# Patient Record
Sex: Female | Born: 1940 | Race: Asian | Hispanic: No | Marital: Married | Smoking: Never smoker
Health system: Southern US, Community
[De-identification: ages and names within clinical notes are randomized; demographics above are authoritative.]

## PROBLEM LIST (undated history)

## (undated) DIAGNOSIS — D649 Anemia, unspecified: Secondary | ICD-10-CM

## (undated) DIAGNOSIS — Z95810 Presence of automatic (implantable) cardiac defibrillator: Secondary | ICD-10-CM

## (undated) DIAGNOSIS — I2510 Atherosclerotic heart disease of native coronary artery without angina pectoris: Secondary | ICD-10-CM

## (undated) DIAGNOSIS — I219 Acute myocardial infarction, unspecified: Secondary | ICD-10-CM

## (undated) DIAGNOSIS — I255 Ischemic cardiomyopathy: Secondary | ICD-10-CM

## (undated) DIAGNOSIS — T82198A Other mechanical complication of other cardiac electronic device, initial encounter: Principal | ICD-10-CM

## (undated) DIAGNOSIS — Z9289 Personal history of other medical treatment: Secondary | ICD-10-CM

## (undated) DIAGNOSIS — I471 Supraventricular tachycardia, unspecified: Secondary | ICD-10-CM

## (undated) DIAGNOSIS — K219 Gastro-esophageal reflux disease without esophagitis: Secondary | ICD-10-CM

## (undated) DIAGNOSIS — E785 Hyperlipidemia, unspecified: Secondary | ICD-10-CM

## (undated) DIAGNOSIS — I5022 Chronic systolic (congestive) heart failure: Secondary | ICD-10-CM

## (undated) HISTORY — DX: Anemia, unspecified: D64.9

## (undated) HISTORY — DX: Ischemic cardiomyopathy: I25.5

## (undated) HISTORY — PX: CARDIAC DEFIBRILLATOR PLACEMENT: SHX171

## (undated) HISTORY — PX: CATARACT EXTRACTION W/ INTRAOCULAR LENS  IMPLANT, BILATERAL: SHX1307

## (undated) HISTORY — DX: Supraventricular tachycardia, unspecified: I47.10

## (undated) HISTORY — DX: Other mechanical complication of other cardiac electronic device, initial encounter: T82.198A

## (undated) HISTORY — DX: Chronic systolic (congestive) heart failure: I50.22

## (undated) HISTORY — DX: Supraventricular tachycardia: I47.1

## (undated) HISTORY — DX: Hyperlipidemia, unspecified: E78.5

---

## 2003-01-15 ENCOUNTER — Ambulatory Visit (HOSPITAL_COMMUNITY): Admission: RE | Admit: 2003-01-15 | Discharge: 2003-01-15 | Payer: Self-pay | Admitting: Family Medicine

## 2004-02-18 ENCOUNTER — Inpatient Hospital Stay (HOSPITAL_COMMUNITY): Admission: AC | Admit: 2004-02-18 | Discharge: 2004-02-29 | Payer: Self-pay | Admitting: Cardiology

## 2004-02-18 DIAGNOSIS — I219 Acute myocardial infarction, unspecified: Secondary | ICD-10-CM

## 2004-02-18 HISTORY — DX: Acute myocardial infarction, unspecified: I21.9

## 2004-02-18 HISTORY — PX: CORONARY ANGIOPLASTY: SHX604

## 2004-02-21 ENCOUNTER — Encounter: Payer: Self-pay | Admitting: Cardiology

## 2004-03-05 ENCOUNTER — Emergency Department (HOSPITAL_COMMUNITY): Admission: EM | Admit: 2004-03-05 | Discharge: 2004-03-06 | Payer: Self-pay | Admitting: Emergency Medicine

## 2004-06-24 ENCOUNTER — Inpatient Hospital Stay (HOSPITAL_COMMUNITY): Admission: RE | Admit: 2004-06-24 | Discharge: 2004-06-25 | Payer: Self-pay | Admitting: Internal Medicine

## 2004-06-24 DIAGNOSIS — Z95810 Presence of automatic (implantable) cardiac defibrillator: Secondary | ICD-10-CM

## 2004-06-24 HISTORY — DX: Presence of automatic (implantable) cardiac defibrillator: Z95.810

## 2004-08-11 ENCOUNTER — Ambulatory Visit: Payer: Self-pay | Admitting: Internal Medicine

## 2004-09-09 ENCOUNTER — Ambulatory Visit: Payer: Self-pay

## 2005-01-25 ENCOUNTER — Ambulatory Visit: Payer: Self-pay | Admitting: Internal Medicine

## 2005-03-29 ENCOUNTER — Ambulatory Visit (HOSPITAL_COMMUNITY): Admission: RE | Admit: 2005-03-29 | Discharge: 2005-03-29 | Payer: Self-pay | Admitting: Internal Medicine

## 2005-06-18 ENCOUNTER — Ambulatory Visit: Payer: Self-pay | Admitting: Cardiology

## 2005-06-28 ENCOUNTER — Ambulatory Visit: Payer: Self-pay | Admitting: Internal Medicine

## 2005-07-14 ENCOUNTER — Ambulatory Visit: Payer: Self-pay

## 2006-01-20 ENCOUNTER — Ambulatory Visit: Payer: Self-pay

## 2006-04-26 ENCOUNTER — Ambulatory Visit: Payer: Self-pay | Admitting: Internal Medicine

## 2006-07-04 ENCOUNTER — Ambulatory Visit: Payer: Self-pay | Admitting: Internal Medicine

## 2006-07-19 ENCOUNTER — Ambulatory Visit: Payer: Self-pay

## 2006-08-19 ENCOUNTER — Ambulatory Visit: Payer: Self-pay

## 2006-09-09 ENCOUNTER — Ambulatory Visit: Payer: Self-pay | Admitting: Internal Medicine

## 2007-04-11 ENCOUNTER — Ambulatory Visit: Payer: Self-pay | Admitting: Internal Medicine

## 2007-04-11 ENCOUNTER — Ambulatory Visit: Payer: Self-pay

## 2007-05-18 ENCOUNTER — Ambulatory Visit: Payer: Self-pay | Admitting: Internal Medicine

## 2007-05-18 LAB — CONVERTED CEMR LAB
AST: 19 units/L (ref 0–37)
Albumin: 3.9 g/dL (ref 3.5–5.2)
Basophils Absolute: 0.1 10*3/uL (ref 0.0–0.1)
Basophils Relative: 1.3 % — ABNORMAL HIGH (ref 0.0–1.0)
Bilirubin, Direct: 0.1 mg/dL (ref 0.0–0.3)
CO2: 28 meq/L (ref 19–32)
Calcium: 10.2 mg/dL (ref 8.4–10.5)
Chloride: 100 meq/L (ref 96–112)
Chloride: 100 meq/L (ref 96–112)
Eosinophils Relative: 1.2 % (ref 0.0–5.0)
GFR calc Af Amer: 93 mL/min
GFR calc Af Amer: 93 mL/min
GFR calc non Af Amer: 77 mL/min
Glucose, Bld: 332 mg/dL — ABNORMAL HIGH (ref 70–99)
Glucose, Bld: 332 mg/dL — ABNORMAL HIGH (ref 70–99)
MCHC: 34.3 g/dL (ref 30.0–36.0)
MCV: 82.2 fL (ref 78.0–100.0)
MCV: 82.2 fL (ref 78.0–100.0)
Monocytes Relative: 6.1 % (ref 3.0–11.0)
Monocytes Relative: 6.1 % (ref 3.0–11.0)
Neutro Abs: 3.2 10*3/uL (ref 1.4–7.7)
Neutrophils Relative %: 57.5 % (ref 43.0–77.0)
Platelets: 290 10*3/uL (ref 150–400)
Potassium: 4.4 meq/L (ref 3.5–5.1)
RBC: 4.73 M/uL (ref 3.87–5.11)
RBC: 4.73 M/uL (ref 3.87–5.11)
RDW: 11.9 % (ref 11.5–14.6)
Sodium: 135 meq/L (ref 135–145)
TSH: 2.14 microintl units/mL (ref 0.35–5.50)
Total Protein: 6.8 g/dL (ref 6.0–8.3)

## 2007-07-31 ENCOUNTER — Ambulatory Visit: Payer: Self-pay | Admitting: Internal Medicine

## 2007-07-31 LAB — CONVERTED CEMR LAB
AST: 17 units/L (ref 0–37)
Bilirubin, Direct: 0.1 mg/dL (ref 0.0–0.3)
Cholesterol: 197 mg/dL (ref 0–200)
Direct LDL: 122.2 mg/dL
HDL: 29 mg/dL — ABNORMAL LOW (ref >39.0)
Total Bilirubin: 0.6 mg/dL (ref 0.3–1.2)
Total Protein: 6.7 g/dL (ref 6.0–8.3)
VLDL: 75 mg/dL — ABNORMAL HIGH (ref 0–40)

## 2007-10-30 ENCOUNTER — Ambulatory Visit: Payer: Self-pay | Admitting: Internal Medicine

## 2007-11-03 ENCOUNTER — Ambulatory Visit: Payer: Self-pay

## 2007-12-14 ENCOUNTER — Ambulatory Visit (HOSPITAL_COMMUNITY): Admission: RE | Admit: 2007-12-14 | Discharge: 2007-12-14 | Payer: Self-pay | Admitting: Ophthalmology

## 2008-01-11 ENCOUNTER — Ambulatory Visit (HOSPITAL_COMMUNITY): Admission: RE | Admit: 2008-01-11 | Discharge: 2008-01-11 | Payer: Self-pay | Admitting: Ophthalmology

## 2008-01-17 ENCOUNTER — Ambulatory Visit: Payer: Self-pay

## 2008-04-23 ENCOUNTER — Ambulatory Visit: Payer: Self-pay | Admitting: Internal Medicine

## 2008-10-04 ENCOUNTER — Ambulatory Visit: Payer: Self-pay | Admitting: Internal Medicine

## 2008-10-04 LAB — CONVERTED CEMR LAB
Basophils Absolute: 0.1 10*3/uL (ref 0.0–0.1)
Eosinophils Absolute: 0.1 10*3/uL (ref 0.0–0.7)
MCHC: 33.9 g/dL (ref 30.0–36.0)
Monocytes Absolute: 0.4 10*3/uL (ref 0.1–1.0)
Monocytes Relative: 7.9 % (ref 3.0–12.0)
Neutro Abs: 2.2 10*3/uL (ref 1.4–7.7)
Neutrophils Relative %: 44.2 % (ref 43.0–77.0)
RDW: 13 % (ref 11.5–14.6)

## 2008-12-11 ENCOUNTER — Encounter: Payer: Self-pay | Admitting: Internal Medicine

## 2008-12-23 ENCOUNTER — Ambulatory Visit: Payer: Self-pay

## 2009-03-26 ENCOUNTER — Encounter (INDEPENDENT_AMBULATORY_CARE_PROVIDER_SITE_OTHER): Payer: Self-pay | Admitting: *Deleted

## 2009-04-30 ENCOUNTER — Ambulatory Visit: Payer: Self-pay

## 2009-07-30 ENCOUNTER — Ambulatory Visit: Payer: Self-pay

## 2009-08-21 ENCOUNTER — Ambulatory Visit: Payer: Self-pay

## 2009-09-03 ENCOUNTER — Telehealth (INDEPENDENT_AMBULATORY_CARE_PROVIDER_SITE_OTHER): Payer: Self-pay | Admitting: *Deleted

## 2009-09-05 ENCOUNTER — Ambulatory Visit (HOSPITAL_COMMUNITY): Admission: RE | Admit: 2009-09-05 | Discharge: 2009-09-05 | Payer: Self-pay | Admitting: Orthopaedic Surgery

## 2009-09-17 ENCOUNTER — Telehealth: Payer: Self-pay | Admitting: Internal Medicine

## 2010-02-03 ENCOUNTER — Ambulatory Visit: Payer: Self-pay | Admitting: Internal Medicine

## 2010-02-03 DIAGNOSIS — I2589 Other forms of chronic ischemic heart disease: Secondary | ICD-10-CM

## 2010-02-03 DIAGNOSIS — I471 Supraventricular tachycardia: Secondary | ICD-10-CM

## 2010-02-19 ENCOUNTER — Ambulatory Visit (HOSPITAL_COMMUNITY): Admission: RE | Admit: 2010-02-19 | Discharge: 2010-02-19 | Payer: Self-pay | Admitting: Orthopaedic Surgery

## 2010-02-19 HISTORY — PX: KNEE ARTHROSCOPY: SHX127

## 2010-04-22 ENCOUNTER — Ambulatory Visit: Payer: Self-pay

## 2010-07-15 ENCOUNTER — Encounter: Payer: Self-pay | Admitting: Internal Medicine

## 2010-08-10 ENCOUNTER — Telehealth: Payer: Self-pay | Admitting: Internal Medicine

## 2010-09-15 ENCOUNTER — Ambulatory Visit: Payer: Self-pay | Admitting: Internal Medicine

## 2010-11-08 ENCOUNTER — Encounter: Payer: Self-pay | Admitting: Orthopaedic Surgery

## 2010-11-17 NOTE — Assessment & Plan Note (Signed)
Summary: pc2 medtronic      Allergies Added: NKDA  CC:  pacer check.  Medtronic/Pt is not sure what medication she is taking.  Family with her today say that she needs infor on what medication she should be taking each dayl.  History of Present Illness:  [name] is seen in followup for ischemic cardiomyopathy. She is status post ICD for [facility] prevention. She has had no intercurrent shocks.  She denies chest pain or shortness of breath. She has her chronic level of fatigue and chronic headaches.  There has been no nocturnal dyspnea or peripheral edema. Shehas had no tachycardia palpitations.  Current Medications (verified): 1)  Omeprazole 20 Mg Cpdr (Omeprazole) .... Take One Capsule By Mouth Two Times A Day 2)  Metoprolol Succinate 25 Mg Xr24h-Tab (Metoprolol Succinate) .... One Half By Mouth Daily 3)  Carvedilol 3.125 Mg Tabs (Carvedilol) .... One By Mouth Two Times A Day 4)  Plavix 75 Mg Tabs (Clopidogrel Bisulfate) .... One By Mouth Daily 5)  Glyburide-Metformin 5-500 Mg Tabs (Glyburide-Metformin) .... One By Mouth Two Times A Day 6)  Amoxicillin 500 Mg Caps (Amoxicillin) .... One By Mouth Three Times A Day For 10 Days 7)  Fexofenadine Hcl 180 Mg Tabs (Fexofenadine Hcl) .... One By Mouth Daily 8)  Propoxyphene N-Apap 100-650 Mg Tabs (Propoxyphene N-Apap) .... 1/2-1 By Mouth  Two Times A Day As Needed For 20 Days 9)  Lisinopril 2.5 Mg Tabs (Lisinopril) .... One By Mouth Daily For 30 Days 10)  Spironolactone 25 Mg Tabs (Spironolactone) .... Pt Not Sure If She Is Taking 11)  Metoprolol Tartrate 50 Mg Tabs (Metoprolol Tartrate) .... Pt Not Sure If She Is Taking  Allergies (verified): No Known Drug Allergies  Past History:  Social History: Last updated: 02/03/2010 lives in [place] with her family.  She does not smoke,  She denies alcohol use.  Past Medical History: acute anterolateral wall myocardial infarction-2005 SVT AICD-Medtronic Maximo VR  Past Surgical  History: Medtronic Maximo VR-Implantation  Social History: lives in [place] with her family.  She does not smoke,  She denies alcohol use.  Vital Signs:  Patient profile:   70 year old female Height:      62 inches Weight:      124 pounds BMI:     22.76 Pulse rate:   80 / minute Pulse rhythm:   regular BP sitting:   118 / 64  (left arm) Cuff size:   regular  Vitals Entered By: Judithe Modest CMA (February 03, 2010 12:22 PM)  Physical Exam  General:  The patient was alert and oriented in no acute distress. HEENT Normal.  Neck veins were flat, carotids were brisk.  Lungs were clear.  Heart sounds were regular without murmurs or gallops.  Abdomen was soft with active bowel sounds. There is no clubbing cyanosis or edema. Skin Warm and dry    Cardiac Cath  Procedure date:  02/18/2004  Findings:       CONCLUSION:  1. Acute anterior wall myocardial infarction with total occlusion of the     ostium of the left anterior descending artery with successful reperfusion     by primary PCI without adjunctive stent implantation because of location.  2. Percutaneous transluminal coronary angioplasty of the mid left anterior     descending artery after ostial reperfusion.  3. Significant left ventricular dysfunction.  4. Post procedural hypotension requiring intraaortic balloon counter     pulsation and dopamine support.   DISPOSITION:  The patient  presented with an anterior wall myocardial  infarction.  Following initial angiography, the blood pressure was reduced.  Successful reperfusion was achieved of an ostial lesion.  Because of the  take off of the circumflex it was felt that the ostium was not ideal for  percutaneous stent implantation.  Intraaortic balloon counter pulsation was  initiated to support blood pressure.  Our current plan is to consider  restudying the patient at 72 to 96 hours depending upon the findings and  potentially consider revascularization surgery if there  remains  significant disease at the ostium of the right coronary.  We will also  assess the LAD at that time.  I have tried to explain the situation to the  patient's family in detail, but it is somewhat complicated because of  language issues.  Several ancillary support staff have also tried to explain  this.                                               Arturo Morton. Riley Kill, M.D. [facility]  Echocardiogram  Procedure date:  02/21/2004  Findings:        SUMMARY   -  The left ventricle was mildly dilated. Overall left ventricular         systolic function was moderately to markedly decreased. Left         ventricular ejection fraction was estimated , range being 30         % to 35 %.. Akinesis of the mid-distal anteroseptal wall and         apex.   -  There was mild mitral annular calcification. There was mild         mitral valvular regurgitation.   -  There was mild right ventricular hypertrophy.   -  There was a trivial pericardial effusion posterior to the heart.    ---------------------------------------------------------------   Prepared and Electronically Authenticated by   Brent Bing M.D.   ICD Specifications Following MD:  Sherryl Manges, MD     ICD Vendor:  Medtronic     ICD Model Number:  7232     ICD Serial Number:  [id number] H ICD DOI:  06/24/2004     ICD Implanting MD:  Sherryl Manges, MD  Lead 1:    Location: RV     DOI: 06/24/2004     Model #: [id number]     Serial #: [id number]     Status: active  Indications::  ICM   ICD Follow Up Remote Check?  No Battery Voltage:  3.03 V     Charge Time:  7.74 seconds     Underlying rhythm:  SR ICD Dependent:  No       ICD Device Measurements Right Ventricle:  Amplitude: 10.4 mV, Impedance: 496 ohms, Threshold: 0.5 V at 0.4 msec Shock Impedance: 51/60 ohms   Episodes Coumadin:  No Shock:  0     ATP:  6     Nonsustained:  30     Ventricular Pacing:  <0.1%  Brady Parameters Mode VVI     Lower Rate Limit:  40      Tachy  Zones VF:  200     VT:  250 FVT VIA VF     VT1:  162     Tech Comments:  Checked by industry.  6 episodes of VT terminated with ATP.  Significant uptake since last interrogation.  No changes made today.  VT episodes correspond with when night heart rate was higher than day heart rate on cardiac compass. Question medication compliance during this time.  Plan for VT per Dr Graciela Husbands. Gypsy Balsam RN BSN  February 03, 2010 12:32 PM   Impression & Recommendations:  Problem # 1:  CARDIOMYOPATHY, ISCHEMIC (ICD-414.8) Her medications were reviewed. She'll be continued on Plavix lisinopril. Carvedilol will be refilled and her metoprolol will be discontinued. The following medications were removed from the medication list:    Cozaar 50 Mg Tabs (Losartan potassium) ..... One by mouth daily Her updated medication list for this problem includes:    Metoprolol Succinate 25 Mg Xr24h-tab (Metoprolol succinate) ..... One half by mouth daily    Carvedilol 3.125 Mg Tabs (Carvedilol) ..... One by mouth two times a day    Plavix 75 Mg Tabs (Clopidogrel bisulfate) ..... One by mouth daily    Lisinopril 2.5 Mg Tabs (Lisinopril) ..... One by mouth daily for 30 days  Problem # 2:  IMPLANTATION OF DEFIBRILLATOR, MEDTRONIC MAXIMO VR (ICD-V45.02) Device parameters and data were reviewed and no changes were made  Problem # 3:  SVT/ PSVT/ PAT (ICD-427.0) the patient had recurrent episodes of tachycardia. These were terminated with antitachycardia pacing. Some of them had therapy was held because of wavelet. Wavelet  was inactivated. Internal electrogram were most consistent with SVT.  Patient Instructions: 1)  Your physician wants you to follow-up in: 12 months with Dr Graciela Husbands.  You will receive a reminder letter in the mail two months in advance. If you don't receive a letter, please call our office to schedule the follow-up appointment. Prescriptions: LISINOPRIL 2.5 MG TABS (LISINOPRIL) one by mouth daily for 30 days  #30 x  11   Entered by:   Optometrist BSN   Authorized by:   Nathen May, MD, [facility]   Signed by:   Gypsy Balsam RN BSN on 02/03/2010   Method used:   Tax adviser to        [facility]* (retail)       [address]       [place], [place]  [address]       Ph: [phone] or [phone]       Fax: [phone]   RxID:   [id number] PLAVIX 75 MG TABS (CLOPIDOGREL BISULFATE) one by mouth daily  #30 x 11   Entered by:   Optometrist BSN   Authorized by:   Nathen May, MD, [facility]   Signed by:   Gypsy Balsam RN BSN on 02/03/2010   Method used:   Tax adviser to        [facility]* (retail)       [address]       [place], [place]  [address]       Ph: [phone] or [phone]       Fax: [phone]   RxID:   [id number] CARVEDILOL 3.125 MG TABS (CARVEDILOL) one by mouth two times a day  #60 x 11   Entered by:   Optometrist BSN   Authorized by:   Nathen May, MD, [facility]   Signed by:   Gypsy Balsam RN BSN on 02/03/2010   Method used:   Tax adviser to        [facility]* (retail)  [address]       [place], [place]  [address]       Ph: [phone] or [phone]       Fax: [phone]   RxID:   [id number]

## 2010-11-17 NOTE — Assessment & Plan Note (Signed)
Summary: device/saf  Medications Added VOLTAREN 1 % GEL (DICLOFENAC SODIUM) once daily , as needed      Allergies Added: NKDA  Visit Type:  ICD-Metronic   History of Present Illness:  [name] is seen in followup for ischemic cardiomyopathy. She is status post ICD for primary  prevention. She has had no intercurrent shocks.  She denies chest pain or shortness of breath. She has her chronic level of fatigue and chronic headaches.  There has been no nocturnal dyspnea or peripheral edema. Shehas had no tachycardia palpitations.  Problems Prior to Update: 1)  Svt/ Psvt/ Pat  (ICD-427.0) 2)  Cardiomyopathy, Ischemic  (ICD-414.8) 3)  Implantation of Defibrillator, Medtronic Maximo Vr  (ICD-V45.02)  Current Medications (verified): 1)  Omeprazole 20 Mg Cpdr (Omeprazole) .... Take One Capsule By Mouth Two Times A Day 2)  Carvedilol 3.125 Mg Tabs (Carvedilol) .... One By Mouth Two Times A Day 3)  Plavix 75 Mg Tabs (Clopidogrel Bisulfate) .... One By Mouth Daily 4)  Lisinopril 2.5 Mg Tabs (Lisinopril) .... One By Mouth Daily For 30 Days 5)  Voltaren 1 % Gel (Diclofenac Sodium) .... Once Daily , As Needed  Allergies (verified): No Known Drug Allergies  Past History:  Past Medical History: Last updated: 02/03/2010 acute anterolateral wall myocardial infarction-2005 SVT AICD-Medtronic Maximo VR  Past Surgical History: Last updated: 02/03/2010 Medtronic Maximo VR-Implantation  Social History: Last updated: 02/03/2010 lives in [place] with her family.  She does not smoke,  She denies alcohol use.  Vital Signs:  Patient profile:   70 year old female Height:      62 inches Weight:      129 pounds BMI:     23.68 Pulse rate:   66 / minute BP sitting:   136 / 67  (left arm) Cuff size:   regular  Vitals Entered By: Caralee Ates CMA (September 15, 2010 11:12 AM)  Physical Exam  General:  The patient was alert and oriented in no acute distress. HEENT Normal.  Neck veins were  flat, carotids were brisk.  Lungs were clear.  Heart sounds were regular without murmurs or gallops.  Abdomen was soft with active bowel sounds. There is no clubbing cyanosis or edema. Skin Warm and dry     ICD Specifications Following MD:  Sherryl Manges, MD     ICD Vendor:  Medtronic     ICD Model Number:  7232     ICD Serial Number:  [id number] ICD DOI:  06/24/2004     ICD Implanting MD:  Sherryl Manges, MD  Lead 1:    Location: RV     DOI: 06/24/2004     Model #: 6213     Serial #: [id number]     Status: active  Indications::  ICM   ICD Follow Up Battery Voltage:  3.02 V     Charge Time:  7.82 seconds     Underlying rhythm:  SR ICD Dependent:  No       ICD Device Measurements Right Ventricle:  Amplitude: 12.8 mV, Impedance: 520 ohms, Threshold: 1.0 V at 0.1 msec Shock Impedance: 56/74 ohms   Episodes MS Episodes:  0     Coumadin:  No Shock:  0     ATP:  7     Nonsustained:  19     Atrial Therapies:  0 Ventricular Pacing:  <0.1%  Brady Parameters Mode VVI     Lower Rate Limit:  40      Tachy Zones VF:  200     VT:  250 FVT VIA VF     VT1:  162     Next Cardiology Appt Due:  11/18/2010 Tech Comments:  7 VT EPISODES--ALL SUCCESSFULLY ATP THERAPY. 0454 LEAD STABLE. SIC 0.  NORMAL DEVICE FUNCTION.  NO CHANGES MADE. DEMONSTRATED TONES FOR PT AND UNDERSTANDS TO CALL IF HEARS THE TONE. ROV IN 3 MTHS W/DEVICE CLINIC. [name]  September 15, 2010 11:20 AM  Impression & Recommendations:  Problem # 1:  SVT/ PSVT/ PAT (ICD-427.0) The patient has had recurrent tachycardia. It is not clear whether this represents SVT or ventricular tachycardia. My eyes suggests the former; the device is calling it a latter. We will continue her on current medication The following medications were removed from the medication list:    Metoprolol Succinate 25 Mg Xr24h-tab (Metoprolol succinate) ..... One half by mouth daily Her updated medication list for this problem includes:    Carvedilol 3.125 Mg  Tabs (Carvedilol) ..... One by mouth two times a day    Plavix 75 Mg Tabs (Clopidogrel bisulfate) ..... One by mouth daily    Lisinopril 2.5 Mg Tabs (Lisinopril) ..... One by mouth daily for 30 days  Problem # 2:  CARDIOMYOPATHY, ISCHEMIC (ICD-414.8) swithout symptoms. We will contact her primary care physician as to understand why she is not on statin therapy  Problem # 3:  IMPLANTATION OF DEFIBRILLATOR, MEDTRONIC MAXIMO VR (ICD-V45.02) Device parameters and data were reviewed and no changes were made  Patient Instructions: 1)  Your physician recommends that you schedule a follow-up appointment in: 3 Months with [facility] 2)  Your physician recommends that you continue on your current medications as directed. Please refer to the Current Medication list given to you today. 3)  Your physician wants you to follow-up in: 1 year with Dr. Graciela Husbands.  You will receive a reminder letter in the mail two months in advance. If you don't receive a letter, please call our office to schedule the follow-up appointment.  Appended Document: [facility]      Allergies: No Known Drug Allergies    ICD Specifications Following MD:  Sherryl Manges, MD     ICD Vendor:  Medtronic     ICD Model Number:  7232     ICD Serial Number:  [id number] ICD DOI:  06/24/2004     ICD Implanting MD:  Sherryl Manges, MD  Lead 1:    Location: RV     DOI: 06/24/2004     Model #: [id number]     Serial #: [id number]     Status: active  Indications::  ICM   ICD Follow Up ICD Dependent:  No      Episodes Coumadin:  No  Brady Parameters Mode VVI     Lower Rate Limit:  40      Tachy Zones VF:  200     VT:  250 FVT VIA VF     VT1:  162     Impression & Recommendations:  Problem # 1:  SPRINT FIDELIS LEAD (ICD-996.04) we reviewed again the tones and instructions in the event that she hears beeping related to possible fracture of her lead

## 2010-11-17 NOTE — Procedures (Signed)
Summary: defib check.[email or web address].amber      Allergies Added: NKDA  Current Medications (verified): 1)  Omeprazole 20 Mg Cpdr (Omeprazole) .... Take One Capsule By Mouth Two Times A Day 2)  Metoprolol Succinate 25 Mg Xr24h-Tab (Metoprolol Succinate) .... One Half By Mouth Daily 3)  Carvedilol 3.125 Mg Tabs (Carvedilol) .... One By Mouth Two Times A Day 4)  Plavix 75 Mg Tabs (Clopidogrel Bisulfate) .... One By Mouth Daily 5)  Fexofenadine Hcl 180 Mg Tabs (Fexofenadine Hcl) .... One By Mouth Daily 6)  Lisinopril 2.5 Mg Tabs (Lisinopril) .... One By Mouth Daily For 30 Days  Allergies (verified): No Known Drug Allergies   ICD Specifications Following MD:  Sherryl Manges, MD     ICD Vendor:  Medtronic     ICD Model Number:  7232     ICD Serial Number:  [id number] H ICD DOI:  06/24/2004     ICD Implanting MD:  Sherryl Manges, MD  Lead 1:    Location: RV     DOI: 06/24/2004     Model #: 4098     Serial #: [id number]     Status: active  Indications::  ICM   ICD Follow Up Remote Check?  No Battery Voltage:  3.04 V     Charge Time:  7.74 seconds     Underlying rhythm:  SR ICD Dependent:  No       ICD Device Measurements Right Ventricle:  Amplitude: 12.2 mV, Impedance: 496 ohms, Threshold: 1.0 V at 0.2 msec Shock Impedance: 51/59 ohms   Episodes Coumadin:  No Shock:  0     ATP:  0     Nonsustained:  12     Ventricular Pacing:  <0.1%  Brady Parameters Mode VVI     Lower Rate Limit:  40      Tachy Zones VF:  200     VT:  250 FVT VIA VF     VT1:  162     Tech Comments:  No parameter changes.  Device function normal.  6949 lead stable, SIC  0.   ROV 3 months with Dr. Graciela Husbands. Altha Harm, LPN  April 22, 5620 12:46 PM

## 2010-11-17 NOTE — Progress Notes (Signed)
Summary: stop plavix- need to talk to dr. Graciela Husbands   Phone Note From Other Clinic   Caller: nurse ann Summary of Call: per Francee Gentile [phone] pt to have a colonoscopy on Nov 10th. Is pt ok to stop plavix prior to procedure. Fax [phone] Initial call taken by: Edman Circle,  August 10, 2010 3:53 PM  Follow-up for Phone Call        pt cleared for colonoscopy per dr. Graciela Husbands. Last cath was 2005 with angioplasty, no PCI. Will fax to GI office. Whitney Maeola Sarah RN  August 10, 2010 5:06 PM  Follow-up by: Whitney Maeola Sarah RN,  August 10, 2010 5:06 PM

## 2010-12-14 ENCOUNTER — Encounter: Payer: Self-pay | Admitting: Internal Medicine

## 2010-12-14 ENCOUNTER — Encounter (INDEPENDENT_AMBULATORY_CARE_PROVIDER_SITE_OTHER): Payer: Medicare Other

## 2010-12-14 DIAGNOSIS — I428 Other cardiomyopathies: Secondary | ICD-10-CM

## 2010-12-24 NOTE — Procedures (Signed)
Summary: device check  mca  Medications Added GABAPENTIN 100 MG CAPS (GABAPENTIN) one by mouth daily SIMVASTATIN 20 MG TABS (SIMVASTATIN) one by mouth daily      Allergies Added: NKDA  Current Medications (verified): 1)  Omeprazole 20 Mg Cpdr (Omeprazole) .... Take One Capsule By Mouth Two Times A Day 2)  Carvedilol 3.125 Mg Tabs (Carvedilol) .... One By Mouth Two Times A Day 3)  Plavix 75 Mg Tabs (Clopidogrel Bisulfate) .... One By Mouth Daily 4)  Lisinopril 2.5 Mg Tabs (Lisinopril) .... One By Mouth Daily For 30 Days 5)  Voltaren 1 % Gel (Diclofenac Sodium) .... Once Daily , As Needed 6)  Gabapentin 100 Mg Caps (Gabapentin) .... One By Mouth Daily 7)  Simvastatin 20 Mg Tabs (Simvastatin) .... One By Mouth Daily  Allergies (verified): No Known Drug Allergies   ICD Specifications Following MD:  Sherryl Manges, MD     ICD Vendor:  Medtronic     ICD Model Number:  7232     ICD Serial Number:  [id number] ICD DOI:  06/24/2004     ICD Implanting MD:  Sherryl Manges, MD  Lead 1:    Location: RV     DOI: 06/24/2004     Model #: 8295     Serial #: [id number]     Status: active  Indications::  ICM   ICD Follow Up ICD Dependent:  No      Episodes Coumadin:  No  Brady Parameters Mode VVI     Lower Rate Limit:  40      Tachy Zones VF:  200     VT:  250 FVT VIA VF     VT1:  162     Tech Comments:  See PaceArt

## 2011-01-05 LAB — COMPREHENSIVE METABOLIC PANEL
ALT: 12 U/L (ref 0–35)
AST: 18 U/L (ref 0–37)
Calcium: 9.3 mg/dL (ref 8.4–10.5)
Creatinine, Ser: 0.66 mg/dL (ref 0.4–1.2)
GFR calc Af Amer: >60 mL/min (ref >60)
GFR calc non Af Amer: >60 mL/min (ref >60)
Glucose, Bld: 101 mg/dL — ABNORMAL HIGH (ref 70–99)
Potassium: 3.9 mEq/L (ref 3.5–5.1)

## 2011-01-05 LAB — URINALYSIS, ROUTINE W REFLEX MICROSCOPIC
Glucose, UA: NEGATIVE mg/dL
Ketones, ur: NEGATIVE mg/dL
Leukocytes, UA: NEGATIVE
Specific Gravity, Urine: 1.02 (ref 1.005–1.030)
Urobilinogen, UA: 0.2 mg/dL (ref 0.0–1.0)

## 2011-01-05 LAB — CBC
HCT: 33.9 % — ABNORMAL LOW (ref 36.0–46.0)
Platelets: 225 10*3/uL (ref 150–400)
WBC: 4.6 10*3/uL (ref 4.0–10.5)

## 2011-01-05 LAB — DIFFERENTIAL
Basophils Absolute: 0.1 10*3/uL (ref 0.0–0.1)
Eosinophils Relative: 2 % (ref 0–5)
Lymphs Abs: 1.9 10*3/uL (ref 0.7–4.0)
Monocytes Absolute: 0.3 10*3/uL (ref 0.1–1.0)
Monocytes Relative: 7 % (ref 3–12)

## 2011-01-05 LAB — URINE MICROSCOPIC-ADD ON

## 2011-01-05 LAB — GLUCOSE, CAPILLARY: Glucose-Capillary: 103 mg/dL — ABNORMAL HIGH (ref 70–99)

## 2011-01-14 NOTE — Cardiovascular Report (Signed)
Summary: Office Visit   Office Visit   Imported By: Roderic Ovens 01/05/2011 09:10:32  _____________________________________________________________________  External Attachment:    Type:   Image     Comment:   External Document

## 2011-02-17 ENCOUNTER — Other Ambulatory Visit: Payer: Self-pay | Admitting: *Deleted

## 2011-02-17 MED ORDER — CARVEDILOL 3.125 MG PO TABS: 3.1250 mg | ORAL_TABLET | Freq: Two times a day (BID) | ORAL | Status: DC

## 2011-02-17 MED ORDER — CLOPIDOGREL BISULFATE 75 MG PO TABS: 75.0000 mg | ORAL_TABLET | Freq: Every day | ORAL | Status: DC

## 2011-03-02 NOTE — Assessment & Plan Note (Signed)
[facility]                         ELECTROPHYSIOLOGY OFFICE NOTE   NAME:[name]                    MRN:          [id number]  DATE:10/04/2008                            DOB:          [birth year]    [name] was seen in followup for an ICD implanted for ischemic  cardiomyopathy for primary prevention.   She has had no intercurrent shocks.   She did have an intercurrent episode of tachycardia that was terminated  with antitachycardia pacing.  By morphology criteria, it was felt to be  VT; by electrogram analysis, it looks quite consistent with SVT.   MEDICATIONS:  1. Carvedilol 3.125.  2. Cozaar.  3. Plavix.  4. Omeprazole.  5. Metoprolol.  6. Glyburide.   PHYSICAL EXAMINATION:  VITAL SIGNS:  Blood pressure 126/73, her pulse  was 72.  LUNGS:  Clear.  HEART:  Sounds were regular with a 2/6 murmur heard at the right upper  sternal border.  EXTREMITIES:  Without edema.   We will plan to see her again in 3 months' time.  Her 6949 lead is  stable.  She was concerned about her hemoglobin, so we will check that  today.  We have given her a handicap sticker as well.     Duke Salvia, MD, [facility]  Electronically Signed    SCK/MedQ  DD: 10/04/2008  DT: 10/05/2008  Job #: [id number]

## 2011-03-02 NOTE — Assessment & Plan Note (Signed)
 HEALTHCARE                         ELECTROPHYSIOLOGY OFFICE NOTE   NAME:[name]                    MRN:          [id number]  DATE:05/18/2007                            DOB:          Oct 21, 1940    [name] comes in.  She was having palpitations.  When I last saw her,  we gave her an event recorder that shows nothing associated with her  palpitations.  She continues to complain of fatigue.  This may be  somewhat worse.  She is also complaining of leg cramps.  She does not  have significant daytime somnolence.   MEDICATIONS:  1. Cozaar 50.  2. Plavix.  3. Digitek 0.25.  4. Spironolactone 25.  5. Omeprazole.  6. Toprol 25.  7. Lovastatin 20.   PHYSICAL EXAMINATION:  VITAL SIGNS:  Her blood pressure is 102/72.  Her  pulse is 64.  LUNGS:  Clear.  HEART:  Sounds regular.  EXTREMITIES:  Without edema.  There is some tenderness in her calves  bilaterally.   Interrogation of her Medtronic defibrillator demonstrates an R wave of  14.9 with impedance of 5,  threshold of 0.5 at 0.3.  There were no  intercurrent episodes.   Review of her event recorder demonstrated sinus rhythm associated with  palpitations.   IMPRESSION:  1. Ischemic cardiomyopathy with prior myocardial infarction on medical      therapy with no intervention.  2. Congestive heart failure, chronic, systolic.  3. Myalgias, question related to her lovastatin.  4. Fatigue, question related to her beta-blocker.   [name] is basically stable but she is not thriving.  I wonder to what  degree the medications are contributing.   We will plan to:  1. Discontinue her lovastatin.  2. Change her metoprolol to Coreg 3.125 b.i.d.  3. We will decrease her Digitek at 0.125 mg a day.  4. We will plan to check her CBC because she was previously noted to      be anemic.  5. We will plan to check a C-MET to assess both her LFTs as well as      her kidney function in      the  setting of her cardiomyopathy.  6. We will plan to check a TSH given her fatigue.     Duke Salvia, MD, [facility]  Electronically Signed    SCK/MedQ  DD: 05/18/2007  DT: 05/18/2007  Job #: [id number]   cc:   [name]

## 2011-03-02 NOTE — Assessment & Plan Note (Signed)
[facility]                         ELECTROPHYSIOLOGY OFFICE NOTE   NAME:Lamarque, York Grice                    MRN:          [id number]  DATE:04/11/2007                            DOB:          Mar 09, 1941    Ms. Heather comes in.  She has ischemic heart disease, is status post  primary prevention ICD.   She has episodes where she gets short of breath and are accompanied by  palpitations but not accompanied by lightheadedness.  They last a couple  of minutes and then abate.   She was also put on a statin therapy which resulted in a diffuse rash.  She was given a steroid injection.  Most of this has resolved except on  her right leg.   MEDICATIONS:  1. Toprol, we think it is 25 but we are not sure.  2. Spironolactone 25.  3. Cozaar 50.  4. Prilosec.  5. Lovastatin.  6. Digoxin 0.25.   EXAMINATION:  Today, her blood pressure was 102/60, her pulse was 65.  LUNGS:  Clear.  HEART SOUNDS:  Regular.  EXTREMITIES:  Without edema.   Interrogation of her device demonstrated a single chamber Medtronic  defibrillator with impedance 496, threshold 0.5 at 0.3 with an R wave of  13.2.  The high voltage impedance was 56/66, there was no intracardiac  episodes.   IMPRESSION:  1. Ischemic cardiomyopathy.  2. Status post implantable cardioverter defibrillator for number 1.  3. Mild bradycardia.  4. Episodes of shortness of breath accompanied by palpitations.  5. Drug reaction.   I have given her an event recorder to try and clarify the mechanism of  these episodes.   I have asked her to decrease her Digitek in half, plan to increase her  Toprol from 25 to 50.  I have suggested she get some 1% hydrocortisone  cream for her leg and will see her again in 4 weeks time to review the  above.     Duke Salvia, MD, [facility]  Electronically Signed    SCK/MedQ  DD: 04/11/2007  DT: 04/11/2007  Job #: [id number]

## 2011-03-02 NOTE — Assessment & Plan Note (Signed)
[facility]                         ELECTROPHYSIOLOGY OFFICE NOTE   NAME:Burmaster, York Grice                    MRN:          [id number]  DATE:04/23/2008                            DOB:          [birth year]    Ms. Brotherton was seen today in the office for interrogation of her ICD.  Her R-wave is 30 with a paced impedance of 496 with a threshold of 1 V  at 0.2, high voltage impedance was 40 with 53 ohms.  Battery voltage is  3.10.  Her 6949 lead was stable.   I did not see her because I got caught up with other things.  She  expressed no concerns to the staff.   We will see her again in 3 months' time in the Device Clinic.     Duke Salvia, MD, [facility]  Electronically Signed    SCK/MedQ  DD: 04/23/2008  DT: 04/24/2008  Job #: [id number]

## 2011-03-02 NOTE — Assessment & Plan Note (Signed)
[facility]                         ELECTROPHYSIOLOGY OFFICE NOTE   NAME:PATELYork Grice                    MRN:          [id number]  DATE:10/30/2007                            DOB:          [birth year]    Mrs. Larmon comes in with complaints of pain in her chest in her left  arm.  They are not necessarily related to exertion.  She does, however,  have significant ischemic heart disease with a last stress test about 2-  3 years ago demonstrating large areas of infarction, ejection fraction  of 25%, but no significant ischemia.   She otherwise complains of fatigue but no shortness of breath.  No  peripheral edema. We went through her medications, and she has been on  Plavix for 3 years since her infarction at which time she had PTCA as  opposed to stenting.  This will be discontinued.  She is also on  metoprolol as well as carvedilol, and we have discontinued the former.  She is on Cozaar 50 and Digitek has been intercurrently discontinued.  As well, she is also on glyburide and metformin, for her diabetes.   PHYSICAL EXAMINATION:  VITAL SIGNS:  On examination today, her blood  pressure is 106/66.  Her pulse was 62.  LUNGS:  Clear.  HEART:  Heart sounds were regular.  EXTREMITIES:  Without edema.  SKIN:  Skin was warm and dry.   Interrogation of her Medtronic ICD demonstrates an R-wave of 10 with  impedance of 472, a threshold of 0.2. Battery voltage is 3.11.  There  are no intercurrent episodes.   IMPRESSION:  1. Ischemic heart disease.      a.     Status post myocardial infarction.      b.     Status post percutaneous transluminal cardiac angioplasty.      c.     Ejection fraction 26%      d.     Recurrent arm and neck pain.  2. 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  3. Status post implantable cardioverter-defibrillator for the above.  4. Diabetes.   Mrs. Wilds has concerning chest pain symptoms.  We will plan to  check an  adenosine Myoview.   As relates to medications, we  have discontinued her Plavix and  discontinued her metoprolol at this time.   We will see her again in 6 months.     Duke Salvia, MD, [facility]  Electronically Signed    SCK/MedQ  DD: 10/30/2007  DT: 10/30/2007  Job #: [id number]   cc:   Doreen Beam, MD

## 2011-03-02 NOTE — Assessment & Plan Note (Signed)
[facility]                         ELECTROPHYSIOLOGY OFFICE NOTE   NAME:Stenner, York Grice                    MRN:          [id number]  DATE:07/31/2007                            DOB:          06/28/1941    Ms. Rask comes in.  She has a history of ischemic cardiomyopathy, and  her major complaint is fatigue.   She is being followed for diabetes and some recent modifications have  been made there.  I saw her in July, and we tried to mess with her  medicines to see if we could improve symptoms.  She is currently not  taking lovastatin.  Her metoprolol has been discontinued.  She is on  carvedilol.  Neither of these has been associated with any improvement.  She is also on Cozaar, Plavix, Digitek, spironolactone, omeprazole, and  glitazone.  I should note that she has three bottles of Cozaar, and I am  not quite sure why.   PHYSICAL EXAMINATION:  Her blood pressure is 116/62.  Her pulse is 67.  LUNGS:  Clear.  CARDIAC:  Heart sounds were regular.  EXTREMITIES:  Without edema.  SKIN:  Warm and dry.   Electrocardiogram dated today demonstrated sinus rhythm at 57 with  intervals that were normal.  Her ICD demonstrated an R wave of 10  with  an impedance of 464, a threshold of 1 volt at 0.2.  The high voltage  impedance was 50 ohm.  Battery voltage was 3.11.  The [phone] lead was  reprogrammed.   Ms. Verdejo is stable.  I am not sure what her fatigue is.  She will  follow up with Dr. Sherril Croon, and we will see her again in the device clinic  in three months.     Duke Salvia, MD, [facility]  Electronically Signed    SCK/MedQ  DD: 07/31/2007  DT: 08/01/2007  Job #: [id number]   cc:   Doreen Beam

## 2011-03-05 NOTE — Cardiovascular Report (Signed)
[name], [name]                         ACCOUNT NO.:  [id number]   MEDICAL RECORD NO.:  [id number]                   PATIENT TYPE:  INP   LOCATION:  2928                                 FACILITY:  [facility]   PHYSICIAN:  Arturo Morton. Riley Kill, M.D. [facility]         DATE OF BIRTH:  May 24, 1941   DATE OF PROCEDURE:  02/18/2004  DATE OF DISCHARGE:                              CARDIAC CATHETERIZATION   INDICATIONS:  The patient is a 70 year old who presents with an anterior  wall myocardial infarction.  The patient presented to [facility].  She was seen by the emergency room staff, and they contacted Dr. Andee Lineman.  She was transferred urgently to the [facility] Cardiac Catheterization Laboratory.  In the catheterization laboratory, the history was obtained, the procedure  explained by one of the residents on our cardiology service, Flora Lipps.  We then explained the situation also to the family.  The patient had an ST  elevation myocardial infarction and was brought into the catheterization  laboratory promptly.   PROCEDURES:  1. Left heart catheterization.  2. Right heart catheterization.  3. Selective coronary arteriography.  4. Selective left ventriculography.  5. Aortic root aortography.  6. Percutaneous transluminal coronary angioplasty of the ostial left     anterior descending artery and mid left anterior descending.  7. Insertion of an intraaortic balloon pump.   DESCRIPTION OF PROCEDURE:  The patient was brought to the catheterization  laboratory on a heparin and integrilin drip.  She was met in the  catheterization laboratory by Dr. Andee Lineman, myself, and Dr. Wendy Poet.  The  situation was explained to the patient.  She had ongoing chest pain and  evidence of ST elevation by EKG.  After the consent was signed, preparations  were made for cardiac catheterization and primary intervention.  Through an  anterior puncture, the right femoral artery was easily entered.  Views of  the left and  right coronary arteries were obtained.  There appeared to be an  ostial occlusion of the LAD although we looked for evidence of an abnormally  placed LAD briefly because this was a flush occlusion without ostial  staining.  The right coronary artery was then viewed in several angiographic  projections.  There was damping with careful placement of the right coronary  catheter at the ostium suggestive of an ostial lesion.  Additional views of  the right were not obtained specifically because of what appeared total LAD.  Central aortic and left ventricular pressures were then measured with a  pigtail.  Ventriculography was performed in the RAO projection which  revealed anterior wall and apical akinesis with ejection fraction of 40%.  Following this, preparations were made for urgent intervention.  Heparin and  integrilin had been given prior to the patient's arrival and an ACT was  checked and then additional heparin was given to bring the ACT into the  appropriate range.  A JL-4 guiding catheter 7 [place]  was then utilized to  intubate the left main.  A Hi-Torque Floppy wire was then passed to the  ostium of the LAD, and manipulated in this region and then crossed into the  LAD with successful reperfusion initially with a wire.  There was TIMI-1  flow initially.  Following this, 2 mm balloon was then taken up to the site  of the lesion.  There was careful attention paid to make sure that just a  small amount of balloon was hanging into the left main.  The lesion was then  dilated with 2 mm balloon.  We eventually upgraded to a 2.5 mm balloon and  dilated both the ostium and the mid portion of the left anterior descending  artery which itself had a stenosis.  With this, there was reestablishment of  TIMI-3 flow.  The ostium of the LAD actually looked very good and it was  felt that placement of an intracoronary stent would likely compromise the  take off of the circumflex and given the  patient's marginal hemodynamics it  was felt that stenting should be deferred at the present time.  We were able  to reestablish flow in the LAD with TIMI-3 flow without significant  compromise of the LAD eventually using a 2.5 mm balloon.   At the beginning of the procedure, the blood pressure was in the 120-130  range, but after initial angiography and prior to balloon intervention the  pressure was in the 90s.  Following reperfusion therapy, the pressure was in  the mid 80s despite fluid resuscitation.  As a result, I elected to place an  intraaortic balloon pump.  We also placed a Swan-Ganz catheter through the  left femoral vein and up and into the pulmonary artery position.  Right  heart pressures were measured.  The hemodynamics appeared to be stable and  the patient's pain was improved to some extent.  Because of this, she was  taken to the holding area with satisfactory hemodynamics on a low dose  dopamine drip.   HEMODYNAMIC DATA:  1. Central aorta pre angiography 137/83, mean 109.  2. Preangiography left ventricle 121/31.  3. Post angiography aortic pressure 97/68, mean 78.  4. Post angiographic left ventricular pressure 96/23.  5. Post PCI right atrial pressure 11.  6. Post PCI right ventricular pressure 39/14.  7. Post PCI pulmonary artery pressure 36/16, mean 24.  8. Post PCI pulmonary capillary wedge pressure 22.   ANGIOGRAPHIC DATA:  1. Ventriculography was performed in the RAO projection.  There was     reduction in overall systolic function.  Ejection fraction was calculated     at 40%.  There was akinesis of the mid, distal anterolateral wall, apex     and distal inferior segment.  There appeared to be 1-2+ mitral     regurgitation although this was difficult to assess.  2. The left main coronary artery was free of critical disease and a was a     long left main.  3. The LAD was totally occluded at the ostium.  Following reperfusion, there    was a segment of  about 5 or 6 mm right at the ostium where there was a     high grade stenosis.  This was successfully dilated to 20% residual     luminal narrowing.  The remainder of the LAD demonstrated about a 70%     area of narrowing after the diagonal take off.  This was successfully  dilated to less than 30% residual luminal narrowing.  The distal LAD had     an appearance of reduced myocardial blush.  4. There is a small ramus intermedius that was intact.  5. The ostium of the circumflex remained intact even after reperfusion.     There was moderate marginal system that was free of critical disease.  6. The right coronary artery appeared to be widely patent including a     posterior descending and posterolateral.  There was, however, damping at     the ostium and there was suggestion of tapering of the ostium.  We did     not get a complete study of the ostium, however, as the priority was     reperfusion therapy.  This will need to be reassessed at a later date.   CONCLUSION:  1. Acute anterior wall myocardial infarction with total occlusion of the     ostium of the left anterior descending artery with successful reperfusion     by primary PCI without adjunctive stent implantation because of location.  2. Percutaneous transluminal coronary angioplasty of the mid left anterior     descending artery after ostial reperfusion.  3. Significant left ventricular dysfunction.  4. Post procedural hypotension requiring intraaortic balloon counter     pulsation and dopamine support.   DISPOSITION:  The patient presented with an anterior wall myocardial  infarction.  Following initial angiography, the blood pressure was reduced.  Successful reperfusion was achieved of an ostial lesion.  Because of the  take off of the circumflex it was felt that the ostium was not ideal for  percutaneous stent implantation.  Intraaortic balloon counter pulsation was  initiated to support blood pressure.  Our current  plan is to consider  restudying the patient at 72 to 96 hours depending upon the findings and  potentially consider revascularization surgery if there remains  significant disease at the ostium of the right coronary.  We will also  assess the LAD at that time.  I have tried to explain the situation to the  patient's family in detail, but it is somewhat complicated because of  language issues.  Several ancillary support staff have also tried to explain  this.                                               Arturo Morton. Riley Kill, M.D. [facility]    TDS/MEDQ  D:  02/18/2004  T:  02/19/2004  Job:  [id number]   cc:   Learta Codding, M.D. [facility]   Jonelle Sidle, M.D. [facility]

## 2011-03-05 NOTE — Discharge Summary (Signed)
NAMEKYUNG, MUTO                         ACCOUNT NO.:  [id number]   MEDICAL RECORD NO.:  [id number]                   PATIENT TYPE:  INP   LOCATION:  2031                                 FACILITY:  [facility]   PHYSICIAN:  Learta Codding, M.D. [facility]             DATE OF BIRTH:  03-Apr-1941   DATE OF ADMISSION:  02/18/2004  DATE OF DISCHARGE:  02/29/2004                                 DISCHARGE SUMMARY   DISCHARGE DIAGNOSES:  1. Status post acute anterior wall myocardial infarction secondary to ostial     left anterior descending occlusion.     a. Ostial left anterior descending and mid left anterior descending both        treated with percutaneous transluminal coronary angiography.  No        stenting performed secondary to position of the lesion in the left        anterior descending.     b. Residual coronary artery disease:  Probable 50% stenosis in the ostial        right coronary artery.  2. Post myocardial infarction left ventricular dysfunction with an ejection     fraction of 30-35% and akinesis of the mid to distal anteroseptal wall     and apex.  3. Post myocardial infarction cardiogenic shock treated with intra-aortic     balloon pump and pressors.  4. Post intervention and intra-aortic balloon pump insertion for     retroperitoneal bleed.     a. Treated with transfusion with packed red blood cells.     b. Surgical consultation.  No surgery warranted.  5. Elevated liver function tests.     a. Negative abdominal ultrasound on Feb 24, 2004.     b. Hepatitis panel pending.     c. Will most likely need gastroenterology consult as an outpatient if        this persists.     d. Statin discontinued secondary to elevated liver function tests.  6. Borderline fevers.     a. Negative cultures to date.   PROCEDURES PERFORMED THIS ADMISSION:  Cardiac catheterization and  percutaneous coronary intervention by Arturo Morton. Riley Kill, M.D., on Feb 18, 2004, with results as noted above in  discharge diagnoses.   HOSPITAL COURSE:  Please see the dictated history and physical per Dr.  Andee Lineman on Feb 18, 2004.  Briefly, this 70 year old Hindu female was  transferred from [facility] to [facility] with acute  anterior wall myocardial infarction on Feb 18, 2004.  She underwent cardiac  catheterization by Dr. Riley Kill.  Please see his dictated note for complete  details.  She had 1-2+ mitral regurgitation on left ventriculogram.  Her EF  was 40%.  He performed angioplasty of the ostial LAD lesion.  No stenting  was performed secondary to the positioning of the lesion.  There was  angioplasty performed to the mid LAD.  Post intervention, the patient became  hypotensive.  She was placed on pressors and intra-aortic balloon pump.  Shortly after her catheterization, her hemoglobin dropped to 8.5.  CT scan  confirmed a moderate retroperitoneal bleed.  Dr. Edilia Bo of the CVTS service  saw the patient.  He recommended transfusion with packed red blood cells.  He followed the patient and her exam.  He felt no operative intervention was  warranted.  CVTS signed off on Feb 19, 2004.   The patient's dopamine was weaned off.  She continued to have some mild  hypotension.  Her cardiac medications were titrated carefully as her blood  pressure would tolerate.  Statin was added.  She was noted to be volume  overloaded and she was diuresed with IV Lasix.  This caused her blood  pressure to drop more.  On Feb 21, 2004, she was started back on a dopamine  drip.  Her EF was checked with a 2-D echocardiogram.  This was performed on  Feb 21, 2004, and revealed an EF of 30-35% with akinesis of the mid distal  anteroseptal wall and apex.  She had mild MR, mild right ventricular  hypertrophy, and trivial pericardial effusion posterior to the heart.  She  also had mild fevers noted.  Cultures were obtained.  Her blood cultures  were negative x 5 days.  A urine culture was negative.  It was felt  that her  fevers were probably likely secondary to her MI verses her retroperitoneal  bleed.   The patient was noted to have elevated LFTs with an alkaline phosphatase of  184, AST 69, and ALT 73.  A right upper quadrant ultrasound was ordered.  As  noted above, this was negative.  Her LFTs were followed.  It was noted that  her liver function tests were remaining about the same.  Her alkaline  phosphatase eventually increased from 184 to 223.  Her total bilirubin  remained stable around 2.1.  Her AST did go up from 60 to 77 and ALT from 69  to 85.  A hepatitis panel was ordered.  Her statin was discontinued.  At the  time of this dictation, her hepatitis panels were negative.  She may require  a GI consult as an outpatient if her elevated LFTs persist.  The patient's  cardiac and congestive heart failure medications were gently adjusted.  She  was placed on Lanoxin, ACE inhibitor, Captopril, spironolactone, and low  doses of Lopressor and Lasix.  Her blood pressure was low, but steady.   On Feb 28, 2004, Dr. Dietrich Pates noted that with sizeable intra-apical MI that  warfarin therapy would be desirable.  However, he felt that the risk/benefit  ratio of warfarin and Plavix was uncertain.  Therefore, she was discontinued  on aspirin and Plavix.   On Feb 29, 2004, she was ready for discharge to home.  Dr. Myrtis Ser saw the  patient and noted that she would prefer to follow up with Dr. Andee Lineman in  [place], [place].  We have asked the patient to follow up with Dr.  Andee Lineman in one to two weeks and the office should contact her with an  appointment.  She will need a CBC and CMET at the time of her followup.  This will be to follow up on her renal function given her new medications,  her anemia from the retroperitoneal bleed, as well as her elevated LFTs  during her admission.   LABORATORIES:  On Feb 28, 2004, BNP 736.9.  Sodium 135, potassium 4, chloride  101, CO2 27, glucose 117, BUN 6,  creatinine 0.8, calcium 8.1, total  protein 5.8, albumin 2.3, AST 65, ALT 86, total bilirubin 1.5, alkaline  phosphatase 357.  White count 8500, hemoglobin 11.6, hematocrit 34.2,  platelet count 438,000.  The magnesium on Feb 24, 2004, was 2.8.  [facility] CK-MB  601.8.  Cultures as noted above.  The chest x-ray on Feb 24, 2004, showed no  significant change from Feb 23, 2004.  Improved interstitial edema since  study on Feb 22, 2004.  Chest x-ray on Feb 25, 2004, with no significant  interval change in position of PICC line tip which again lies to the left of  the midline and no pneumothorax seen.  Chest x-ray on Feb 25, 2004, showed  PICC line tip located in the region of the left subclavian vein/left  brachiocephalic vein junction, and stable cardiomegaly.  Abdominal  ultrasound on Feb 24, 2004, showed no significant abnormality.  On complete  abdominal ultrasound, the distal aortic and iliac vessels were not well  seen.  Chest x-ray from Feb 23, 2004, showed resolved interstitial edema,  stable cardiomegaly, and mild chronic interstitial lung disease.  Chest x-  ray from Feb 22, 2004, showed stable cardiomegaly and mild residual  interstitial edema.  Chest x-ray from Feb 21, 2004, showed improvement in  mild edema pattern.  Chest x-ray from Feb 20, 2004, showed mild cardiomegaly  with vascular congestion unchanged, bibasilar atelectasis improving, and  left perihilar density.   DISCHARGE MEDICATIONS:  1. Aspirin 325 mg daily.  2. Plavix 75 mg daily.  3. Protonix 40 mg daily.  4. Lanoxin 0.25 mg daily.  5. Captopril 6.25 mg three times a day.  6. Spironolactone 25 mg daily.  7. Lopressor elixir 6.25 mg b.i.d.  8. Lasix 10 mg daily.  9. Ensure t.i.d.  10.      Nitroglycerin p.r.n. chest pain.   PAIN MANAGEMENT:  1. Tylenol as needed.  2. Nitroglycerin as needed for chest pain.   SPECIAL INSTRUCTIONS:  1. She is to call our office in [place], [place], or [place] for     recurrent chest  pain.  2. She should weight herself daily.  3. Call if she gains more than 3 pounds in one day.   ACTIVITY:  No driving, lifting, exertion, sex, or work for two weeks.   DIET:  Low fat, low sodium.  She should consume less than 2 g of sodium per  day.   FOLLOWUP:  Follow up will be with Dr. Andee Lineman of [facility] in the  next one to two weeks.  The office will contact her with an appointment.  She will get a CMET and CBC drawn at her followup appointment.      Tereso Newcomer, P.A.                        Learta Codding, M.D. [facility]    SW/MEDQ  D:  02/29/2004  T:  02/29/2004  Job:  [id number]   cc:   Learta Codding, M.D. [facility]   Dhruv Vyas  [address]  [place]  [place] [address]  Fax: [phone]

## 2011-03-05 NOTE — Op Note (Signed)
[name], [name]                         ACCOUNT NO.:  [id number]   MEDICAL RECORD NO.:  [id number]                   PATIENT TYPE:  INP   LOCATION:  4732                                 FACILITY:  [facility]   PHYSICIAN:  Duke Salvia, M.D.               DATE OF BIRTH:  01-28-1941   DATE OF PROCEDURE:  06/24/2004  DATE OF DISCHARGE:                                 OPERATIVE REPORT   PREOPERATIVE DIAGNOSIS:  Ischemic cardiomyopathy status post myocardial  infarction, depressed left ventricular function, ejection fraction about  20%.   POSTOPERATIVE DIAGNOSIS:  Ischemic cardiomyopathy status post myocardial  infarction, depressed left ventricular function, ejection fraction about  20%.   PROCEDURE:  ICD implantation with intraoperative defibrillation threshold  testing and contrast venography.   Following the obtainment of informed consent, the patient was brought to the  electrophysiology laboratory and placed on the fluoroscopic table in the  supine position.  After routine prepping and draping of the left upper  chest, lidocaine was infiltrated in the prepectoral subclavicular region.  An incision was made and carried down to the layer of the prepectoral fascia  using the electrocautery and sharp dissection.  A pocket was formed  similarly, hemostasis was obtained.   Thereafter, attention was turned to gain access to the extrathoracic left  subclavian vein and a contrast venogram was undertaken prior just because of  the patient's unusual shoulder anatomy.  Venous access was accomplished  without difficulty.  A 7 French tearaway introducer sheath was placed and  through this was passed a Medtronic __________ cm  active fixation dual coil  defibrillator lead, serial #[id number].  Under fluoroscopic guidance it was  manipulated in the right ventricular apex which was mapped extensively but  failed to identify a place where we could pace.  We ended up having to take  the  lead and move it somewhat up the septum, and it was indeed about a third  of the way up to the septum before we could find an R wave and a pacing  threshold that was adequate.  In this location the bipolar R wave was 11.6  mV with a pacing impedance of 1060 ohms and a threshold of 1.5 V at 0.5  msec, current threshold was 1.8 mA and there was no diaphragmatic pacing at  10 V.   The lead was then attached to a Medtronic Maximo T228550 CX ICD, serial  #[id number].  Through the device the bipolar R wave was 10 mV with a pacing  impedance of 800 ohms and a threshold of 1 V and 0.6 msec, high voltage  impedance was 49 ohms, proximal coil impedance was 59 ohms.   With these acceptable parameters recorded, defibrillation threshold testing  was undertaken.  Ventricular fibrillation was induced via the T-wave shock.  After a total duration of 6 seconds a 25 dual shock was delivered through a  measured resistance of  45 ohms, terminating what had evolved in ventricular  tachycardia and restoring sinus rhythm.   After a wait of 5-6 minutes, ventricular fibrillation was reintroduced via  the T-wave shock.  After a total duration of 6 seconds, a 25 dual shock was  delivered through a measured resistance of 45 ohms, terminating ventricular  fibrillation and restoring sinus rhythm.  With these acceptable parameters  recorded, the system was implanted.  The pocket was copiously irrigated with  antibiotic containing saline solution.  Hemostasis was ensured.  The lead  and the pulse generator were placed in the pocket and secured to the  prepectoral fascia.  It should be noted that the incision turned out to be  somewhat more lateral than I wanted and the medial aspect of the pocket was  enlarged to allow for  more medial location of the defibrillator relative to the skin incision.  The wound was washed, dried with benzoin and Steri-Strip dressing was  applied.  Needle count, sponge counts and instrument  counts were correct at  the end of the procedure, according to the staff.  The patient tolerated the  procedure without apparent complication.                                               Duke Salvia, M.D.    SCK/MEDQ  D:  06/24/2004  T:  06/24/2004  Job:  [id number]   cc:   Electrophysiology Laboratory   [facility]   [facility]  [address]  [address]  [place], [place] [address]   [facility]  [address]  [place], [place] [address]

## 2011-03-05 NOTE — Assessment & Plan Note (Signed)
[facility]                              CARDIOLOGY OFFICE NOTE   NAME:[name], [name]                    MRN:          [id number]  DATE:07/04/2006                            DOB:          10/05/1941    IDENTIFICATION:  [name] is a 70 year old woman who was previously  followed by Lewayne Bunting in clinic.  She is status post anterior MI in 2005,  underwent PTCA of the LAD.  No stent was placed, as the lesion was right at  the ostium.  There was also a PTCA done of the mid-LAD.  Post-procedure, she  recorded an intra-balloon pump.   She has a residual 50% stenosis.   The patient also had significant LV dysfunction.  After she saw Dr. Andee Lineman,  she had an echocardiogram done that showed an LVF of 25 to 30% with apical  mid-distal inferior, mid-distal anterior akinesis.  No thrombus was noted.  There was mild to moderate MR.   In addition, the patient has an ICD placed, and this is followed by Dr.  Graciela Husbands.   In the interval, she is tired.  She gives out when she does things.  She  notes some back pain and abdominal pain intermittently.  It is hard for me  to pin down when she gets this.  She says actually her family reports that  her angina with the MI was back pain and abdominal  pain.   She notes occasional dizziness a few times per week, no syncope, no PND, no  orthopnea.  Appetite is okay.  Weight is down a little.   CURRENT MEDICATIONS:  1. Toprol XL 12.5 q. day.  2. Plavix 75 q. day.  3. Aldactone 25 q. day, patient out.  4. Lovastatin 20 q. day.  5. Cozaar 50 q. day.  6. Prilosec 20 q. day.   PHYSICAL EXAMINATION:  GENERAL:  Patient is in no distress.  VITAL SIGNS:  Blood pressure 110/71, pulse 72, weight 114.  LUNGS:  Clear.  NECK:  JVD approximately 9.  CARDIAC:  Regular rate and rhythm, S1, S2.  No definite S3, grade 1/6  systolic murmur.  ABDOMEN:  No hepatomegaly.  EXTREMITIES:  No edema.   A 12-lead EKG shows sinus rhythm  at a rate of 72 beats per minute.  T-wave  inversion V1 through V5 unchanged, relatively poor with progression in the  medial leads.   IMPRESSION:  1. Coronary artery disease status post MI residual, significant LV      dysfunction.  Again, the patient only had a angioplasty done because of      the ostial location of the lesion.  It is concerning, and there is      translation issues, but I concerned that her back pain and abdominal      pain are anginal equivalent.  She is fatigued.  I would recommend an      adenosine Myoview to rule out ischemia in the LAD distribution,      continue on current regimen.  2. Congestive heart failure volume status looks good.  Would continue  again written for refills on the Aldactone as well.  3. Dyslipidemia.  She has not eaten today.  We will go ahead and followup      with LFTs.   I would like to see the patient back in 6 months, and I can do this in a  refill clinic.  Again, will be in touch with her regarding the test results.  If her symptoms change or worsen, of course she should come in sooner.                                Pricilla Riffle, MD, [facility]    PVR/MedQ  DD:  07/04/2006  DT:  07/05/2006  Job #:  [id number]   cc:   Doreen Beam

## 2011-03-05 NOTE — H&P (Signed)
NAME:  [name], Reisner NO.:  [id number]   MEDICAL RECORD NO.:  [id number]                   PATIENT TYPE:   LOCATION:                                       FACILITY:   PHYSICIAN:  Learta Codding, M.D. [facility]             DATE OF BIRTH:  November 27, 1940   DATE OF ADMISSION:  02/18/2004  DATE OF DISCHARGE:                                HISTORY & PHYSICAL   REASON FOR ADMISSION:  Acute anterolateral wall myocardial infarction.   HISTORY OF PRESENT ILLNESS:  [name] is a 69 year old Hindu female  admitted with acute anterolateral wall myocardial infarction.  The patient  started to experience substernal chest pain late last evening at around 7  o'clock.  Progressively her pain became worse until she ultimately presented  to the emergency room at [facility] earlier this morning.  EKG  demonstrated marked ST elevation in anterior precordial leads as well as the  inferior leads.  The patient was hemodynamically stable but continued to  complain of significant chest pain. She was started on aspirin and heparin  and Integrilin and was transferred to [facility] for acute coronary  intervention.   ALLERGIES:  No known drug allergies.   MEDICATIONS:  None.   FAMILY HISTORY AND SOCIAL HISTORY:  The patient lives in [place] with her  family.  She does not smoke,  She denies alcohol use.   REVIEW OF SYSTEMS:  As per HPI.  The patient complained of nausea and  vomiting while in the catheterization laboratory.  She also had some  shortness of breath.  No fever or chills.  No palpitations or syncope.   PHYSICAL EXAMINATION:  VITAL SIGNS:  Blood pressure 137/83 mmHg, heart rate  75 beats per minute.  GENERAL:  [place] female in mild distress.  NECK:  Normal carotid upstroke, no bruits.  LUNGS:  Clear.  Breath sounds bilaterally.  HEART:  Regular rate and rhythm, normal sinus rhythm.  ABDOMEN:  Soft.  EXTREMITIES:  No cyanosis, clubbing, or edema.   LABORATORY AND X-RAY DATA:  A 12-lead electrocardiogram was as outlined  above.   Glucose 148, BUN 6, creatinine 1.1, calcium 8.6, sodium 137, potassium 4.1,  chloride 108.  CO2 is 24.  White count is 8.7, hemoglobin 13.1, platelet  count 336.  INR 1.0.  Initial CK 73.   IMPRESSION:  1. Acute anterolateral wall myocardial infarction, electrocardiogram     suggestion of proximal LAD lesion.  The patient will undergo acute     coronary intervention.  The patient was informed about the procedure     through Dr. Ellie Lunch who speaks Hindu.  The family is in agreement to     proceed with cardiac catheterization and possible     intervention.  We also explained the need for long-term progress, and a     drug-eluting stent would be placed, and the family understands this and  states there will be no difficulty assuring compliance with Plavix.  2. Disposition:  Acute intervention.  The case has been discussed with Dr.     Riley Kill.                                                Learta Codding, M.D. [facility]    GED/MEDQ  D:  02/18/2004  T:  02/18/2004  Job:  [id number]   cc:   Doreen Beam  [address]  [place]  [place] [address]  Fax: [phone]

## 2011-03-05 NOTE — Discharge Summary (Signed)
NAMEJASEY, CORTEZ                         ACCOUNT NO.:  [id number]   MEDICAL RECORD NO.:  [id number]                   PATIENT TYPE:  INP   LOCATION:  4732                                 FACILITY:  [facility]   PHYSICIAN:  Duke Salvia, M.D.               DATE OF BIRTH:  1941/07/14   DATE OF ADMISSION:  06/24/2004  DATE OF DISCHARGE:  06/25/2004                                 DISCHARGE SUMMARY   DISCHARGE DIAGNOSES:  1.  Admit for elective implantation of Medtronic MAXIMO T228550 Cx ICD serial      #[id number] H.  2.  History of myocardial infarction, ischemic cardiomyopathy ejection      fraction 20%.  3.  Status post percutaneous transluminal coronary angioplasty of the left      anterior descending artery in the setting of cardiogenic shock, required      intra aortic balloon pump.  4.  Class II congestive heart failure symptoms.   PROCEDURE:  Implantation of Medtronic MAXIMO ICD.   DISCHARGE DISPOSITION:  Ms. Retana ready for discharge post-procedure day #1.  She has been maintaining a sinus rhythm overnight 99% oxygen saturation on  room air, blood pressure is somewhat moderately reduced with systolic blood  pressure 87/61, however she is asymptomatic with this.  Her incision is  healing nicely without swelling, ecchymoses or drainage.  She has a regular  heart rate without murmur or rub.  Her chest x-ray shows lead appropriately  placed, intact without pneumothorax, pacer interrogation shows all values  within normal limits, no changes or observations.   MEDICATIONS:  The patient goes home on the following medications:  1.  Metoprolol XL 25 mg 1/2 tab daily.  2.  Captopril 25 mg 1/4 tab three times daily.  3.  Protonix 40 mg daily.  4.  Spironolactone 25 mg daily.  5.  Digitek 0.25 mg daily.  6.  Lovastatin 20 mg daily at bedtime.  7.  Enteric-coated aspirin 81 mg daily.  8.  Plavix 75 mg daily.  She is to hold off taking that until June 27, 2004, this  Saturday.  9.  For pain, Darvocet-N 100 1-2 tabs every 3-4 hours as needed for pain.   Activities have been discussed with the patient and she has been given an  activity sheet with a graphic instruction.  Diet -- low sodium, low  cholesterol diet.  She is asked to sponge bathe until Wednesday July 01, 2004, to keep her incision dry until then.  Followup visit with [facility] Wednesday July 15, 2004 at 9:30 in the morning  and she will see Dr. Graciela Husbands September 30, 2004 at 9:20 in the morning.   BRIEF HISTORY:  Ms. Tavano is a 70 year old female.  She suffered an anterior  wall myocardial infarction about four months ago complicated by cardiogenic  shock and she required intra-aortic balloon pump  support.  At that time, she  underwent percutaneous coronary intervention of the LAD.  She had a  Cardiolite study which demonstrated large scar without ischemia, ejection  fraction of 20%.  She denies palpitations or syncope.  She does have  orthostatic intolerance accompanied by diaphoresis.  She has had no other  syncope.  She does not have edema, nocturnal dyspnea or orthopnea and has  had no history of palpitations.  On examination, the patient has class II  congestive heart failure symptoms, it is Dr. Odessa Fleming impression that Ms.  Guthridge would benefit from implant cardioverter defibrillator and the risks  and benefits have been reviewed extensively with her and her family, who  have served as translators.  The potential risks and benefits have been  described.  The patient wishes to proceed.  This will be done on an elective  basis June 24, 2004.   HOSPITAL COURSE:  The patient admitted to [facility] June 24, 2004, underwent successful implantation of Medtronic device, had no  complications post procedurally, goes home with the medications and followup  as dictated.      Maple Mirza, P.A.                    Duke Salvia, M.D.     GM/MEDQ  D:  06/25/2004  T:  06/25/2004  Job:  [id number]   cc:   Doreen Beam  [address]  [place]  [place] [address]  Fax: [phone]   Learta Codding, M.D. [facility]   Duke Salvia, M.D.

## 2011-03-10 ENCOUNTER — Encounter: Payer: Medicare Other | Admitting: *Deleted

## 2011-03-11 ENCOUNTER — Ambulatory Visit (INDEPENDENT_AMBULATORY_CARE_PROVIDER_SITE_OTHER): Payer: Medicare Other | Admitting: *Deleted

## 2011-03-11 ENCOUNTER — Encounter: Payer: Medicare Other | Admitting: *Deleted

## 2011-03-11 DIAGNOSIS — I428 Other cardiomyopathies: Secondary | ICD-10-CM

## 2011-03-11 DIAGNOSIS — I471 Supraventricular tachycardia: Secondary | ICD-10-CM

## 2011-03-11 NOTE — Progress Notes (Signed)
icd check in clinic  

## 2011-06-14 ENCOUNTER — Encounter: Payer: Medicare Other | Admitting: *Deleted

## 2011-06-14 ENCOUNTER — Ambulatory Visit (INDEPENDENT_AMBULATORY_CARE_PROVIDER_SITE_OTHER): Payer: Medicare Other | Admitting: *Deleted

## 2011-06-14 DIAGNOSIS — I471 Supraventricular tachycardia: Secondary | ICD-10-CM

## 2011-07-09 LAB — HEMOGLOBIN AND HEMATOCRIT, BLOOD
HCT: 37.2
Hemoglobin: 12.7

## 2011-07-09 LAB — BASIC METABOLIC PANEL
Chloride: 104
Creatinine, Ser: 0.77
GFR calc Af Amer: >60
GFR calc non Af Amer: >60

## 2011-09-03 ENCOUNTER — Encounter: Payer: Self-pay | Admitting: *Deleted

## 2011-09-06 ENCOUNTER — Encounter: Payer: Self-pay | Admitting: Internal Medicine

## 2011-09-06 ENCOUNTER — Ambulatory Visit (INDEPENDENT_AMBULATORY_CARE_PROVIDER_SITE_OTHER): Payer: Medicare Other | Admitting: Internal Medicine

## 2011-09-06 DIAGNOSIS — I2589 Other forms of chronic ischemic heart disease: Secondary | ICD-10-CM

## 2011-09-06 DIAGNOSIS — I471 Supraventricular tachycardia, unspecified: Secondary | ICD-10-CM

## 2011-09-06 DIAGNOSIS — Z95810 Presence of automatic (implantable) cardiac defibrillator: Secondary | ICD-10-CM | POA: Insufficient documentation

## 2011-09-06 DIAGNOSIS — M25519 Pain in unspecified shoulder: Secondary | ICD-10-CM

## 2011-09-06 DIAGNOSIS — I5022 Chronic systolic (congestive) heart failure: Secondary | ICD-10-CM | POA: Insufficient documentation

## 2011-09-06 DIAGNOSIS — I428 Other cardiomyopathies: Secondary | ICD-10-CM

## 2011-09-06 DIAGNOSIS — M25511 Pain in right shoulder: Secondary | ICD-10-CM | POA: Insufficient documentation

## 2011-09-06 DIAGNOSIS — T82198A Other mechanical complication of other cardiac electronic device, initial encounter: Secondary | ICD-10-CM

## 2011-09-06 DIAGNOSIS — M25512 Pain in left shoulder: Secondary | ICD-10-CM

## 2011-09-06 HISTORY — DX: Other mechanical complication of other cardiac electronic device, initial encounter: T82.198A

## 2011-09-06 NOTE — Assessment & Plan Note (Signed)
The patient's device was interrogated.  The information was reviewed. No changes were made in the programming.    

## 2011-09-06 NOTE — Patient Instructions (Addendum)
Your physician recommends that you have lab work today: Sed Rate  Your physician recommends that you schedule a follow-up appointment in: 3 months with Kristin/ Gunnar Fusi for a device check.  Your physician wants you to follow-up in: 1 year with Dr. Graciela Husbands. You will receive a reminder letter in the mail two months in advance. If you don't receive a letter, please call our office to schedule the follow-up appointment.  Your physician recommends that you continue on your current medications as directed. Please refer to the Current Medication list given to you today.

## 2011-09-06 NOTE — Assessment & Plan Note (Signed)
Demonstrated tones and gave the family a magnet and instructed them to call 911 in the event that are either shocks or alerts

## 2011-09-06 NOTE — Assessment & Plan Note (Signed)
I do not think that her symptoms are ischemic. They occur at rest. Continue current medications

## 2011-09-06 NOTE — Assessment & Plan Note (Signed)
Will continue current medications. We should think about adding Aldactone

## 2011-09-06 NOTE — Progress Notes (Signed)
  HPI  [name] is a 70 y.o. 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 She is status post ICD implantation. She is a 6949 in place  She has 2 major complaints. The first is fatigue which has been gradually progressive. She denies chest exertional chest pain. There is no peripheral edema.  She also has significant aching in her shoulders neck and upper back. This is unrelated to exertion. Past Medical History  Diagnosis Date  . PSVT (paroxysmal supraventricular tachycardia)   . Ischemic cardiomyopathy     Anterior MI 2005 with primary PCI  . Automatic implantable cardiac defibrillator in situ   . Chronic systolic heart failure   . 1610 lead 09/06/2011    Past Surgical History  Procedure Date  . Cardiac defibrillator placement     Medtronic    Current Outpatient Prescriptions  Medication Sig Dispense Refill  . carvedilol (COREG) 3.125 MG tablet Take 1 tablet (3.125 mg total) by mouth 2 (two) times daily.  60 tablet  11  . clopidogrel (PLAVIX) 75 MG tablet Take 1 tablet (75 mg total) by mouth daily.  30 tablet  11  . diclofenac sodium (VOLTAREN) 1 % GEL Apply 1 application topically.        . gabapentin (NEURONTIN) 100 MG capsule Take 100 mg by mouth at bedtime.        [name] Kitchen lisinopril (PRINIVIL,ZESTRIL) 2.5 MG tablet Take 2.5 mg by mouth daily.        [name] Kitchen omeprazole (PRILOSEC) 20 MG capsule Take 20 mg by mouth 2 (two) times daily.        . simvastatin (ZOCOR) 20 MG tablet Take 20 mg by mouth at bedtime.          No Known Allergies  Review of Systems negative except from HPI and PMH  Physical Exam Well developed and well nourished in no acute distress HENT normal E scleral and icterus clear Neck Supple JVP flat; carotids brisk and full Clear to ausculation Regular rate and rhythm, no murmurs gallops or rub Soft with active bowel sounds No clubbing cyanosis and edema Alert and oriented, grossly normal motor and sensory function Skin  Warm and Dry  ECG  Assessment and  Plan

## 2011-09-06 NOTE — Assessment & Plan Note (Signed)
The concern here is polymyalgia rheumatica. We will check a sedimentation rate.

## 2011-09-10 ENCOUNTER — Encounter: Payer: Self-pay | Admitting: *Deleted

## 2011-12-08 ENCOUNTER — Ambulatory Visit (INDEPENDENT_AMBULATORY_CARE_PROVIDER_SITE_OTHER): Payer: Medicare Other | Admitting: *Deleted

## 2011-12-08 ENCOUNTER — Encounter: Payer: Self-pay | Admitting: Internal Medicine

## 2011-12-08 DIAGNOSIS — I471 Supraventricular tachycardia: Secondary | ICD-10-CM

## 2011-12-08 DIAGNOSIS — I5022 Chronic systolic (congestive) heart failure: Secondary | ICD-10-CM

## 2011-12-08 DIAGNOSIS — I2589 Other forms of chronic ischemic heart disease: Secondary | ICD-10-CM

## 2011-12-08 LAB — ICD DEVICE OBSERVATION
BATTERY VOLTAGE: 2.93 V
PACEART VT: 1
TZAT-0004FASTVT: 8
TZAT-0004SLOWVT: 8
TZAT-0004SLOWVT: 8
TZAT-0005FASTVT: 88 pct
TZAT-0005SLOWVT: 84 pct
TZAT-0005SLOWVT: 91 pct
TZAT-0011SLOWVT: 10 ms
TZAT-0011SLOWVT: 10 ms
TZAT-0012FASTVT: 200 ms
TZAT-0012SLOWVT: 200 ms
TZAT-0012SLOWVT: 200 ms
TZAT-0013FASTVT: 1
TZON-0003FASTVT: 240 ms
TZON-0003SLOWVT: 370 ms
TZON-0008SLOWVT: 0 ms
TZON-0010AFLUTTER: 30 ms
TZON-0010VSLOWVT: 30 ms
TZON-0011AFLUTTER: 70
TZST-0001FASTVT: 2
TZST-0001FASTVT: 5
TZST-0001SLOWVT: 4
TZST-0003FASTVT: 35 J
TZST-0003FASTVT: 35 J
TZST-0003SLOWVT: 25 J
TZST-0003SLOWVT: 35 J
TZST-0003SLOWVT: 35 J
VENTRICULAR PACING ICD: 0 pct
VF: 0

## 2011-12-08 NOTE — Progress Notes (Signed)
ICD check 

## 2011-12-31 ENCOUNTER — Encounter: Payer: Self-pay | Admitting: Internal Medicine

## 2012-02-17 ENCOUNTER — Other Ambulatory Visit: Payer: Self-pay | Admitting: Internal Medicine

## 2012-03-08 ENCOUNTER — Ambulatory Visit (INDEPENDENT_AMBULATORY_CARE_PROVIDER_SITE_OTHER): Payer: Medicare Other | Admitting: *Deleted

## 2012-03-08 ENCOUNTER — Encounter: Payer: Self-pay | Admitting: Internal Medicine

## 2012-03-08 DIAGNOSIS — I2589 Other forms of chronic ischemic heart disease: Secondary | ICD-10-CM

## 2012-03-08 DIAGNOSIS — I471 Supraventricular tachycardia: Secondary | ICD-10-CM

## 2012-03-08 DIAGNOSIS — I5022 Chronic systolic (congestive) heart failure: Secondary | ICD-10-CM

## 2012-03-08 LAB — ICD DEVICE OBSERVATION
BATTERY VOLTAGE: 2.88 V
BRDY-0002RV: 40 {beats}/min
CHARGE TIME: 8.36 s
TZAT-0001FASTVT: 1
TZAT-0004FASTVT: 8
TZAT-0004SLOWVT: 8
TZAT-0004SLOWVT: 8
TZAT-0005FASTVT: 88 pct
TZAT-0011SLOWVT: 10 ms
TZAT-0013FASTVT: 1
TZAT-0019SLOWVT: 8 V
TZAT-0019SLOWVT: 8 V
TZAT-0020FASTVT: 1.6 ms
TZAT-0020SLOWVT: 1.6 ms
TZAT-0020SLOWVT: 1.6 ms
TZON-0008SLOWVT: 0 ms
TZON-0010AFLUTTER: 30 ms
TZON-0010FASTVT: 30 ms
TZON-0010SLOWVT: 30 ms
TZON-0010VSLOWVT: 30 ms
TZST-0001FASTVT: 2
TZST-0001FASTVT: 4
TZST-0001FASTVT: 5
TZST-0001SLOWVT: 3
TZST-0001SLOWVT: 5
TZST-0003FASTVT: 35 J
TZST-0003FASTVT: 35 J
TZST-0003FASTVT: 35 J
TZST-0003FASTVT: 35 J
TZST-0003SLOWVT: 25 J
TZST-0003SLOWVT: 35 J
TZST-0003SLOWVT: 35 J
VENTRICULAR PACING ICD: 0 pct
VF: 0

## 2012-03-08 NOTE — Progress Notes (Signed)
ICD check 

## 2012-06-08 ENCOUNTER — Encounter: Payer: Self-pay | Admitting: Internal Medicine

## 2012-06-08 ENCOUNTER — Ambulatory Visit (INDEPENDENT_AMBULATORY_CARE_PROVIDER_SITE_OTHER): Payer: Medicare Other | Admitting: *Deleted

## 2012-06-08 DIAGNOSIS — I471 Supraventricular tachycardia, unspecified: Secondary | ICD-10-CM

## 2012-06-08 DIAGNOSIS — I5022 Chronic systolic (congestive) heart failure: Secondary | ICD-10-CM

## 2012-06-08 LAB — ICD DEVICE OBSERVATION
BATTERY VOLTAGE: 2.83 V
BRDY-0002RV: 40 {beats}/min
CHARGE TIME: 8.36 s
TZAT-0001FASTVT: 1
TZAT-0004FASTVT: 8
TZAT-0012SLOWVT: 200 ms
TZAT-0012SLOWVT: 200 ms
TZAT-0013FASTVT: 1
TZAT-0018FASTVT: NEGATIVE
TZAT-0018SLOWVT: NEGATIVE
TZAT-0018SLOWVT: NEGATIVE
TZAT-0019FASTVT: 8 V
TZAT-0019SLOWVT: 8 V
TZAT-0019SLOWVT: 8 V
TZAT-0020FASTVT: 1.6 ms
TZAT-0020SLOWVT: 1.6 ms
TZAT-0020SLOWVT: 1.6 ms
TZON-0003SLOWVT: 370 ms
TZON-0008SLOWVT: 0 ms
TZON-0010FASTVT: 30 ms
TZON-0010SLOWVT: 30 ms
TZON-0010VSLOWVT: 30 ms
TZST-0001FASTVT: 2
TZST-0001FASTVT: 4
TZST-0001SLOWVT: 5
TZST-0003FASTVT: 35 J
TZST-0003FASTVT: 35 J
TZST-0003SLOWVT: 25 J
TZST-0003SLOWVT: 35 J
TZST-0003SLOWVT: 35 J
VENTRICULAR PACING ICD: 0 pct

## 2012-06-08 NOTE — Progress Notes (Signed)
ICD check 

## 2012-09-08 ENCOUNTER — Ambulatory Visit (INDEPENDENT_AMBULATORY_CARE_PROVIDER_SITE_OTHER): Payer: Medicare Other | Admitting: Internal Medicine

## 2012-09-08 ENCOUNTER — Encounter: Payer: Self-pay | Admitting: Internal Medicine

## 2012-09-08 VITALS — BP 153/76 | HR 68 | Resp 18 | Ht 60.0 in | Wt 118.0 lb

## 2012-09-08 DIAGNOSIS — I471 Supraventricular tachycardia, unspecified: Secondary | ICD-10-CM

## 2012-09-08 DIAGNOSIS — Z95810 Presence of automatic (implantable) cardiac defibrillator: Secondary | ICD-10-CM

## 2012-09-08 DIAGNOSIS — I5022 Chronic systolic (congestive) heart failure: Secondary | ICD-10-CM

## 2012-09-08 DIAGNOSIS — T82198A Other mechanical complication of other cardiac electronic device, initial encounter: Secondary | ICD-10-CM

## 2012-09-08 DIAGNOSIS — R079 Chest pain, unspecified: Secondary | ICD-10-CM

## 2012-09-08 LAB — ICD DEVICE OBSERVATION
BRDY-0002RV: 40 {beats}/min
DEV-0020ICD: NEGATIVE
MODE SWITCH EPISODES: 0
RV LEAD AMPLITUDE: 14.1 mv
RV LEAD IMPEDENCE ICD: 512 Ohm
TZAT-0001FASTVT: 1
TZAT-0004FASTVT: 8
TZAT-0005FASTVT: 88 pct
TZAT-0011SLOWVT: 10 ms
TZAT-0011SLOWVT: 10 ms
TZAT-0012FASTVT: 200 ms
TZAT-0012SLOWVT: 200 ms
TZAT-0012SLOWVT: 200 ms
TZAT-0013FASTVT: 1
TZAT-0013SLOWVT: 2
TZAT-0013SLOWVT: 2
TZAT-0018FASTVT: NEGATIVE
TZAT-0018SLOWVT: NEGATIVE
TZAT-0019SLOWVT: 8 V
TZAT-0019SLOWVT: 8 V
TZON-0003FASTVT: 240 ms
TZON-0003SLOWVT: 370 ms
TZON-0004SLOWVT: 20
TZON-0005SLOWVT: 12
TZON-0008SLOWVT: 0 ms
TZON-0010AFLUTTER: 30 ms
TZON-0010FASTVT: 30 ms
TZON-0010SLOWVT: 30 ms
TZON-0011AFLUTTER: 70
TZST-0001FASTVT: 2
TZST-0001FASTVT: 5
TZST-0001SLOWVT: 4
TZST-0003FASTVT: 35 J
TZST-0003FASTVT: 35 J
TZST-0003SLOWVT: 25 J
TZST-0003SLOWVT: 35 J
TZST-0003SLOWVT: 35 J
VENTRICULAR PACING ICD: 0.09 pct

## 2012-09-08 NOTE — Assessment & Plan Note (Signed)
Stable instructions given the past

## 2012-09-08 NOTE — Patient Instructions (Signed)
Your physician has requested that you have a lexiscan myoview. For further information please visit [email or web address]. Please follow instruction sheet, as given.   

## 2012-09-08 NOTE — Progress Notes (Signed)
  HPI  [name] is a 71 y.o. 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 She is status post ICD implantation. She is a 6949 in place   She is here with a translator today. She is complaining of discomfort in her chest associated with exertion and accompanied by shortness of breath. It is relieved by rest of 5 or so minutes. It is not occur at rest. She does not have problems with peripheral edema. This discomfort radiates into her arms bilaterally. She feels at that time it is difficult to lift a pilloiw  Abjo=goodbye  Kamcho==hello how are you  Past Medical History  Diagnosis Date  . PSVT (paroxysmal supraventricular tachycardia)   . Ischemic cardiomyopathy     Anterior MI 2005 with primary PCI  . Automatic implantable cardiac defibrillator in situ   . Chronic systolic heart failure   . 1610 lead 09/06/2011    Past Surgical History  Procedure Date  . Cardiac defibrillator placement     Medtronic    Current Outpatient Prescriptions  Medication Sig Dispense Refill  . carvedilol (COREG) 3.125 MG tablet TAKE ONE TABLET TWICE DAILY  60 tablet  5  . clopidogrel (PLAVIX) 75 MG tablet TAKE ONE (1) TABLET EACH DAY          USED FOR PLAVIX  30 tablet  5  . diclofenac sodium (VOLTAREN) 1 % GEL Apply 1 application topically.        . gabapentin (NEURONTIN) 100 MG capsule Take 100 mg by mouth at bedtime.        [name] Kitchen lisinopril (PRINIVIL,ZESTRIL) 2.5 MG tablet Take 2.5 mg by mouth daily.        [name] Kitchen omeprazole (PRILOSEC) 20 MG capsule Take 20 mg by mouth 2 (two) times daily.        . simvastatin (ZOCOR) 20 MG tablet Take 20 mg by mouth at bedtime.          No Known Allergies  Review of Systems negative except from HPI and PMH  Physical Exam BP 153/76  Pulse 68  Resp 18  Ht 5' (1.524 m)  Wt 118 lb (53.524 kg)  BMI 23.05 kg/m2  SpO2 96%  Well developed and well nourished in no acute distress HENT normal E scleral and icterus clear Neck  Supple JVP flat;  Clear to ausculation Regular rate and rhythm, no murmurs gallops or rub Soft with active bowel sounds No clubbing cyanosis and edema Alert and oriented, grossly normal motor and sensory function Skin Warm and Dry  ECG n sinus rhythm at 52 Intervals 09/26/43 no evidence of ventricular preexcitation T-wave inversions laterally no change from a year ago but we don't need to do a sedimentation rate we didn't last  Assessment and  Plan

## 2012-09-08 NOTE — Assessment & Plan Note (Signed)
Patient with ischemic heart disease now wi exertional associated chest pains ECG abnormalities. We'll plan to undertake Myoview scanning  cholesterol is being followed by her primary care physician

## 2012-09-08 NOTE — Assessment & Plan Note (Signed)
Continues with shoulder pain. We'll exclude ischemia. Sedimentation rate was nearly normal a year ago

## 2012-09-08 NOTE — Assessment & Plan Note (Signed)
stable on current meds   stable on current meds

## 2012-09-08 NOTE — Assessment & Plan Note (Signed)
The patient's device was interrogated.  The information was reviewed. No changes were made in the programming.    

## 2012-09-19 ENCOUNTER — Other Ambulatory Visit: Payer: Self-pay | Admitting: *Deleted

## 2012-09-19 ENCOUNTER — Ambulatory Visit (HOSPITAL_COMMUNITY)
Admission: RE | Admit: 2012-09-19 | Discharge: 2012-09-19 | Payer: Medicare Other | Source: Ambulatory Visit | Attending: Internal Medicine | Admitting: Internal Medicine

## 2012-09-19 ENCOUNTER — Other Ambulatory Visit: Payer: Self-pay | Admitting: Internal Medicine

## 2012-09-19 DIAGNOSIS — I5022 Chronic systolic (congestive) heart failure: Secondary | ICD-10-CM

## 2012-09-19 DIAGNOSIS — I471 Supraventricular tachycardia: Secondary | ICD-10-CM

## 2012-09-19 DIAGNOSIS — Z95810 Presence of automatic (implantable) cardiac defibrillator: Secondary | ICD-10-CM

## 2012-09-19 DIAGNOSIS — T82198A Other mechanical complication of other cardiac electronic device, initial encounter: Secondary | ICD-10-CM

## 2012-09-19 DIAGNOSIS — R079 Chest pain, unspecified: Secondary | ICD-10-CM

## 2012-09-19 MED ORDER — CLOPIDOGREL BISULFATE 75 MG PO TABS: 75.0000 mg | ORAL_TABLET | Freq: Every day | ORAL | Status: DC

## 2012-09-28 ENCOUNTER — Other Ambulatory Visit: Payer: Self-pay

## 2012-09-28 ENCOUNTER — Encounter (HOSPITAL_COMMUNITY)
Admission: RE | Admit: 2012-09-28 | Discharge: 2012-09-28 | Disposition: A | Discharge status: Home / Self Care | Payer: Medicare Other | Source: Ambulatory Visit | Attending: Internal Medicine | Admitting: Internal Medicine

## 2012-09-28 ENCOUNTER — Ambulatory Visit (HOSPITAL_COMMUNITY)
Admission: RE | Admit: 2012-09-28 | Discharge: 2012-09-28 | Disposition: A | Discharge status: Home / Self Care | Payer: Medicare Other | Source: Ambulatory Visit | Attending: Internal Medicine | Admitting: Internal Medicine

## 2012-09-28 ENCOUNTER — Encounter (HOSPITAL_COMMUNITY): Payer: Self-pay

## 2012-09-28 ENCOUNTER — Encounter (HOSPITAL_COMMUNITY): Payer: Self-pay | Admitting: Cardiology

## 2012-09-28 DIAGNOSIS — I5022 Chronic systolic (congestive) heart failure: Secondary | ICD-10-CM | POA: Insufficient documentation

## 2012-09-28 DIAGNOSIS — I471 Supraventricular tachycardia: Secondary | ICD-10-CM

## 2012-09-28 DIAGNOSIS — Z95810 Presence of automatic (implantable) cardiac defibrillator: Secondary | ICD-10-CM | POA: Insufficient documentation

## 2012-09-28 DIAGNOSIS — I2589 Other forms of chronic ischemic heart disease: Secondary | ICD-10-CM

## 2012-09-28 DIAGNOSIS — I428 Other cardiomyopathies: Secondary | ICD-10-CM | POA: Insufficient documentation

## 2012-09-28 DIAGNOSIS — R109 Unspecified abdominal pain: Secondary | ICD-10-CM | POA: Insufficient documentation

## 2012-09-28 MED ORDER — CARVEDILOL 3.125 MG PO TABS: 3.1250 mg | ORAL_TABLET | Freq: Two times a day (BID) | ORAL | Status: DC

## 2012-09-28 MED ORDER — TECHNETIUM TC 99M SESTAMIBI - CARDIOLITE
10.0000 | Freq: Once | INTRAVENOUS | Status: AC | PRN
  Administered 2012-09-28: 10.5 via INTRAVENOUS

## 2012-09-28 MED ORDER — REGADENOSON 0.4 MG/5ML IV SOLN
INTRAVENOUS | Status: AC
  Administered 2012-09-28: 0.4 mg via INTRAVENOUS
  Filled 2012-09-28: qty 5

## 2012-09-28 MED ORDER — SODIUM CHLORIDE 0.9 % IJ SOLN
INTRAMUSCULAR | Status: AC
  Administered 2012-09-28: 10 mL via INTRAVENOUS
  Filled 2012-09-28: qty 10

## 2012-09-28 MED ORDER — TECHNETIUM TC 99M SESTAMIBI - CARDIOLITE
30.0000 | Freq: Once | INTRAVENOUS | Status: AC | PRN
  Administered 2012-09-28: 30 via INTRAVENOUS

## 2012-09-28 NOTE — Progress Notes (Signed)
Stress Lab Nurses Notes - [name]  [name] 09/28/2012 Reason for doing test: Chest Pain & SOB, Patient has a translator with her.  Type of test: Marlane Hatcher Nurse performing test: Parke Poisson, RN Nuclear Medicine Tech: Lou Cal Echo Tech: Not Applicable MD performing test: R. Rothbart Family MD: NA Test explained and consent signed: yes IV started: 22g jelco, Saline lock flushed, No redness or edema and Saline lock started in radiology Symptoms: Stomach discomfort Treatment/Intervention: None Reason test stopped: protocol completed After recovery IV was: Discontinued via X-ray tech and No redness or edema Patient to return to Nuc. Med at : 12:45 Patient discharged: Home Patient's Condition upon discharge was: stable Comments:  During test BP 121/60 & HR 91.  Recovery BP 129/64 & HR 75. Symptoms resolved in recovery.  Erskine Speed T

## 2012-10-04 ENCOUNTER — Telehealth: Payer: Self-pay | Admitting: Internal Medicine

## 2012-10-04 NOTE — Telephone Encounter (Signed)
New problem:  Test results.  

## 2012-10-04 NOTE — Telephone Encounter (Signed)
Please review stress test.  Pt is calling for results.

## 2012-10-13 NOTE — Telephone Encounter (Signed)
F/U   Per Hart Rochester from cardiac rehab. Please advise , need clarification

## 2012-10-13 NOTE — Telephone Encounter (Signed)
Cardiac rehab received an order for a nuclear myoview in their work que.  They want to make sure that this patient needed a nuclear myoview only and not cardiac rehab.

## 2012-10-24 NOTE — Telephone Encounter (Signed)
Yes recommendaton ws a for who  myoview scan thx

## 2012-10-27 ENCOUNTER — Ambulatory Visit (INDEPENDENT_AMBULATORY_CARE_PROVIDER_SITE_OTHER): Payer: Medicare Other | Admitting: Internal Medicine

## 2012-10-27 ENCOUNTER — Encounter: Payer: Self-pay | Admitting: *Deleted

## 2012-10-27 ENCOUNTER — Encounter: Payer: Self-pay | Admitting: Internal Medicine

## 2012-10-27 VITALS — BP 106/67 | HR 78 | Ht 62.0 in | Wt 119.0 lb

## 2012-10-27 DIAGNOSIS — I471 Supraventricular tachycardia: Secondary | ICD-10-CM

## 2012-10-27 DIAGNOSIS — R0989 Other specified symptoms and signs involving the circulatory and respiratory systems: Secondary | ICD-10-CM

## 2012-10-27 DIAGNOSIS — I498 Other specified cardiac arrhythmias: Secondary | ICD-10-CM

## 2012-10-27 DIAGNOSIS — I5022 Chronic systolic (congestive) heart failure: Secondary | ICD-10-CM

## 2012-10-27 DIAGNOSIS — R001 Bradycardia, unspecified: Secondary | ICD-10-CM | POA: Insufficient documentation

## 2012-10-27 DIAGNOSIS — I2589 Other forms of chronic ischemic heart disease: Secondary | ICD-10-CM

## 2012-10-27 LAB — CBC WITH DIFFERENTIAL/PLATELET
Basophils Relative: 1.1 % (ref 0.0–3.0)
Eosinophils Relative: 2 % (ref 0.0–5.0)
HCT: 35.5 % — ABNORMAL LOW (ref 36.0–46.0)
Hemoglobin: 11.5 g/dL — ABNORMAL LOW (ref 12.0–15.0)
Lymphs Abs: 2.2 10*3/uL (ref 0.7–4.0)
MCV: 77 fl — ABNORMAL LOW (ref 78.0–100.0)
Monocytes Absolute: 0.4 10*3/uL (ref 0.1–1.0)
Monocytes Relative: 6.9 % (ref 3.0–12.0)
Neutro Abs: 3.5 10*3/uL (ref 1.4–7.7)
Platelets: 254 10*3/uL (ref 150.0–400.0)
WBC: 6.4 10*3/uL (ref 4.5–10.5)

## 2012-10-27 LAB — ICD DEVICE OBSERVATION
BRDY-0002RV: 40 {beats}/min
RV LEAD AMPLITUDE: 13.6 mv
RV LEAD THRESHOLD: 1 V
TZAT-0001FASTVT: 1
TZAT-0005FASTVT: 88 pct
TZAT-0012SLOWVT: 200 ms
TZAT-0012SLOWVT: 200 ms
TZAT-0013FASTVT: 1
TZAT-0013SLOWVT: 2
TZAT-0013SLOWVT: 2
TZAT-0018FASTVT: NEGATIVE
TZAT-0018SLOWVT: NEGATIVE
TZAT-0019SLOWVT: 8 V
TZAT-0019SLOWVT: 8 V
TZAT-0020FASTVT: 1.6 ms
TZAT-0020SLOWVT: 1.6 ms
TZAT-0020SLOWVT: 1.6 ms
TZON-0003SLOWVT: 370 ms
TZON-0004SLOWVT: 20
TZON-0005SLOWVT: 12
TZON-0008FASTVT: 0 ms
TZON-0008SLOWVT: 0 ms
TZON-0010FASTVT: 30 ms
TZON-0010SLOWVT: 30 ms
TZON-0010VSLOWVT: 30 ms
TZON-0011AFLUTTER: 70
TZST-0001FASTVT: 2
TZST-0001FASTVT: 5
TZST-0001SLOWVT: 4
TZST-0001SLOWVT: 5
TZST-0003FASTVT: 35 J
TZST-0003FASTVT: 35 J
TZST-0003SLOWVT: 35 J
TZST-0003SLOWVT: 35 J
VENTRICULAR PACING ICD: 1 pct

## 2012-10-27 LAB — BASIC METABOLIC PANEL
BUN: 13 mg/dL (ref 6–23)
Chloride: 104 mEq/L (ref 96–112)
GFR: 71.27 mL/min (ref >60.00)
Potassium: 4.3 mEq/L (ref 3.5–5.1)
Sodium: 138 mEq/L (ref 135–145)

## 2012-10-27 LAB — PROTIME-INR: Prothrombin Time: 10.5 s (ref 10.2–12.4)

## 2012-10-27 NOTE — Progress Notes (Signed)
  HPI  [name] is a 71 y.o. 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 She is status post ICD implantation. She is a 6949 in place  She continues to suffer from exercise intolerance and intermittent chest pain. Myoview scan demonstrated significant infarct and peri-infarct ischemia of modest significance. She does not have edema  She is here with a translator today.   Abjo=goodbye  Kamcho==hello how are you  Past Medical History  Diagnosis Date  . PSVT (paroxysmal supraventricular tachycardia)   . Ischemic cardiomyopathy     Anterior MI 2005 with primary PCI  . Automatic implantable cardiac defibrillator in situ   . Chronic systolic heart failure   . 6949 lead 09/06/2011    Past Surgical History  Procedure Date  . Cardiac defibrillator placement     [employer]    Current Outpatient Prescriptions  Medication Sig Dispense Refill  . carvedilol (COREG) 3.125 MG tablet Take 1 tablet (3.125 mg total) by mouth 2 (two) times daily with a meal.  60 tablet  5  . clopidogrel (PLAVIX) 75 MG tablet Take 1 tablet (75 mg total) by mouth daily.  30 tablet  11  . diclofenac sodium (VOLTAREN) 1 % GEL Apply 1 application topically.        . gabapentin (NEURONTIN) 100 MG capsule Take 100 mg by mouth at bedtime.        . lisinopril (PRINIVIL,ZESTRIL) 2.5 MG tablet Take 2.5 mg by mouth daily.        . omeprazole (PRILOSEC) 20 MG capsule Take 20 mg by mouth 2 (two) times daily.        . simvastatin (ZOCOR) 20 MG tablet Take 20 mg by mouth at bedtime.          No Known Allergies  Review of Systems negative except from HPI and PMH  Physical Exam BP 106/67  Pulse 78  Ht 5' 2" (1.575 m)  Wt 119 lb (53.978 kg)  BMI 21.77 kg/m2  Well developed and well nourished in no acute distress HENT normal E scleral and icterus clear Neck Supple JVP 7-8 Clear to ausculation Regular rate and rhythm, no murmurs gallops or rub Soft with active bowel sounds No  clubbing cyanosis and edema Alert and oriented, grossly normal motor and sensory function Skin Warm and Dry  ECG n sinus rhythm at 52 Intervals 09/26/43 no evidence of ventricular preexcitation T-wave inversions laterally no change from a year ago but we don't need to do a sedimentation rate we didn't last  Assessment and  Plan  

## 2012-10-27 NOTE — Assessment & Plan Note (Signed)
Significant symptoms of dyspnea with exertional chest pain. Her Myoview is abnormal. She has modest right sided fluid overload. We'll undertake catheterization-right and left revascularization if indicated. I have reviewed this with her family through her translator.

## 2012-10-27 NOTE — Assessment & Plan Note (Signed)
As above.

## 2012-10-27 NOTE — Patient Instructions (Signed)

## 2012-10-27 NOTE — Assessment & Plan Note (Signed)
As above She is a candidate for aldosterone antagonists if her blood pressure will tolerate. Her heart rate excursion is adequate. This is true not withstanding her sinus bradycardia

## 2012-10-30 ENCOUNTER — Inpatient Hospital Stay (HOSPITAL_COMMUNITY)
Admission: RE | Admit: 2012-10-30 | Discharge: 2012-11-10 | DRG: 234 | Disposition: A | Discharge status: Home Health | Payer: Medicare Other | Source: Ambulatory Visit | Attending: Surgery | Admitting: Surgery

## 2012-10-30 ENCOUNTER — Other Ambulatory Visit: Payer: Self-pay | Admitting: *Deleted

## 2012-10-30 ENCOUNTER — Encounter (HOSPITAL_COMMUNITY)
Admission: RE | Disposition: A | Discharge status: Home Health | Payer: Self-pay | Source: Ambulatory Visit | Attending: Surgery

## 2012-10-30 ENCOUNTER — Encounter (HOSPITAL_COMMUNITY): Payer: Self-pay | Admitting: *Deleted

## 2012-10-30 DIAGNOSIS — I509 Heart failure, unspecified: Secondary | ICD-10-CM | POA: Diagnosis present

## 2012-10-30 DIAGNOSIS — D509 Iron deficiency anemia, unspecified: Secondary | ICD-10-CM | POA: Diagnosis present

## 2012-10-30 DIAGNOSIS — I5022 Chronic systolic (congestive) heart failure: Secondary | ICD-10-CM | POA: Diagnosis present

## 2012-10-30 DIAGNOSIS — I2510 Atherosclerotic heart disease of native coronary artery without angina pectoris: Secondary | ICD-10-CM

## 2012-10-30 DIAGNOSIS — D696 Thrombocytopenia, unspecified: Secondary | ICD-10-CM | POA: Diagnosis present

## 2012-10-30 DIAGNOSIS — K5900 Constipation, unspecified: Secondary | ICD-10-CM | POA: Diagnosis present

## 2012-10-30 DIAGNOSIS — I208 Other forms of angina pectoris: Secondary | ICD-10-CM

## 2012-10-30 DIAGNOSIS — I2589 Other forms of chronic ischemic heart disease: Secondary | ICD-10-CM | POA: Diagnosis present

## 2012-10-30 DIAGNOSIS — I2089 Other forms of angina pectoris: Secondary | ICD-10-CM

## 2012-10-30 DIAGNOSIS — Z23 Encounter for immunization: Secondary | ICD-10-CM

## 2012-10-30 DIAGNOSIS — E875 Hyperkalemia: Secondary | ICD-10-CM | POA: Diagnosis present

## 2012-10-30 DIAGNOSIS — Z95810 Presence of automatic (implantable) cardiac defibrillator: Secondary | ICD-10-CM

## 2012-10-30 DIAGNOSIS — Z951 Presence of aortocoronary bypass graft: Secondary | ICD-10-CM

## 2012-10-30 DIAGNOSIS — Z87891 Personal history of nicotine dependence: Secondary | ICD-10-CM

## 2012-10-30 HISTORY — PX: LEFT AND RIGHT HEART CATHETERIZATION WITH CORONARY ANGIOGRAM: SHX5449

## 2012-10-30 LAB — POCT I-STAT 3, VENOUS BLOOD GAS (G3P V)
Acid-base deficit: 1 mmol/L (ref 0.0–2.0)
O2 Saturation: 63 %
pO2, Ven: 34 mmHg (ref 30.0–45.0)

## 2012-10-30 LAB — POCT I-STAT 3, ART BLOOD GAS (G3+)
Bicarbonate: 23.8 mEq/L (ref 20.0–24.0)
TCO2: 25 mmol/L (ref 0–100)
pH, Arterial: 7.369 (ref 7.350–7.450)
pO2, Arterial: 62 mmHg — ABNORMAL LOW (ref 80.0–100.0)

## 2012-10-30 LAB — SURGICAL PCR SCREEN
MRSA, PCR: NEGATIVE
Staphylococcus aureus: NEGATIVE

## 2012-10-30 LAB — PLATELET INHIBITION P2Y12: Platelet Function  P2Y12: 230 [PRU] (ref 194–418)

## 2012-10-30 SURGERY — LEFT AND RIGHT HEART CATHETERIZATION WITH CORONARY ANGIOGRAM
Anesthesia: LOCAL

## 2012-10-30 MED ORDER — GABAPENTIN 100 MG PO CAPS
100.0000 mg | ORAL_CAPSULE | Freq: Every day | ORAL | Status: DC
  Administered 2012-10-30 – 2012-11-09 (×10): 100 mg via ORAL
  Filled 2012-10-30 (×12): qty 1

## 2012-10-30 MED ORDER — ASPIRIN 81 MG PO CHEW
CHEWABLE_TABLET | ORAL | Status: AC
  Filled 2012-10-30: qty 4

## 2012-10-30 MED ORDER — FENTANYL CITRATE 0.05 MG/ML IJ SOLN
INTRAMUSCULAR | Status: AC
  Filled 2012-10-30: qty 2

## 2012-10-30 MED ORDER — SODIUM CHLORIDE 0.9 % IJ SOLN: 3.0000 mL | INTRAMUSCULAR | Status: DC | PRN

## 2012-10-30 MED ORDER — SODIUM CHLORIDE 0.9 % IV SOLN: 250.0000 mL | INTRAVENOUS | Status: DC | PRN

## 2012-10-30 MED ORDER — LISINOPRIL 2.5 MG PO TABS
2.5000 mg | ORAL_TABLET | Freq: Every day | ORAL | Status: DC
  Administered 2012-10-31 – 2012-11-02 (×3): 2.5 mg via ORAL
  Filled 2012-10-30 (×4): qty 1

## 2012-10-30 MED ORDER — SODIUM CHLORIDE 0.9 % IV SOLN: 1.0000 mL/kg/h | INTRAVENOUS | Status: AC

## 2012-10-30 MED ORDER — MIDAZOLAM HCL 2 MG/2ML IJ SOLN
INTRAMUSCULAR | Status: AC
  Filled 2012-10-30: qty 2

## 2012-10-30 MED ORDER — OXYCODONE-ACETAMINOPHEN 5-325 MG PO TABS: 1.0000 | ORAL_TABLET | ORAL | Status: DC | PRN

## 2012-10-30 MED ORDER — HEPARIN (PORCINE) IN NACL 100-0.45 UNIT/ML-% IJ SOLN
900.0000 [IU]/h | INTRAMUSCULAR | Status: DC
  Administered 2012-10-30: 750 [IU]/h via INTRAVENOUS
  Administered 2012-10-31: 900 [IU]/h via INTRAVENOUS
  Filled 2012-10-30: qty 250

## 2012-10-30 MED ORDER — SODIUM CHLORIDE 0.9 % IV SOLN: INTRAVENOUS | Status: DC

## 2012-10-30 MED ORDER — SODIUM CHLORIDE 0.9 % IJ SOLN
3.0000 mL | Freq: Two times a day (BID) | INTRAMUSCULAR | Status: DC
  Administered 2012-10-31 – 2012-11-02 (×6): 3 mL via INTRAVENOUS

## 2012-10-30 MED ORDER — HEPARIN (PORCINE) IN NACL 2-0.9 UNIT/ML-% IJ SOLN
INTRAMUSCULAR | Status: AC
  Filled 2012-10-30: qty 1000

## 2012-10-30 MED ORDER — ONDANSETRON HCL 4 MG/2ML IJ SOLN: 4.0000 mg | Freq: Four times a day (QID) | INTRAMUSCULAR | Status: DC | PRN

## 2012-10-30 MED ORDER — NITROGLYCERIN 0.2 MG/ML ON CALL CATH LAB
INTRAVENOUS | Status: AC
  Filled 2012-10-30: qty 1

## 2012-10-30 MED ORDER — PANTOPRAZOLE SODIUM 40 MG PO TBEC
40.0000 mg | DELAYED_RELEASE_TABLET | Freq: Every day | ORAL | Status: DC
  Administered 2012-10-31 – 2012-11-02 (×3): 40 mg via ORAL
  Filled 2012-10-30 (×3): qty 1

## 2012-10-30 MED ORDER — SIMVASTATIN 20 MG PO TABS
20.0000 mg | ORAL_TABLET | Freq: Every day | ORAL | Status: DC
  Administered 2012-10-30 – 2012-11-09 (×10): 20 mg via ORAL
  Filled 2012-10-30 (×12): qty 1

## 2012-10-30 MED ORDER — ASPIRIN 81 MG PO CHEW: 324.0000 mg | CHEWABLE_TABLET | ORAL | Status: DC

## 2012-10-30 MED ORDER — ACETAMINOPHEN 325 MG PO TABS: 650.0000 mg | ORAL_TABLET | ORAL | Status: DC | PRN

## 2012-10-30 MED ORDER — SODIUM CHLORIDE 0.9 % IJ SOLN: 3.0000 mL | Freq: Two times a day (BID) | INTRAMUSCULAR | Status: DC

## 2012-10-30 MED ORDER — INFLUENZA VIRUS VACC SPLIT PF IM SUSP
0.5000 mL | INTRAMUSCULAR | Status: DC
  Filled 2012-10-30: qty 0.5

## 2012-10-30 MED ORDER — PNEUMOCOCCAL VAC POLYVALENT 25 MCG/0.5ML IJ INJ
0.5000 mL | INJECTION | INTRAMUSCULAR | Status: DC
  Filled 2012-10-30: qty 0.5

## 2012-10-30 MED ORDER — CARVEDILOL 3.125 MG PO TABS
3.1250 mg | ORAL_TABLET | Freq: Two times a day (BID) | ORAL | Status: DC
  Administered 2012-10-31 – 2012-11-02 (×6): 3.125 mg via ORAL
  Filled 2012-10-30 (×10): qty 1

## 2012-10-30 MED ORDER — LIDOCAINE HCL (PF) 1 % IJ SOLN
INTRAMUSCULAR | Status: AC
  Filled 2012-10-30: qty 30

## 2012-10-30 NOTE — CV Procedure (Signed)
   Cardiac Catheterization Procedure Note  Name: [name] MRN: [id number] DOB: [birth year]  Procedure: Right Heart Cath, Left Heart Cath, Selective Coronary Angiography, LV angiography  Indication: CCS Class 3 angina, progressive. Abnormal Myoview scan with infarction and peri-infarct ischemia. Severe LV dysfunction.   Procedural Details: The right groin was prepped, draped, and anesthetized with 1% lidocaine. Using the modified Seldinger technique a 5 French sheath was placed in the right femoral artery and a 6 French sheath was placed in the right femoral vein. A multipurpose catheter was used for the right heart catheterization. Standard protocol was followed for recording of right heart pressures and sampling of oxygen saturations. Fick cardiac output was calculated. Standard Judkins catheters were used for selective coronary angiography and left ventriculography. There were no immediate procedural complications. The patient was transferred to the post catheterization recovery area for further monitoring.  Procedural Findings: Hemodynamics RA 2 RV 23/2 PA 25/10 mean 16 PCWP 6 LV 129/10 AO 129/57  Oxygen saturations: PA 63 AO 91  Cardiac Output (Fick) 4.7  Cardiac Index (Fick) 3.0   Coronary angiography: Coronary dominance: right  Left mainstem: Heavily calcified, mid shaft left main 95% stenosis  Left anterior descending (LAD): Small caliber vessel throughout. The proximal vessel has severe stenosis with 80% tandem lesions present. The mid and distal vessels are small and diffusely diseased.  Left circumflex (LCx): The LCx is small to moderate in caliber. There is a small intermediate and 2 moderate caliber OM branches without significant stenoses.  Right coronary artery (RCA): Large, dominant vessel with moderate diffuse calcification. The proximal vessel has 70% stenosis with segmental disease throughout the mid-vessel. The distal RCA just before it bifurcates into  the PDA and PLA branches has 50% stenosis. The PDA and PLA branches are patent.  Left ventriculography: The anteroapical walls are akinetic. The basal anterior and basal inferior walls contract normally. The LVEF is estimated at 35%.  Final Conclusions:   1. Severe left main disease 2. Severe proximal LAD stenosis 3. Patent LCx 4. Severe RCA stenosis 5. Severe segmental LV dysfunction 6. Normal intracardiac hemodynamics  Recommendations: Admit to hospital because of high-risk coronary anatomy with severe left main disease. Start IV heparin and hold plavix. TCTS consultation for consideration of CABG.   [name] 10/30/2012, 2:50 PM

## 2012-10-30 NOTE — Interval H&P Note (Signed)
History and Physical Interval Note:  10/30/2012 1:26 PM  [name]  has presented today for surgery, with the diagnosis of CP  The various methods of treatment have been discussed with the patient and family. After consideration of risks, benefits and other options for treatment, the patient has consented to  Procedure(s) (LRB) with comments: LEFT AND RIGHT HEART CATHETERIZATION WITH CORONARY ANGIOGRAM (N/A) as a surgical intervention .  The patient's history has been reviewed, patient examined, no change in status, stable for surgery.  I have reviewed the patient's chart and labs.  Questions were answered to the patient's satisfaction.     [name]

## 2012-10-30 NOTE — Progress Notes (Signed)
ANTICOAGULATION CONSULT NOTE - Initial Consult  Pharmacy Consult for heparin Indication: chest pain/ACS  No Known Allergies  Patient Measurements: Height: 5\' 2"  (157.5 cm) Weight: 119 lb (53.978 kg) IBW/kg (Calculated) : 50.1  Heparin Dosing Weight: 54  Vital Signs: Temp: 97.9 F (36.6 C) (01/13 1850) Temp src: Oral (01/13 1850) BP: 132/55 mmHg (01/13 1850) Pulse Rate: 66  (01/13 1325)  Labs: No results found for this basename: HGB:2,HCT:3,PLT:3,APTT:3,LABPROT:3,INR:3,HEPARINUNFRC:3,CREATININE:3,CKTOTAL:3,CKMB:3,TROPONINI:3 in the last 72 hours  Estimated Creatinine Clearance: 52.5 ml/min (by C-G formula based on Cr of 0.8).   Medical History: Past Medical History  Diagnosis Date  . PSVT (paroxysmal supraventricular tachycardia)   . Ischemic cardiomyopathy     Anterior MI 2005 with primary PCI  . Automatic implantable cardiac defibrillator in situ   . Chronic systolic heart failure   . 4782 lead 09/06/2011    Medications:  Prescriptions prior to admission  Medication Sig Dispense Refill  . carvedilol (COREG) 3.125 MG tablet Take 1 tablet (3.125 mg total) by mouth 2 (two) times daily with a meal.  60 tablet  5  . clopidogrel (PLAVIX) 75 MG tablet Take 1 tablet (75 mg total) by mouth daily.  30 tablet  11  . diclofenac sodium (VOLTAREN) 1 % GEL Apply 1 application topically.        . gabapentin (NEURONTIN) 100 MG capsule Take 100 mg by mouth at bedtime.        [name] Kitchen lisinopril (PRINIVIL,ZESTRIL) 2.5 MG tablet Take 2.5 mg by mouth daily.        [name] Kitchen omeprazole (PRILOSEC) 20 MG capsule Take 20 mg by mouth 2 (two) times daily.        . simvastatin (ZOCOR) 20 MG tablet Take 20 mg by mouth at bedtime.         Scheduled:    . aspirin      . carvedilol  3.125 mg Oral BID WC  . [COMPLETED] fentaNYL      . gabapentin  100 mg Oral QHS  . [COMPLETED] heparin      . [COMPLETED] lidocaine      . lisinopril  2.5 mg Oral Daily  . [COMPLETED] midazolam      . [COMPLETED]  nitroGLYCERIN      . pantoprazole  40 mg Oral Daily  . simvastatin  20 mg Oral QHS  . sodium chloride  3 mL Intravenous Q12H  . [DISCONTINUED] aspirin  324 mg Oral Pre-Cath  . [DISCONTINUED] sodium chloride  3 mL Intravenous Q12H    Assessment: 72 yo with CP who is s/p cath. 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 IV heparin has been ordered while evaluating for CABG.   Goal of Therapy:  Heparin level 0.3-0.7 units/ml Monitor platelets by anticoagulation protocol: Yes   Plan:  Heparin drip at 750 units/hr F/u with AM heparin level Daily CBC  Ulyses Southward [place] 10/30/2012,7:07 PM

## 2012-10-30 NOTE — Progress Notes (Signed)
Transferred- in from the cath lab awake and alert.  Bed rest emphasized and not to bend right  Knee, pulses are palpable.

## 2012-10-30 NOTE — H&P (View-Only) (Signed)
  HPI  [name] is a 72 y.o. 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 She is status post ICD implantation. She is a 6949 in place  She continues to suffer from exercise intolerance and intermittent chest pain. Myoview scan demonstrated significant infarct and peri-infarct ischemia of modest significance. She does not have edema  She is here with a translator today.   Abjo=goodbye  Kamcho==hello how are you  Past Medical History  Diagnosis Date  . PSVT (paroxysmal supraventricular tachycardia)   . Ischemic cardiomyopathy     Anterior MI 2005 with primary PCI  . Automatic implantable cardiac defibrillator in situ   . Chronic systolic heart failure   . 1610 lead 09/06/2011    Past Surgical History  Procedure Date  . Cardiac defibrillator placement     [employer]    Current Outpatient Prescriptions  Medication Sig Dispense Refill  . carvedilol (COREG) 3.125 MG tablet Take 1 tablet (3.125 mg total) by mouth 2 (two) times daily with a meal.  60 tablet  5  . clopidogrel (PLAVIX) 75 MG tablet Take 1 tablet (75 mg total) by mouth daily.  30 tablet  11  . diclofenac sodium (VOLTAREN) 1 % GEL Apply 1 application topically.        . gabapentin (NEURONTIN) 100 MG capsule Take 100 mg by mouth at bedtime.        [name] Kitchen lisinopril (PRINIVIL,ZESTRIL) 2.5 MG tablet Take 2.5 mg by mouth daily.        [name] Kitchen omeprazole (PRILOSEC) 20 MG capsule Take 20 mg by mouth 2 (two) times daily.        . simvastatin (ZOCOR) 20 MG tablet Take 20 mg by mouth at bedtime.          No Known Allergies  Review of Systems negative except from HPI and PMH  Physical Exam BP 106/67  Pulse 78  Ht 5\' 2"  (1.575 m)  Wt 119 lb (53.978 kg)  BMI 21.77 kg/m2  Well developed and well nourished in no acute distress HENT normal E scleral and icterus clear Neck Supple JVP 7-8 Clear to ausculation Regular rate and rhythm, no murmurs gallops or rub Soft with active bowel sounds No  clubbing cyanosis and edema Alert and oriented, grossly normal motor and sensory function Skin Warm and Dry  ECG n sinus rhythm at 52 Intervals 09/26/43 no evidence of ventricular preexcitation T-wave inversions laterally no change from a year ago but we don't need to do a sedimentation rate we didn't last  Assessment and  Plan

## 2012-10-31 ENCOUNTER — Inpatient Hospital Stay (HOSPITAL_COMMUNITY): Payer: Medicare Other

## 2012-10-31 DIAGNOSIS — I209 Angina pectoris, unspecified: Secondary | ICD-10-CM

## 2012-10-31 DIAGNOSIS — I208 Other forms of angina pectoris: Secondary | ICD-10-CM

## 2012-10-31 DIAGNOSIS — Z01810 Encounter for preprocedural cardiovascular examination: Secondary | ICD-10-CM

## 2012-10-31 DIAGNOSIS — I2510 Atherosclerotic heart disease of native coronary artery without angina pectoris: Secondary | ICD-10-CM

## 2012-10-31 LAB — HEPARIN LEVEL (UNFRACTIONATED): Heparin Unfractionated: 1.05 IU/mL — ABNORMAL HIGH (ref 0.30–0.70)

## 2012-10-31 LAB — CBC
HCT: 31.6 % — ABNORMAL LOW (ref 36.0–46.0)
Hemoglobin: 10.4 g/dL — ABNORMAL LOW (ref 12.0–15.0)
MCV: 76.9 fL — ABNORMAL LOW (ref 78.0–100.0)
RBC: 4.11 MIL/uL (ref 3.87–5.11)
WBC: 4.8 10*3/uL (ref 4.0–10.5)

## 2012-10-31 MED ORDER — PNEUMOCOCCAL VAC POLYVALENT 25 MCG/0.5ML IJ INJ: 0.5000 mL | INJECTION | INTRAMUSCULAR | Status: DC

## 2012-10-31 MED ORDER — INFLUENZA VIRUS VACC SPLIT PF IM SUSP: 0.5000 mL | INTRAMUSCULAR | Status: DC

## 2012-10-31 MED ORDER — HEPARIN (PORCINE) IN NACL 100-0.45 UNIT/ML-% IJ SOLN
850.0000 [IU]/h | INTRAMUSCULAR | Status: DC
  Filled 2012-10-31: qty 250

## 2012-10-31 MED ORDER — DOCUSATE SODIUM 100 MG PO CAPS
100.0000 mg | ORAL_CAPSULE | Freq: Two times a day (BID) | ORAL | Status: DC
  Administered 2012-10-31 – 2012-11-02 (×4): 100 mg via ORAL
  Filled 2012-10-31 (×7): qty 1

## 2012-10-31 MED ORDER — ENSURE COMPLETE PO LIQD
237.0000 mL | Freq: Three times a day (TID) | ORAL | Status: DC
  Administered 2012-10-31 – 2012-11-02 (×7): 237 mL via ORAL

## 2012-10-31 MED ORDER — SODIUM CHLORIDE 0.9 % IV SOLN: INTRAVENOUS | Status: DC

## 2012-10-31 MED ORDER — HEPARIN (PORCINE) IN NACL 100-0.45 UNIT/ML-% IJ SOLN
650.0000 [IU]/h | INTRAMUSCULAR | Status: DC
  Administered 2012-10-31 – 2012-11-02 (×3): 650 [IU]/h via INTRAVENOUS
  Filled 2012-10-31 (×3): qty 250

## 2012-10-31 MED ORDER — ALBUTEROL SULFATE (5 MG/ML) 0.5% IN NEBU
2.5000 mg | INHALATION_SOLUTION | Freq: Once | RESPIRATORY_TRACT | Status: AC
  Administered 2012-10-31: 2.5 mg via RESPIRATORY_TRACT

## 2012-10-31 NOTE — Progress Notes (Signed)
ANTICOAGULATION CONSULT NOTE - Follow Up Consult  Pharmacy Consult for heparin Indication: chest pain/ACS  No Known Allergies  Patient Measurements: Height: 5\' 2"  (157.5 cm) Weight: 119 lb (53.978 kg) IBW/kg (Calculated) : 50.1  Heparin Dosing Weight: 54  Vital Signs: Temp: 97.8 F (36.6 C) (01/14 0330) Temp src: Oral (01/14 0330) BP: 99/50 mmHg (01/14 0400) Pulse Rate: 71  (01/14 0400)  Labs:  Basename 10/31/12 0445  HGB 10.4*  HCT 31.6*  PLT 212  APTT --  LABPROT --  INR --  HEPARINUNFRC 0.20*  CREATININE --  CKTOTAL --  CKMB --  TROPONINI --    Estimated Creatinine Clearance: 52.5 ml/min (by C-G formula based on Cr of 0.8).   Medical History: Past Medical History  Diagnosis Date  . PSVT (paroxysmal supraventricular tachycardia)   . Ischemic cardiomyopathy     Anterior MI 2005 with primary PCI  . Automatic implantable cardiac defibrillator in situ   . Chronic systolic heart failure   . 4098 lead 09/06/2011    Medications:  Prescriptions prior to admission  Medication Sig Dispense Refill  . carvedilol (COREG) 3.125 MG tablet Take 1 tablet (3.125 mg total) by mouth 2 (two) times daily with a meal.  60 tablet  5  . clopidogrel (PLAVIX) 75 MG tablet Take 1 tablet (75 mg total) by mouth daily.  30 tablet  11  . diclofenac sodium (VOLTAREN) 1 % GEL Apply 1 application topically.        . gabapentin (NEURONTIN) 100 MG capsule Take 100 mg by mouth at bedtime.        [name] Kitchen lisinopril (PRINIVIL,ZESTRIL) 2.5 MG tablet Take 2.5 mg by mouth daily.        [name] Kitchen omeprazole (PRILOSEC) 20 MG capsule Take 20 mg by mouth 2 (two) times daily.        . simvastatin (ZOCOR) 20 MG tablet Take 20 mg by mouth at bedtime.         Scheduled:     . [EXPIRED] aspirin      . carvedilol  3.125 mg Oral BID WC  . [COMPLETED] fentaNYL      . gabapentin  100 mg Oral QHS  . [COMPLETED] heparin      . influenza  inactive virus vaccine  0.5 mL Intramuscular Tomorrow-1000  . [COMPLETED]  lidocaine      . lisinopril  2.5 mg Oral Daily  . [COMPLETED] midazolam      . [COMPLETED] nitroGLYCERIN      . pantoprazole  40 mg Oral Daily  . pneumococcal 23 valent vaccine  0.5 mL Intramuscular Tomorrow-1000  . simvastatin  20 mg Oral QHS  . sodium chloride  3 mL Intravenous Q12H  . [DISCONTINUED] aspirin  324 mg Oral Pre-Cath  . [DISCONTINUED] sodium chloride  3 mL Intravenous Q12H    Assessment: 72 yo with CP who is s/p cath. Found to have multivessels disease. IV heparin has been ordered while evaluating for CABG.   Heparin level (0.2) is below-goal on 750 units/hr. No problem with line /infusion and no bleeding per RN.   Goal of Therapy:  Heparin level 0.3-0.7 units/ml Monitor platelets by anticoagulation protocol: Yes   Plan:  1. Increase IV heparin to 900 units/hr.  2. Heparin level in 6 hours.   Emeline Gins 10/31/2012,5:35 AM

## 2012-10-31 NOTE — Progress Notes (Signed)
ANTICOAGULATION CONSULT NOTE - Follow Up Consult  Pharmacy Consult for heparin Indication: chest pain/ACS  No Known Allergies  Patient Measurements: Height: 5\' 2"  (157.5 cm) Weight: 119 lb (53.978 kg) IBW/kg (Calculated) : 50.1   Vital Signs: Temp: 97.9 F (36.6 C) (01/14 2003) Temp src: Oral (01/14 2003) BP: 104/47 mmHg (01/14 1623) Pulse Rate: 68  (01/14 1623)  Labs:  Basename 10/31/12 2044 10/31/12 1118 10/31/12 0445  HGB -- -- 10.4*  HCT -- -- 31.6*  PLT -- -- 212  APTT -- -- --  LABPROT -- -- --  INR -- -- --  HEPARINUNFRC 1.05* 0.82* 0.20*  CREATININE -- -- --  CKTOTAL -- -- --  CKMB -- -- --  TROPONINI -- -- --    Estimated Creatinine Clearance: 52.5 ml/min (by C-G formula based on Cr of 0.8).  Assessment: 72 yo with CP who is s/p cath. Found to have multivessel dz. Pt on IV heparin while awaitng CABG -planned for Friday. Heparin level is 1.05 (supratherapeutic) on 850 units/hr. No bleeding noted. Lab appears to have been drawn correctly ?pt accumulating.  Goal of Therapy:  Heparin level 0.3-0.7 units/ml Monitor platelets by anticoagulation protocol: Yes   Plan:  -Hold heparin x 1 hour then restart at 650 units/hr. -F/u a.m. Heparin level  Christoper Fabian, PharmD, BCPS Clinical pharmacist, pager [phone]  10/31/2012 9:33 PM

## 2012-10-31 NOTE — Progress Notes (Signed)
ANTICOAGULATION CONSULT NOTE - Follow Up Consult  Pharmacy Consult for heparin Indication: chest pain/ACS  No Known Allergies  Patient Measurements: Height: 5\' 2"  (157.5 cm) Weight: 119 lb (53.978 kg) IBW/kg (Calculated) : 50.1   Vital Signs: Temp: 97.7 F (36.5 C) (01/14 1145) Temp src: Oral (01/14 1145) BP: 89/46 mmHg (01/14 1232) Pulse Rate: 65  (01/14 1145)  Labs:  Basename 10/31/12 1118 10/31/12 0445  HGB -- 10.4*  HCT -- 31.6*  PLT -- 212  APTT -- --  LABPROT -- --  INR -- --  HEPARINUNFRC 0.82* 0.20*  CREATININE -- --  CKTOTAL -- --  CKMB -- --  TROPONINI -- --    Estimated Creatinine Clearance: 52.5 ml/min (by C-G formula based on Cr of 0.8).   Medications:  Scheduled:    . [COMPLETED] albuterol  2.5 mg Nebulization Once  . [EXPIRED] aspirin      . carvedilol  3.125 mg Oral BID WC  . feeding supplement  237 mL Oral TID BM  . gabapentin  100 mg Oral QHS  . [COMPLETED] heparin      . influenza  inactive virus vaccine  0.5 mL Intramuscular Tomorrow-1000  . [COMPLETED] lidocaine      . lisinopril  2.5 mg Oral Daily  . [COMPLETED] nitroGLYCERIN      . pantoprazole  40 mg Oral Daily  . pneumococcal 23 valent vaccine  0.5 mL Intramuscular Tomorrow-1000  . simvastatin  20 mg Oral QHS  . sodium chloride  3 mL Intravenous Q12H  . [DISCONTINUED] aspirin  324 mg Oral Pre-Cath  . [DISCONTINUED] influenza  inactive virus vaccine  0.5 mL Intramuscular Tomorrow-1000  . [DISCONTINUED] pneumococcal 23 valent vaccine  0.5 mL Intramuscular Tomorrow-1000  . [DISCONTINUED] sodium chloride  3 mL Intravenous Q12H   Infusions:    . [EXPIRED] sodium chloride 0.185 mL/kg/hr (10/31/12 0300)  . sodium chloride 10 mL/hr at 10/31/12 0700  . heparin 900 Units/hr (10/31/12 0700)  . [DISCONTINUED] sodium chloride      Assessment: 72 yo with CP who is s/p cath. 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 IV heparin has been ordered while evaluating for CABG. Heparin level is 0.82 at 750  units/hr,   Goal of Therapy:  Heparin level 0.3-0.7 units/ml Monitor platelets by anticoagulation protocol: Yes   Plan:  -Decrease heparin to 850 units/hr -Heparin level in 6hrs  Harland German, [place] 10/31/2012 1:59 PM

## 2012-10-31 NOTE — Progress Notes (Addendum)
VASCULAR LAB PRELIMINARY  PRELIMINARY  PRELIMINARY  PRELIMINARY  Pre-op Cardiac Surgery  Carotid Findings:  Right:  40-59% internal carotid artery stenosis.  Left:  No evidence of hemodynamically significant internal carotid artery stenosis.  Bilateral:  Vertebral artery flow is antegrade.     Thereasa Parkin, RVT   Upper Extremity Right Left  Brachial Pressures 102-Biphasic 96-Biphasic  Radial Waveforms Biphasic Biphasic  Ulnar Waveforms Biphasic Biphasic  Palmar Arch (Allen's Test) Obliterates with radial compression, is unaffected with ulnar compression. Obliterates with radial compression, is unaffected with ulnar compression.   Findings:   Bilateral palpable pedal pulses.  11/01/2012 2:27 PM Gertie Fey, RDMS, RDCS

## 2012-10-31 NOTE — Progress Notes (Signed)
Notified Md about laxative per pt's request.  New orders received.  Will continue to monitor. [name]

## 2012-10-31 NOTE — Progress Notes (Signed)
Cardiology Progress Note Patient Name: [name] Date of Encounter: 10/31/2012, 10:08 AM     Subjective  Patient son at bedside to translate. Patient denies chest pain.   Objective   Telemetry: Sinus rhythm 60-70s  Medications: . carvedilol  3.125 mg Oral BID WC  . feeding supplement  237 mL Oral TID BM  . gabapentin  100 mg Oral QHS  . influenza  inactive virus vaccine  0.5 mL Intramuscular Tomorrow-1000  . lisinopril  2.5 mg Oral Daily  . pantoprazole  40 mg Oral Daily  . pneumococcal 23 valent vaccine  0.5 mL Intramuscular Tomorrow-1000  . simvastatin  20 mg Oral QHS  . sodium chloride  3 mL Intravenous Q12H   . sodium chloride 10 mL/hr at 10/31/12 0700  . heparin 900 Units/hr (10/31/12 0700)    Physical Exam: Temp:  [97.8 F (36.6 C)-98.4 F (36.9 C)] 98.4 F (36.9 C) (01/14 0715) Pulse Rate:  [59-75] 60  (01/14 0800) Resp:  [14-18] 14  (01/14 0800) BP: (92-134)/(38-70) 117/48 mmHg (01/14 0800) SpO2:  [96 %-100 %] 98 % (01/14 0800) Weight:  [119 lb (53.978 kg)] 119 lb (53.978 kg) (01/13 1024)  General: Well developed female, in no acute distress. Head: Normocephalic, atraumatic, sclera non-icteric, nares are without discharge.  Neck: Supple. Negative for carotid bruits or JVD Lungs: Clear bilaterally to auscultation without wheezes, rales, or rhonchi. Breathing is unlabored. Heart: RRR S1 S2 without murmurs, rubs, or gallops.  Abdomen: Soft, non-tender, non-distended with normoactive bowel sounds. No rebound/guarding. No obvious abdominal masses. Msk:  Strength and tone appear normal for age. Extremities: Right groin without bleeding, hematoma, bruit. No edema. No clubbing or cyanosis. Distal pedal pulses are intact and equal bilaterally. Neuro: Alert and oriented X 3. Moves all extremities spontaneously. Psych:  Responds to questions appropriately with a normal affect.   Intake/Output Summary (Last 24 hours) at 10/31/12 1008 Last data filed at  10/31/12 0900  Gross per 24 hour  Intake 492.78 ml  Output    450 ml  Net  42.78 ml    Labs:  [facility] 10/31/12 0445  WBC 4.8  HGB 10.4*  HCT 31.6*  MCV 76.9*  PLT 212     10/30/2012 19:45  Platelet Function  P2Y12 230     10/27/2012 13:12  Sodium 138  Potassium 4.3  Chloride 104  CO2 27  BUN 13  Creatinine 0.8  Calcium 9.5  Glucose 81  GFR 71.27   Radiology/Studies:   10/30/12 - R/L Cardiac Cath Hemodynamics  RA 2  RV 23/2  PA 25/10 mean 16  PCWP 6  LV 129/10  AO 129/57  Oxygen saturations:  PA 63  AO 91  Cardiac Output (Fick) 4.7  Cardiac Index (Fick) 3.0  Coronary angiography:  Coronary dominance: right  Left mainstem: Heavily calcified, mid shaft left main 95% stenosis  Left anterior descending (LAD): Small caliber vessel throughout. The proximal vessel has severe stenosis with 80% tandem lesions present. The mid and distal vessels are small and diffusely diseased.  Left circumflex (LCx): The LCx is small to moderate in caliber. There is a small intermediate and 2 moderate caliber OM branches without significant stenoses.  Right coronary artery (RCA): Large, dominant vessel with moderate diffuse calcification. The proximal vessel has 70% stenosis with segmental disease throughout the mid-vessel. The distal RCA just before it bifurcates into the PDA and PLA branches has 50% stenosis. The PDA and PLA branches are patent.  Left  ventriculography: The anteroapical walls are akinetic. The basal anterior and basal inferior walls contract normally. The LVEF is estimated at 35%.  Final Conclusions:  1. Severe left main disease  2. Severe proximal LAD stenosis  3. Patent LCx  4. Severe RCA stenosis  5. Severe segmental LV dysfunction  6. Normal intracardiac hemodynamics  Recommendations: Admit to hospital because of high-risk coronary anatomy with severe left main disease. Start IV heparin and hold plavix. TCTS consultation for consideration of CABG.    Assessment  and Plan   1. Coronary Artery Disease: Cath showed severe left main, prox LAD, and RCA stenoses w/ EF 35%. No note yet from TCTS, but son states plan is for CABG on Friday 11/03/12. Plavix stopped. Cont IV heparin, BB, ACEI, statin.  2. Ischemic cardiomyopathy s/p ICD: Euvolemic on exam. No arrhythmias on tele. Plan as above.  3. Microcytic anemia: No active signs of bleeding. Will check anemia panel and guaiac stool  Signed, HOPE, JESSICA PA-C  Patient seen, examined. Available data reviewed. Agree with findings, assessment, and plan as outlined by [facility], PA-C. The patient is alert and oriented, in NAD. Lungs are clear. She has had no further CP. Awaiting CABG this Friday per Dr Laneta Simmers. Continue IV heparin until surgery because of severe LM disease.  [name], M.D. 10/31/2012 5:19 PM

## 2012-10-31 NOTE — Consult Note (Signed)
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 [name]  CEAIRA ERNSTER is an 72 y.o. female.  HPI:  The patient is a 72 year old [place] woman who does not speak any English and has a history of congestive heart failure and ischemic cardiomyopathy, status post ICD implantation. She is here with a family member who speaks [place] as well as an Equities trader. She has progressive exercise intolerance and intermittent chest discomfort that is now occurring with walking around her house. She underwent a nuclear adenosine stress test which showed infarction in the distribution of the LAD and possibly the right coronary arteries with modest periinfarct ischemia and severe left ventricular dysfunction with ejection fraction of 31%. Cardiac catheterization showed a heavily calcified left main with a long 95% stenosis. The LAD had 80% proximal stenosis. The right coronary was a large dominant vessel with proximal 70% stenosis and 50% distal stenosis before the bifurcation. Left ventricular ejection fraction was 35% with akinesis of the anterior apical wall. Right heart pressures were normal.  Past Medical History  Diagnosis Date  . PSVT (paroxysmal supraventricular tachycardia)   . Ischemic cardiomyopathy     Anterior MI 2005 with primary PCI  . Automatic implantable cardiac defibrillator in situ   . Chronic systolic heart failure   . 9147 lead 09/06/2011    Past Surgical History  Procedure Date  . Cardiac defibrillator placement     [employer]    History reviewed. No pertinent family history.  Social History:  reports that she has quit smoking. She does not have any smokeless tobacco history on file. She reports that she does not drink alcohol. Her drug history not on file.  Allergies: No  Known Allergies  Medications:  I have reviewed the patient's current medications. Prior to Admission:  Prescriptions prior to admission  Medication Sig Dispense Refill  . carvedilol (COREG) 3.125 MG tablet Take 1 tablet (3.125 mg total) by mouth 2 (two) times daily with a meal.  60 tablet  5  . clopidogrel (PLAVIX) 75 MG tablet Take 1 tablet (75 mg total) by mouth daily.  30 tablet  11  . diclofenac sodium (VOLTAREN) 1 % GEL Apply 1 application topically.        . gabapentin (NEURONTIN) 100 MG capsule Take 100 mg by mouth at bedtime.        [name] Kitchen lisinopril (PRINIVIL,ZESTRIL) 2.5 MG tablet Take 2.5 mg by mouth daily.        [name] Kitchen omeprazole (PRILOSEC) 20 MG capsule Take 20 mg by mouth 2 (two) times daily.        . simvastatin (ZOCOR) 20 MG tablet Take 20 mg by mouth at bedtime.         Scheduled:   . carvedilol  3.125 mg Oral BID WC  . feeding supplement  237 mL Oral TID BM  . gabapentin  100 mg Oral QHS  . influenza  inactive virus vaccine  0.5 mL Intramuscular Tomorrow-1000  . lisinopril  2.5 mg Oral Daily  . pantoprazole  40 mg Oral Daily  . pneumococcal 23 valent vaccine  0.5 mL Intramuscular Tomorrow-1000  . simvastatin  20 mg  Oral QHS  . sodium chloride  3 mL Intravenous Q12H   Continuous:   . sodium chloride 10 mL/hr at 10/31/12 0700  . heparin 850 Units/hr (10/31/12 1500)   AVW:UJWJXB chloride, acetaminophen, ondansetron (ZOFRAN) IV, oxyCODONE-acetaminophen, sodium chloride  Results for orders placed during the hospital encounter of 10/30/12 (from the past 48 hour(s))  SURGICAL PCR SCREEN     Status: Normal   Collection Time   10/30/12  1:22 PM      Component Value Range Comment   MRSA, PCR NEGATIVE  NEGATIVE    Staphylococcus aureus NEGATIVE  NEGATIVE   POCT I-STAT 3, BLOOD GAS (G3+)     Status: Abnormal   Collection Time   10/30/12  2:01 PM      Component Value Range Comment   pH, Arterial 7.369  7.350 - 7.450    pCO2 arterial 41.2  35.0 - 45.0 mmHg    pO2, Arterial  62.0 (*) 80.0 - 100.0 mmHg    Bicarbonate 23.8  20.0 - 24.0 mEq/L    TCO2 25  0 - 100 mmol/L    O2 Saturation 91.0      Acid-base deficit 1.0  0.0 - 2.0 mmol/L    Sample type ARTERIAL     POCT I-STAT 3, BLOOD GAS (G3P V)     Status: Abnormal   Collection Time   10/30/12  2:02 PM      Component Value Range Comment   pH, Ven 7.354 (*) 7.250 - 7.300    pCO2, Ven 44.3 (*) 45.0 - 50.0 mmHg    pO2, Ven 34.0  30.0 - 45.0 mmHg    Bicarbonate 24.7 (*) 20.0 - 24.0 mEq/L    TCO2 26  0 - 100 mmol/L    O2 Saturation 63.0      Acid-base deficit 1.0  0.0 - 2.0 mmol/L    Sample type VENOUS      Comment NOTIFIED PHYSICIAN     MRSA PCR SCREENING     Status: Normal   Collection Time   10/30/12  6:47 PM      Component Value Range Comment   MRSA by PCR NEGATIVE  NEGATIVE   PLATELET INHIBITION P2Y12     Status: Normal   Collection Time   10/30/12  7:45 PM      Component Value Range Comment   Platelet Function  P2Y12 230  194 - 418 PRU   HEPARIN LEVEL (UNFRACTIONATED)     Status: Abnormal   Collection Time   10/31/12  4:45 AM      Component Value Range Comment   Heparin Unfractionated 0.20 (*) 0.30 - 0.70 IU/mL   CBC     Status: Abnormal   Collection Time   10/31/12  4:45 AM      Component Value Range Comment   WBC 4.8  4.0 - 10.5 K/uL    RBC 4.11  3.87 - 5.11 MIL/uL    Hemoglobin 10.4 (*) 12.0 - 15.0 g/dL    HCT 14.7 (*) 82.9 - 46.0 %    MCV 76.9 (*) 78.0 - 100.0 fL    MCH 25.3 (*) 26.0 - 34.0 pg    MCHC 32.9  30.0 - 36.0 g/dL    RDW 56.2  13.0 - 86.5 %    Platelets 212  150 - 400 K/uL   HEPARIN LEVEL (UNFRACTIONATED)     Status: Abnormal   Collection Time   10/31/12 11:18 AM      Component Value Range Comment  Heparin Unfractionated 0.82 (*) 0.30 - 0.70 IU/mL     No results found.  Review of Systems  Constitutional: Positive for malaise/fatigue. Negative for fever, chills and weight loss.  HENT: Negative.   Eyes: Negative.   Respiratory: Positive for shortness of breath.     Cardiovascular: Positive for chest pain.  Gastrointestinal: Negative.   Genitourinary: Negative.   Musculoskeletal: Negative.   Skin: Negative.   Neurological: Negative.   Endo/Heme/Allergies: Negative.   Psychiatric/Behavioral: Negative.    Blood pressure 89/46, pulse 65, temperature 97.7 F (36.5 C), temperature source Oral, resp. rate 15, height 5\' 2"  (1.575 m), weight 119 lb (53.978 kg), SpO2 100.00%. Physical Exam  Constitutional: She is oriented to person, place, and time. She appears well-developed and well-nourished. No distress.  HENT:  Head: Atraumatic.  Eyes: EOM are normal. Pupils are equal, round, and reactive to light.  Neck: Normal range of motion. Neck supple. No JVD present. No thyromegaly present.  Cardiovascular: Normal rate, regular rhythm, normal heart sounds and intact distal pulses.  Exam reveals no gallop and no friction rub.   No murmur heard. Respiratory: Effort normal and breath sounds normal. No respiratory distress.  GI: Soft. Bowel sounds are normal. She exhibits no distension and no mass. There is no tenderness.  Musculoskeletal: She exhibits no edema.  Neurological: She is alert and oriented to person, place, and time. She has normal strength. No cranial nerve deficit or sensory deficit.  Skin: Skin is warm and dry.  Psychiatric: She has a normal mood and affect.   CARDIAC CATH:  Procedural Findings: Hemodynamics RA 2 RV 23/2 PA 25/10 mean 16 PCWP 6 LV 129/10 AO 129/57  Oxygen saturations: PA 63 AO 91  Cardiac Output (Fick) 4.7  Cardiac Index (Fick) 3.0            Coronary angiography: Coronary dominance: right  Left mainstem: Heavily calcified, mid shaft left main 95% stenosis  Left anterior descending (LAD): Small caliber vessel throughout. The proximal vessel has severe stenosis with 80% tandem lesions present. The mid and distal vessels are small and diffusely diseased.  Left circumflex (LCx): The LCx is small to moderate in  caliber. There is a small intermediate and 2 moderate caliber OM branches without significant stenoses.  Right coronary artery (RCA): Large, dominant vessel with moderate diffuse calcification. The proximal vessel has 70% stenosis with segmental disease throughout the mid-vessel. The distal RCA just before it bifurcates into the PDA and PLA branches has 50% stenosis. The PDA and PLA branches are patent.  Left ventriculography: The anteroapical walls are akinetic. The basal anterior and basal inferior walls contract normally. The LVEF is estimated at 35%.  Final Conclusions:   1. Severe left main disease 2. Severe proximal LAD stenosis 3. Patent LCx 4. Severe RCA stenosis 5. Severe segmental LV dysfunction 6. Normal intracardiac hemodynamics  Recommendations: Admit to hospital because of high-risk coronary anatomy with severe left main disease. Start IV heparin and hold plavix. TCTS consultation for consideration of CABG.   [name] 10/30/2012, 2:50 PM          Assessment/Plan:  She has high grade left main coronary stenosis and multiple vessel disease with documented anterior apical infarct and peri-infarct ischemia by stress Myoview examination. Her symptoms have progressed to the point where she can barely do any activity without getting short of breath and chest discomfort. I agree that coronary bypass graft surgery is the best treatment to prevent further ischemia and infarction and improve her symptoms.  Hopefully her left ventricular ejection fraction will improve with revascularization. She was on Plavix prior to admission and this has been discontinued. A P2Y12 level yesterday showed a PRU of 230 indicating that if she was taking her Plavix as directed, she is a nonresponder.  I discussed  the operative procedure with the patient and family including alternatives, benefits and risks; including but not limited to bleeding, blood transfusion, infection, stroke, myocardial  infarction, graft failure, heart block requiring a permanent pacemaker, organ dysfunction, and death.  Bradly Chris understands and agrees to proceed.  We will schedule surgery for Friday.  [name],[name] 10/31/2012, 12:51 PM

## 2012-10-31 NOTE — Progress Notes (Signed)
INITIAL NUTRITION ASSESSMENT  DOCUMENTATION CODES Per approved criteria  -Not Applicable   INTERVENTION: 1. Ensure Complete po TID, each supplement provides 350 kcal and 13 grams of protein. 2. Add snacks TID per pt preferences  3. RD will continue to follow     NUTRITION DIAGNOSIS: Limited food acceptance related to cultural preferences as evidenced by vegetarian diet.   Goal: Meet >/=90% estimated nutrition needs.   Monitor:  Weight trends, po intake  Reason for Assessment: Consult  72 y.o. female  Admitting Dx: Cardiac Cath  ASSESSMENT: S/p cardiac cath on 1/13. RD consulted for assessment of nutrition status, pt is a vegetarian and drinks Ensure or Boost 2-3 times daily. Pt with stable weight, 119-120 lbs, appetite decreased now but was normal PTA.   RD will order Ensure TID and add snacks per pt preference.   Height: Ht Readings from Last 1 Encounters:  10/30/12 5\' 2"  (1.575 m)    Weight: Wt Readings from Last 1 Encounters:  10/30/12 119 lb (53.978 kg)    Ideal Body Weight: 110 lbs   % Ideal Body Weight: 108%  Wt Readings from Last 10 Encounters:  10/30/12 119 lb (53.978 kg)  10/30/12 119 lb (53.978 kg)  10/27/12 119 lb (53.978 kg)  09/08/12 118 lb (53.524 kg)  09/06/11 122 lb (55.339 kg)  09/15/10 129 lb (58.514 kg)  02/03/10 124 lb (56.246 kg)    Usual Body Weight: 119-120 lbs  % Usual Body Weight: 100%  BMI:  Body mass index is 21.77 kg/(m^2). WNL  Estimated Nutritional Needs: Kcal: 1300-1500 Protein: 65-75 gm  Fluid: 1.3-1.5 L/day   Skin: intact   Diet Order: Clear Liquid with order to progress as tolerated   EDUCATION NEEDS: -No education needs identified at this time   Intake/Output Summary (Last 24 hours) at 10/31/12 0900 Last data filed at 10/31/12 0700  Gross per 24 hour  Intake 473.78 ml  Output    450 ml  Net  23.78 ml    Last BM: PTA   Labs:   Lab 10/27/12 1312  NA 138  K 4.3  CL 104  CO2 27  BUN 13    CREATININE 0.8  CALCIUM 9.5  MG --  PHOS --  GLUCOSE 81    CBG (last 3)  No results found for this basename: GLUCAP:3 in the last 72 hours  Scheduled Meds:   . carvedilol  3.125 mg Oral BID WC  . gabapentin  100 mg Oral QHS  . influenza  inactive virus vaccine  0.5 mL Intramuscular Tomorrow-1000  . lisinopril  2.5 mg Oral Daily  . pantoprazole  40 mg Oral Daily  . pneumococcal 23 valent vaccine  0.5 mL Intramuscular Tomorrow-1000  . simvastatin  20 mg Oral QHS  . sodium chloride  3 mL Intravenous Q12H    Continuous Infusions:   . sodium chloride 10 mL/hr at 10/31/12 0700  . heparin 900 Units/hr (10/31/12 0700)    Past Medical History  Diagnosis Date  . PSVT (paroxysmal supraventricular tachycardia)   . Ischemic cardiomyopathy     Anterior MI 2005 with primary PCI  . Automatic implantable cardiac defibrillator in situ   . Chronic systolic heart failure   . 4098 lead 09/06/2011    Past Surgical History  Procedure Date  . Cardiac defibrillator placement     [employer]    Clarene Duke RD, [place] Pager [phone] After Hours pager [phone]

## 2012-11-01 DIAGNOSIS — I5022 Chronic systolic (congestive) heart failure: Secondary | ICD-10-CM

## 2012-11-01 LAB — FOLATE: Folate: 20 ng/mL

## 2012-11-01 LAB — IRON AND TIBC
Iron: 32 ug/dL — ABNORMAL LOW (ref 42–135)
TIBC: 396 ug/dL (ref 250–470)

## 2012-11-01 LAB — RETICULOCYTES: Retic Count, Absolute: 43.4 10*3/uL (ref 19.0–186.0)

## 2012-11-01 LAB — CBC
HCT: 33.8 % — ABNORMAL LOW (ref 36.0–46.0)
Hemoglobin: 10.8 g/dL — ABNORMAL LOW (ref 12.0–15.0)
RDW: 15.4 % (ref 11.5–15.5)
WBC: 5.2 10*3/uL (ref 4.0–10.5)

## 2012-11-01 LAB — BASIC METABOLIC PANEL
BUN: 12 mg/dL (ref 6–23)
Chloride: 103 mEq/L (ref 96–112)
GFR calc Af Amer: 84 mL/min — ABNORMAL LOW (ref >90)
GFR calc non Af Amer: 72 mL/min — ABNORMAL LOW (ref >90)
Potassium: 4 mEq/L (ref 3.5–5.1)
Sodium: 138 mEq/L (ref 135–145)

## 2012-11-01 LAB — HEPARIN LEVEL (UNFRACTIONATED)
Heparin Unfractionated: 0.46 IU/mL (ref 0.30–0.70)
Heparin Unfractionated: 0.4 IU/mL (ref 0.30–0.70)

## 2012-11-01 LAB — FERRITIN: Ferritin: 12 ng/mL (ref 10–291)

## 2012-11-01 NOTE — Progress Notes (Signed)
ANTICOAGULATION CONSULT NOTE - Follow Up Consult  Pharmacy Consult for heparin Indication: chest pain/ACS  No Known Allergies  Patient Measurements: Height: 5\' 2"  (157.5 cm) Weight: 118 lb 6.2 oz (53.7 kg) IBW/kg (Calculated) : 50.1   Vital Signs: Temp: 97.9 F (36.6 C) (01/15 1222) Temp src: Oral (01/15 1222) BP: 88/44 mmHg (01/15 1222) Pulse Rate: 59  (01/15 1222)  Labs:  Basename 11/01/12 1255 11/01/12 0535 10/31/12 2044 10/31/12 0445  HGB -- 10.8* -- 10.4*  HCT -- 33.8* -- 31.6*  PLT -- 229 -- 212  APTT -- -- -- --  LABPROT -- -- -- --  INR -- -- -- --  HEPARINUNFRC 0.40 0.46 1.05* --  CREATININE -- 0.81 -- --  CKTOTAL -- -- -- --  CKMB -- -- -- --  TROPONINI -- -- -- --    Estimated Creatinine Clearance: 51.8 ml/min (by C-G formula based on Cr of 0.81).  Assessment: 72 yo with CP who is s/p cath. Found to have multivessel dz. Pt on IV heparin while awaitng CABG -planned for Friday. Heparin level remains at goal on recheck (HL=0.40) on 850 units/hr  Goal of Therapy:  Heparin level 0.3-0.7 units/ml Monitor platelets by anticoagulation protocol: Yes   Plan:  -No heparin changes needed -Heparin level and CBC daily  Harland German, Pharm D 11/01/2012 2:36 PM

## 2012-11-01 NOTE — Progress Notes (Signed)
SUBJECTIVE: No chest pain or SOB. No events.   BP 91/71  Pulse 74  Temp 97.6 F (36.4 C) (Oral)  Resp 15  Ht 5\' 2"  (1.575 m)  Wt 118 lb 6.2 oz (53.7 kg)  BMI 21.65 kg/m2  SpO2 99%  Intake/Output Summary (Last 24 hours) at 11/01/12 1007 Last data filed at 11/01/12 0800  Gross per 24 hour  Intake 649.12 ml  Output   1000 ml  Net -350.88 ml    PHYSICAL EXAM General: Well developed, well nourished, in no acute distress. Alert and oriented x 3.  Psych:  Good affect, responds appropriately Neck: No JVD. No masses noted.  Lungs: Clear bilaterally with no wheezes or rhonci noted.  Heart: RRR with no murmurs noted. Abdomen: Bowel sounds are present. Soft, non-tender.  Extremities: No lower extremity edema.   LABS: Basic Metabolic Panel:  Basename 11/01/12 0535  NA 138  K 4.0  CL 103  CO2 24  GLUCOSE 103*  BUN 12  CREATININE 0.81  CALCIUM 9.3  MG --  PHOS --   CBC:  Basename 11/01/12 0535 10/31/12 0445  WBC 5.2 4.8  NEUTROABS -- --  HGB 10.8* 10.4*  HCT 33.8* 31.6*  MCV 77.9* 76.9*  PLT 229 212   Current Meds:    . carvedilol  3.125 mg Oral BID WC  . docusate sodium  100 mg Oral BID  . feeding supplement  237 mL Oral TID BM  . gabapentin  100 mg Oral QHS  . influenza  inactive virus vaccine  0.5 mL Intramuscular Tomorrow-1000  . lisinopril  2.5 mg Oral Daily  . pantoprazole  40 mg Oral Daily  . pneumococcal 23 valent vaccine  0.5 mL Intramuscular Tomorrow-1000  . simvastatin  20 mg Oral QHS  . sodium chloride  3 mL Intravenous Q12H   10/30/12 - R/L Cardiac Cath  Hemodynamics  RA 2  RV 23/2  PA 25/10 mean 16  PCWP 6  LV 129/10  AO 129/57  Oxygen saturations:  PA 63  AO 91  Cardiac Output (Fick) 4.7  Cardiac Index (Fick) 3.0  Coronary angiography:  Coronary dominance: right  Left mainstem: Heavily calcified, mid shaft left main 95% stenosis  Left anterior descending (LAD): Small caliber vessel throughout. The proximal vessel has severe  stenosis with 80% tandem lesions present. The mid and distal vessels are small and diffusely diseased.  Left circumflex (LCx): The LCx is small to moderate in caliber. There is a small intermediate and 2 moderate caliber OM branches without significant stenoses.  Right coronary artery (RCA): Large, dominant vessel with moderate diffuse calcification. The proximal vessel has 70% stenosis with segmental disease throughout the mid-vessel. The distal RCA just before it bifurcates into the PDA and PLA branches has 50% stenosis. The PDA and PLA branches are patent.  Left ventriculography: The anteroapical walls are akinetic. The basal anterior and basal inferior walls contract normally. The LVEF is estimated at 35%.  Final Conclusions:  1. Severe left main disease  2. Severe proximal LAD stenosis  3. Patent LCx  4. Severe RCA stenosis  5. Severe segmental LV dysfunction  6. Normal intracardiac hemodynamics  Recommendations: Admit to hospital because of high-risk coronary anatomy with severe left main disease. Start IV heparin and hold plavix. TCTS consultation for consideration of CABG.   Assessment and Plan:   1. Coronary Artery Disease: Cath showed severe left main, prox LAD, and RCA stenoses w/ EF 35%. Plans for CABG on Friday 11/03/12.  Plavix has been stopped. Cont IV heparin, BB, ACEI, statin.   2. Ischemic cardiomyopathy s/p ICD: Euvolemic on exam. No arrhythmias on tele. Plan as above.   3. Microcytic anemia: No active signs of bleeding. Stable.   [name]  1/15/201410:07 AM

## 2012-11-01 NOTE — Care Management Note (Addendum)
    Page 1 of 2   11/10/2012     1:33:31 PM   CARE MANAGEMENT NOTE 11/10/2012  Patient:  [name], [name]   Account Number:  [id number]  Date Initiated:  11/01/2012  Documentation initiated by:  Junius Creamer  Subjective/Objective Assessment:   adm w angina, cvts eval     Action/Plan:   lives w wife, pcp dr vyas   Anticipated DC Date:  11/10/2012   Anticipated DC Plan:  HOME W HOME HEALTH SERVICES      DC Planning Services  CM consult      [facility] Choice  HOME HEALTH   Choice offered to / List presented to:  C-1 Patient   DME arranged  [facility]  [facility]      DME agency  [facility]     HH arranged  HH-1 RN  HH-2 PT  HH-4 NURSE'S AIDE      HH agency  [facility]   Status of service:  Completed, signed off Medicare Important Message given?   (If response is "NO", the following Medicare IM given date fields will be blank) Date Medicare IM given:   Date Additional Medicare IM given:    Discharge Disposition:  HOME W HOME HEALTH SERVICES  Per UR Regulation:  Reviewed for med. necessity/level of care/duration of stay  If discussed at Long Length of Stay Meetings, dates discussed:   11/07/2012    Comments:  11/10/12 Jennifer Fanning Anaisa Radi,RN,BSN [phone] NOTIFIED [facility] OF DISCHARGE HOME TODAY.  DME DELIVERED TO ROOM PRIOR TO DC.  11/09/12 Jennifer Skilton,RN,BSN [phone] PT S/P CABG X 4 ON 1/17.  PTA, PT INDEPENDENT, LIVES WITH SON AND HUSBAND.  DAUGHTER VISITING FOR 2 WEEKS FROM [place]. FAMILY INTERESTED IN [facility] FOLLOW UP AT DISCHARGE, AND ARE REQUESTING DME FOR HOME.  WILL ARRANGE HH SERVICES AND OBTAIN DME AS REQUESTED.  START OF CARE 24-48H POST DC DATE.  FAMILY INTERESTED IN SOMEONE TO DO HOUSEWORK AT DC, AS THEY STATE THERE IS NOONE AVAILABLE TO DO THIS AT HOME. OFFERED PRIVATE DUTY LIST, AND EXPLAINED THAT THIS IS NOT COVERED BY MEDICARE.  FAMILY DECLINED LIST, ONLY WANT SERVICES THAT ARE COVERED.  11/06/12 1120 Jennifer Mayo RN BSN MSN  CCM Pt states she plans to d/c home with family.  Per staff, pt doing well with ambulation.  To x-fer to tele.  1/15 0953 Jennifer dowell rn,bsn [phone]

## 2012-11-01 NOTE — Progress Notes (Signed)
ANTICOAGULATION CONSULT NOTE - Follow Up Consult  Pharmacy Consult for heparin Indication: chest pain/ACS  No Known Allergies  Patient Measurements: Height: 5\' 2"  (157.5 cm) Weight: 118 lb 6.2 oz (53.7 kg) IBW/kg (Calculated) : 50.1   Vital Signs: Temp: 97.6 F (36.4 C) (01/15 0754) Temp src: Oral (01/15 0754) BP: 91/71 mmHg (01/15 0754) Pulse Rate: 74  (01/15 0754)  Labs:  Basename 11/01/12 0535 10/31/12 2044 10/31/12 1118 10/31/12 0445  HGB 10.8* -- -- 10.4*  HCT 33.8* -- -- 31.6*  PLT 229 -- -- 212  APTT -- -- -- --  LABPROT -- -- -- --  INR -- -- -- --  HEPARINUNFRC 0.46 1.05* 0.82* --  CREATININE 0.81 -- -- --  CKTOTAL -- -- -- --  CKMB -- -- -- --  TROPONINI -- -- -- --    Estimated Creatinine Clearance: 51.8 ml/min (by C-G formula based on Cr of 0.81).  Assessment: 73 yo with CP who is s/p cath. Found to have multivessel dz. Pt on IV heparin while awaitng CABG -planned for Friday. Heparin level is 0.46 after heparin held for 1hr and decrease to 850 units/hr  Goal of Therapy:  Heparin level 0.3-0.7 units/ml Monitor platelets by anticoagulation protocol: Yes   Plan:  -No heparin changes needed -Will recheck a heparin level later today  Harland German, Pharm D 11/01/2012 9:31 AM

## 2012-11-02 ENCOUNTER — Inpatient Hospital Stay (HOSPITAL_COMMUNITY): Payer: Medicare Other

## 2012-11-02 DIAGNOSIS — I2510 Atherosclerotic heart disease of native coronary artery without angina pectoris: Secondary | ICD-10-CM

## 2012-11-02 LAB — CBC
Hemoglobin: 10 g/dL — ABNORMAL LOW (ref 12.0–15.0)
MCH: 25.7 pg — ABNORMAL LOW (ref 26.0–34.0)
Platelets: 221 10*3/uL (ref 150–400)
RBC: 3.89 MIL/uL (ref 3.87–5.11)
WBC: 5.6 10*3/uL (ref 4.0–10.5)

## 2012-11-02 LAB — BASIC METABOLIC PANEL
CO2: 27 mEq/L (ref 19–32)
Calcium: 9.2 mg/dL (ref 8.4–10.5)
Chloride: 105 mEq/L (ref 96–112)
Glucose, Bld: 106 mg/dL — ABNORMAL HIGH (ref 70–99)
Potassium: 4.3 mEq/L (ref 3.5–5.1)
Sodium: 141 mEq/L (ref 135–145)

## 2012-11-02 LAB — URINALYSIS, ROUTINE W REFLEX MICROSCOPIC
Bilirubin Urine: NEGATIVE
Glucose, UA: NEGATIVE mg/dL
Hgb urine dipstick: NEGATIVE
Ketones, ur: NEGATIVE mg/dL
Protein, ur: NEGATIVE mg/dL
pH: 7.5 (ref 5.0–8.0)

## 2012-11-02 LAB — SURGICAL PCR SCREEN
MRSA, PCR: NEGATIVE
Staphylococcus aureus: NEGATIVE

## 2012-11-02 LAB — PREPARE RBC (CROSSMATCH)

## 2012-11-02 LAB — HEPARIN LEVEL (UNFRACTIONATED): Heparin Unfractionated: 0.44 IU/mL (ref 0.30–0.70)

## 2012-11-02 MED ORDER — POLYETHYLENE GLYCOL 3350 17 G PO PACK
17.0000 g | PACK | Freq: Two times a day (BID) | ORAL | Status: DC
  Administered 2012-11-02: 17 g via ORAL
  Filled 2012-11-02 (×4): qty 1

## 2012-11-02 MED ORDER — DEXTROSE 5 % IV SOLN
1.5000 g | INTRAVENOUS | Status: AC
  Administered 2012-11-03: .75 g via INTRAVENOUS
  Administered 2012-11-03: 1.5 g via INTRAVENOUS
  Filled 2012-11-02 (×2): qty 1.5

## 2012-11-02 MED ORDER — NITROGLYCERIN IN D5W 200-5 MCG/ML-% IV SOLN
2.0000 ug/min | INTRAVENOUS | Status: AC
  Administered 2012-11-03: 5 ug/min via INTRAVENOUS
  Filled 2012-11-02: qty 250

## 2012-11-02 MED ORDER — DEXTROSE 5 % IV SOLN
750.0000 mg | INTRAVENOUS | Status: DC
  Filled 2012-11-02 (×3): qty 750

## 2012-11-02 MED ORDER — CHLORHEXIDINE GLUCONATE 4 % EX LIQD
60.0000 mL | Freq: Once | CUTANEOUS | Status: AC
  Administered 2012-11-03: 4 via TOPICAL
  Filled 2012-11-02: qty 60

## 2012-11-02 MED ORDER — EPINEPHRINE HCL 1 MG/ML IJ SOLN
0.5000 ug/min | INTRAVENOUS | Status: DC
  Filled 2012-11-02: qty 4

## 2012-11-02 MED ORDER — METOPROLOL TARTRATE 12.5 MG HALF TABLET
12.5000 mg | ORAL_TABLET | Freq: Once | ORAL | Status: AC
  Administered 2012-11-03: 12.5 mg via ORAL
  Filled 2012-11-02: qty 1

## 2012-11-02 MED ORDER — SODIUM CHLORIDE 0.9 % IV SOLN
INTRAVENOUS | Status: AC
  Administered 2012-11-03: 1 [IU]/h via INTRAVENOUS
  Filled 2012-11-02 (×2): qty 1

## 2012-11-02 MED ORDER — DEXMEDETOMIDINE HCL IN NACL 400 MCG/100ML IV SOLN
0.1000 ug/kg/h | INTRAVENOUS | Status: AC
  Administered 2012-11-03: 0.2 ug/kg/h via INTRAVENOUS
  Filled 2012-11-02: qty 100

## 2012-11-02 MED ORDER — BISACODYL 5 MG PO TBEC: 5.0000 mg | DELAYED_RELEASE_TABLET | Freq: Once | ORAL | Status: DC

## 2012-11-02 MED ORDER — VANCOMYCIN HCL 10 G IV SOLR
1250.0000 mg | INTRAVENOUS | Status: AC
  Administered 2012-11-03: 1250 mg via INTRAVENOUS
  Filled 2012-11-02 (×2): qty 1250

## 2012-11-02 MED ORDER — POTASSIUM CHLORIDE 2 MEQ/ML IV SOLN
80.0000 meq | INTRAVENOUS | Status: DC
Start: 2012-11-03 — End: 2012-11-03
  Filled 2012-11-02: qty 40

## 2012-11-02 MED ORDER — SODIUM CHLORIDE 0.9 % IV SOLN
INTRAVENOUS | Status: AC
  Administered 2012-11-03: 69.8 mL/h via INTRAVENOUS
  Filled 2012-11-02 (×2): qty 40

## 2012-11-02 MED ORDER — DOPAMINE-DEXTROSE 3.2-5 MG/ML-% IV SOLN
2.0000 ug/kg/min | INTRAVENOUS | Status: AC
  Administered 2012-11-03: 3.5 ug/kg/min via INTRAVENOUS
  Filled 2012-11-02: qty 250

## 2012-11-02 MED ORDER — MAGNESIUM SULFATE 50 % IJ SOLN
40.0000 meq | INTRAMUSCULAR | Status: DC
  Filled 2012-11-02: qty 10

## 2012-11-02 MED ORDER — DEXTROSE 5 % IV SOLN
30.0000 ug/min | INTRAVENOUS | Status: DC
  Filled 2012-11-02 (×2): qty 2

## 2012-11-02 MED ORDER — VERAPAMIL HCL 2.5 MG/ML IV SOLN
INTRAVENOUS | Status: AC
  Administered 2012-11-03: 07:00:00
  Filled 2012-11-02 (×2): qty 2.5

## 2012-11-02 MED ORDER — TEMAZEPAM 15 MG PO CAPS: 15.0000 mg | ORAL_CAPSULE | Freq: Once | ORAL | Status: AC | PRN

## 2012-11-02 MED ORDER — DIAZEPAM 5 MG PO TABS
5.0000 mg | ORAL_TABLET | Freq: Once | ORAL | Status: AC
  Administered 2012-11-03: 5 mg via ORAL
  Filled 2012-11-02: qty 1

## 2012-11-02 NOTE — Progress Notes (Signed)
Spoke with Arlys John from cath lab. ICD is Medtronic device. He called the rep, Leta Jungling who will come to the OR holding area by 0700 in the AM to turn off device.

## 2012-11-02 NOTE — Progress Notes (Signed)
    SUBJECTIVE: No chest pain or SOB.   BP 95/52  Pulse 60  Temp 98.4 F (36.9 C) (Oral)  Resp 18  Ht 5\' 2"  (1.575 m)  Wt 118 lb 6.2 oz (53.7 kg)  BMI 21.65 kg/m2  SpO2 94%  Intake/Output Summary (Last 24 hours) at 11/02/12 0719 Last data filed at 11/02/12 0600  Gross per 24 hour  Intake   1480 ml  Output      0 ml  Net   1480 ml    PHYSICAL EXAM General: Well developed, well nourished, in no acute distress. Alert and oriented x 3.  Psych:  Good affect, responds appropriately Neck: No JVD. No masses noted.  Lungs: Clear bilaterally with no wheezes or rhonci noted.  Heart: RRR with no murmurs noted. Abdomen: Bowel sounds are present. Soft, non-tender.  Extremities: No lower extremity edema.   LABS: Basic Metabolic Panel:  Basename 11/02/12 0452 11/01/12 0535  NA 141 138  K 4.3 4.0  CL 105 103  CO2 27 24  GLUCOSE 106* 103*  BUN 13 12  CREATININE 0.85 0.81  CALCIUM 9.2 9.3  MG -- --  PHOS -- --   CBC:  Basename 11/02/12 0452 11/01/12 0535  WBC 5.6 5.2  NEUTROABS -- --  HGB 10.0* 10.8*  HCT 30.3* 33.8*  MCV 77.9* 77.9*  PLT 221 229    Current Meds:    . carvedilol  3.125 mg Oral BID WC  . docusate sodium  100 mg Oral BID  . feeding supplement  237 mL Oral TID BM  . gabapentin  100 mg Oral QHS  . influenza  inactive virus vaccine  0.5 mL Intramuscular Tomorrow-1000  . lisinopril  2.5 mg Oral Daily  . pantoprazole  40 mg Oral Daily  . pneumococcal 23 valent vaccine  0.5 mL Intramuscular Tomorrow-1000  . simvastatin  20 mg Oral QHS  . sodium chloride  3 mL Intravenous Q12H     ASSESSMENT AND PLAN:   1. Coronary Artery Disease: Cath showed severe left main, prox LAD, and RCA stenoses w/ EF 35%. Plans for CABG on Friday 11/03/12. Plavix has been stopped. Cont IV heparin, BB, ACEI, statin.   2. Ischemic cardiomyopathy s/p ICD: Euvolemic on exam. No arrhythmias on tele. Plan as above.   3. Microcytic anemia: No active signs of bleeding. Stable.    4. Constipation: Will continue stool softener. Will add Miralax.     Shoshannah Faubert  1/16/20147:19 AM

## 2012-11-02 NOTE — Progress Notes (Signed)
3 Days Post-Op Procedure(s) (LRB): LEFT AND RIGHT HEART CATHETERIZATION WITH CORONARY ANGIOGRAM (N/A) Subjective: No chest pain or SOB  Objective: Vital signs in last 24 hours: Temp:  [97.9 F (36.6 C)-98.6 F (37 C)] 97.9 F (36.6 C) (01/16 1218) Pulse Rate:  [60-72] 71  (01/16 1218) Cardiac Rhythm:  [-] Normal sinus rhythm (01/16 0830) Resp:  [15-20] 20  (01/16 0807) BP: (87-112)/(42-55) 95/45 mmHg (01/16 1218) SpO2:  [94 %-100 %] 100 % (01/16 1218) Weight:  [53.7 kg (118 lb 6.2 oz)] 53.7 kg (118 lb 6.2 oz) (01/16 0500)  Hemodynamic parameters for last 24 hours:    Intake/Output from previous day: 01/15 0701 - 01/16 0700 In: 1480 [P.O.:1117; I.V.:363] Out: -  Intake/Output this shift: Total I/O In: 800 [P.O.:560; Other:240] Out: -   General appearance: alert and cooperative Heart: regular rate and rhythm, S1, S2 normal, no murmur, click, rub or gallop Lungs: clear to auscultation bilaterally  Lab Results:  Basename 11/02/12 0452 11/01/12 0535  WBC 5.6 5.2  HGB 10.0* 10.8*  HCT 30.3* 33.8*  PLT 221 229   BMET:  Basename 11/02/12 0452 11/01/12 0535  NA 141 138  Mathews 4.3 4.0  CL 105 103  CO2 27 24  GLUCOSE 106* 103*  BUN 13 12  CREATININE 0.85 0.81  CALCIUM 9.2 9.3    PT/INR: No results found for this basename: LABPROT,INR in the last 72 hours ABG    Component Value Date/Time   PHART 7.369 10/30/2012 1401   HCO3 24.7* 10/30/2012 1402   TCO2 26 10/30/2012 1402   ACIDBASEDEF 1.0 10/30/2012 1402   O2SAT 63.0 10/30/2012 1402   CBG (last 3)  No results found for this basename: GLUCAP:3 in the last 72 hours  Assessment/Plan: S/P Procedure(s) (LRB): LEFT AND RIGHT HEART CATHETERIZATION WITH CORONARY ANGIOGRAM (N/A) 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 Plan CABG in am. I discussed the operative procedure with the patient and son who speaks English including alternatives, benefits and risks; including but not limited to bleeding, blood transfusion, infection,  stroke, myocardial infarction, graft failure, heart block requiring a permanent pacemaker, organ dysfunction, and death.  [name] understands and agrees to proceed.  We will schedule surgery for am.   LOS: 3 days    [name] 11/02/2012

## 2012-11-03 ENCOUNTER — Encounter (HOSPITAL_COMMUNITY): Payer: Self-pay | Admitting: Anesthesiology

## 2012-11-03 ENCOUNTER — Encounter (HOSPITAL_COMMUNITY)
Admission: RE | Disposition: A | Discharge status: Home Health | Payer: Self-pay | Source: Ambulatory Visit | Attending: Surgery

## 2012-11-03 ENCOUNTER — Inpatient Hospital Stay (HOSPITAL_COMMUNITY): Payer: Medicare Other | Admitting: Anesthesiology

## 2012-11-03 ENCOUNTER — Inpatient Hospital Stay (HOSPITAL_COMMUNITY): Payer: Medicare Other

## 2012-11-03 DIAGNOSIS — I2510 Atherosclerotic heart disease of native coronary artery without angina pectoris: Secondary | ICD-10-CM

## 2012-11-03 HISTORY — PX: INTRAOPERATIVE TRANSESOPHAGEAL ECHOCARDIOGRAM: SHX5062

## 2012-11-03 HISTORY — PX: CORONARY ARTERY BYPASS GRAFT: SHX141

## 2012-11-03 LAB — POCT I-STAT 4, (NA,K, GLUC, HGB,HCT)
Glucose, Bld: 107 mg/dL — ABNORMAL HIGH (ref 70–99)
Glucose, Bld: 125 mg/dL — ABNORMAL HIGH (ref 70–99)
Glucose, Bld: 222 mg/dL — ABNORMAL HIGH (ref 70–99)
Glucose, Bld: 144 mg/dL — ABNORMAL HIGH (ref 70–99)
HCT: 29 % — ABNORMAL LOW (ref 36.0–46.0)
HCT: 24 % — ABNORMAL LOW (ref 36.0–46.0)
HCT: 27 % — ABNORMAL LOW (ref 36.0–46.0)
Hemoglobin: 8.5 g/dL — ABNORMAL LOW (ref 12.0–15.0)
Hemoglobin: 8.8 g/dL — ABNORMAL LOW (ref 12.0–15.0)
Hemoglobin: 12.2 g/dL (ref 12.0–15.0)
Potassium: 4.5 mEq/L (ref 3.5–5.1)
Potassium: 4.4 mEq/L (ref 3.5–5.1)
Potassium: 4.8 mEq/L (ref 3.5–5.1)
Potassium: 3.8 mEq/L (ref 3.5–5.1)
Sodium: 137 mEq/L (ref 135–145)
Sodium: 132 mEq/L — ABNORMAL LOW (ref 135–145)

## 2012-11-03 LAB — CREATININE, SERUM: GFR calc Af Amer: 83 mL/min — ABNORMAL LOW (ref >90)

## 2012-11-03 LAB — CBC
HCT: 34.9 % — ABNORMAL LOW (ref 36.0–46.0)
HCT: 35.3 % — ABNORMAL LOW (ref 36.0–46.0)
Hemoglobin: 11 g/dL — ABNORMAL LOW (ref 12.0–15.0)
Hemoglobin: 12 g/dL (ref 12.0–15.0)
MCH: 24.8 pg — ABNORMAL LOW (ref 26.0–34.0)
MCH: 26.8 pg (ref 26.0–34.0)
MCHC: 31.5 g/dL (ref 30.0–36.0)
MCHC: 34 g/dL (ref 30.0–36.0)
MCV: 78.8 fL (ref 78.0–100.0)
MCV: 79.6 fL (ref 78.0–100.0)
Platelets: 97 10*3/uL — ABNORMAL LOW (ref 150–400)
RBC: 4.43 MIL/uL (ref 3.87–5.11)
RBC: 4.32 MIL/uL (ref 3.87–5.11)
RBC: 4.47 MIL/uL (ref 3.87–5.11)
RDW: 15.5 % (ref 11.5–15.5)
WBC: 7 10*3/uL (ref 4.0–10.5)

## 2012-11-03 LAB — POCT I-STAT, CHEM 8
BUN: 12 mg/dL (ref 6–23)
Chloride: 105 mEq/L (ref 96–112)
Potassium: 4.9 mEq/L (ref 3.5–5.1)
Sodium: 138 mEq/L (ref 135–145)

## 2012-11-03 LAB — POCT I-STAT 3, ART BLOOD GAS (G3+)
Acid-base deficit: 1 mmol/L (ref 0.0–2.0)
Acid-base deficit: 1 mmol/L (ref 0.0–2.0)
Bicarbonate: 24.4 mEq/L — ABNORMAL HIGH (ref 20.0–24.0)
Bicarbonate: 25.5 mEq/L — ABNORMAL HIGH (ref 20.0–24.0)
Bicarbonate: 24.9 mEq/L — ABNORMAL HIGH (ref 20.0–24.0)
O2 Saturation: 100 %
O2 Saturation: 100 %
O2 Saturation: 100 %
O2 Saturation: 94 %
TCO2: 25 mmol/L (ref 0–100)
TCO2: 26 mmol/L (ref 0–100)
TCO2: 26 mmol/L (ref 0–100)
pCO2 arterial: 46.8 mmHg — ABNORMAL HIGH (ref 35.0–45.0)
pCO2 arterial: 45.3 mmHg — ABNORMAL HIGH (ref 35.0–45.0)
pCO2 arterial: 40 mmHg (ref 35.0–45.0)
pCO2 arterial: 39.7 mmHg (ref 35.0–45.0)
pCO2 arterial: 46.4 mmHg — ABNORMAL HIGH (ref 35.0–45.0)
pCO2 arterial: 42 mmHg (ref 35.0–45.0)
pH, Arterial: 7.387 (ref 7.350–7.450)
pH, Arterial: 7.325 — ABNORMAL LOW (ref 7.350–7.450)
pH, Arterial: 7.384 (ref 7.350–7.450)
pO2, Arterial: 355 mmHg — ABNORMAL HIGH (ref 80.0–100.0)
pO2, Arterial: 66 mmHg — ABNORMAL LOW (ref 80.0–100.0)
pO2, Arterial: 92 mmHg (ref 80.0–100.0)
pO2, Arterial: 105 mmHg — ABNORMAL HIGH (ref 80.0–100.0)

## 2012-11-03 LAB — HEMOGLOBIN AND HEMATOCRIT, BLOOD
HCT: 26.8 % — ABNORMAL LOW (ref 36.0–46.0)
Hemoglobin: 9 g/dL — ABNORMAL LOW (ref 12.0–15.0)

## 2012-11-03 LAB — PROTIME-INR
INR: 1.32 (ref 0.00–1.49)
Prothrombin Time: 16.1 seconds — ABNORMAL HIGH (ref 11.6–15.2)

## 2012-11-03 LAB — BASIC METABOLIC PANEL
BUN: 14 mg/dL (ref 6–23)
CO2: 28 mEq/L (ref 19–32)
Calcium: 9.7 mg/dL (ref 8.4–10.5)
GFR calc non Af Amer: 68 mL/min — ABNORMAL LOW (ref >90)
Glucose, Bld: 118 mg/dL — ABNORMAL HIGH (ref 70–99)

## 2012-11-03 LAB — MAGNESIUM: Magnesium: 4.1 mg/dL — ABNORMAL HIGH (ref 1.5–2.5)

## 2012-11-03 LAB — APTT: aPTT: 36 seconds (ref 24–37)

## 2012-11-03 LAB — HEMOGLOBIN A1C
Hgb A1c MFr Bld: 6.2 % — ABNORMAL HIGH (ref <5.7)
Mean Plasma Glucose: 131 mg/dL — ABNORMAL HIGH (ref <117)

## 2012-11-03 SURGERY — CORONARY ARTERY BYPASS GRAFTING (CABG)
Anesthesia: General | Site: Chest | Wound class: Clean

## 2012-11-03 MED ORDER — SODIUM CHLORIDE 0.9 % IV SOLN
INTRAVENOUS | Status: DC
  Administered 2012-11-03: 20 mL/h via INTRAVENOUS

## 2012-11-03 MED ORDER — SODIUM CHLORIDE 0.9 % IJ SOLN: 3.0000 mL | INTRAMUSCULAR | Status: DC | PRN

## 2012-11-03 MED ORDER — PROTAMINE SULFATE 10 MG/ML IV SOLN
INTRAVENOUS | Status: DC | PRN
  Administered 2012-11-03: 140 mg via INTRAVENOUS

## 2012-11-03 MED ORDER — DOCUSATE SODIUM 100 MG PO CAPS
200.0000 mg | ORAL_CAPSULE | Freq: Every day | ORAL | Status: DC
  Administered 2012-11-04 – 2012-11-10 (×5): 200 mg via ORAL
  Filled 2012-11-03 (×5): qty 2

## 2012-11-03 MED ORDER — ACETAMINOPHEN 10 MG/ML IV SOLN
1000.0000 mg | Freq: Once | INTRAVENOUS | Status: AC
  Administered 2012-11-03: 1000 mg via INTRAVENOUS
  Filled 2012-11-03: qty 100

## 2012-11-03 MED ORDER — VANCOMYCIN HCL IN DEXTROSE 1-5 GM/200ML-% IV SOLN
1000.0000 mg | Freq: Once | INTRAVENOUS | Status: AC
  Administered 2012-11-03: 1000 mg via INTRAVENOUS
  Filled 2012-11-03: qty 200

## 2012-11-03 MED ORDER — METOCLOPRAMIDE HCL 5 MG/ML IJ SOLN
10.0000 mg | Freq: Four times a day (QID) | INTRAMUSCULAR | Status: AC
  Administered 2012-11-03 – 2012-11-04 (×3): 10 mg via INTRAVENOUS
  Filled 2012-11-03 (×3): qty 2

## 2012-11-03 MED ORDER — PROPOFOL 10 MG/ML IV BOLUS
INTRAVENOUS | Status: DC | PRN
  Administered 2012-11-03: 50 mg via INTRAVENOUS

## 2012-11-03 MED ORDER — LACTATED RINGERS IV SOLN
INTRAVENOUS | Status: DC | PRN
  Administered 2012-11-03: 07:00:00 via INTRAVENOUS

## 2012-11-03 MED ORDER — MORPHINE SULFATE 2 MG/ML IJ SOLN
1.0000 mg | INTRAMUSCULAR | Status: AC | PRN
  Filled 2012-11-03: qty 1

## 2012-11-03 MED ORDER — MIDAZOLAM HCL 5 MG/5ML IJ SOLN
INTRAMUSCULAR | Status: DC | PRN
  Administered 2012-11-03: 2 mg via INTRAVENOUS
  Administered 2012-11-03 (×3): 1 mg via INTRAVENOUS

## 2012-11-03 MED ORDER — LACTATED RINGERS IV SOLN
INTRAVENOUS | Status: DC
  Administered 2012-11-03: 20 mL/h via INTRAVENOUS

## 2012-11-03 MED ORDER — MORPHINE SULFATE 2 MG/ML IJ SOLN
2.0000 mg | INTRAMUSCULAR | Status: DC | PRN
  Administered 2012-11-04 (×2): 2 mg via INTRAVENOUS
  Filled 2012-11-03 (×4): qty 1

## 2012-11-03 MED ORDER — INSULIN REGULAR BOLUS VIA INFUSION
0.0000 [IU] | Freq: Three times a day (TID) | INTRAVENOUS | Status: DC
  Filled 2012-11-03: qty 10

## 2012-11-03 MED ORDER — PHENYLEPHRINE HCL 10 MG/ML IJ SOLN
0.0000 ug/min | INTRAVENOUS | Status: DC
  Administered 2012-11-03: 1 ug/min via INTRAVENOUS
  Filled 2012-11-03: qty 2

## 2012-11-03 MED ORDER — OXYCODONE HCL 5 MG PO TABS
5.0000 mg | ORAL_TABLET | ORAL | Status: DC | PRN
  Administered 2012-11-04 – 2012-11-05 (×4): 10 mg via ORAL
  Administered 2012-11-08: 5 mg via ORAL
  Filled 2012-11-03: qty 2
  Filled 2012-11-03: qty 1
  Filled 2012-11-03 (×3): qty 2

## 2012-11-03 MED ORDER — MIDAZOLAM HCL 2 MG/2ML IJ SOLN
INTRAMUSCULAR | Status: AC
  Filled 2012-11-03: qty 2

## 2012-11-03 MED ORDER — PANTOPRAZOLE SODIUM 40 MG PO TBEC
40.0000 mg | DELAYED_RELEASE_TABLET | Freq: Every day | ORAL | Status: DC
  Administered 2012-11-04 – 2012-11-10 (×7): 40 mg via ORAL
  Filled 2012-11-03 (×7): qty 1

## 2012-11-03 MED ORDER — DEXMEDETOMIDINE HCL IN NACL 200 MCG/50ML IV SOLN: 0.1000 ug/kg/h | INTRAVENOUS | Status: DC

## 2012-11-03 MED ORDER — SODIUM CHLORIDE 0.9 % IV SOLN: 250.0000 mL | INTRAVENOUS | Status: DC | PRN

## 2012-11-03 MED ORDER — ONDANSETRON HCL 4 MG/2ML IJ SOLN
4.0000 mg | Freq: Four times a day (QID) | INTRAMUSCULAR | Status: DC | PRN
  Administered 2012-11-04: 4 mg via INTRAVENOUS
  Filled 2012-11-03: qty 2

## 2012-11-03 MED ORDER — METOPROLOL TARTRATE 12.5 MG HALF TABLET
12.5000 mg | ORAL_TABLET | Freq: Two times a day (BID) | ORAL | Status: DC
  Filled 2012-11-03 (×3): qty 1

## 2012-11-03 MED ORDER — DEXTROSE 5 % IV SOLN
1.5000 g | Freq: Two times a day (BID) | INTRAVENOUS | Status: AC
  Administered 2012-11-03 – 2012-11-05 (×4): 1.5 g via INTRAVENOUS
  Filled 2012-11-03 (×4): qty 1.5

## 2012-11-03 MED ORDER — MILRINONE IN DEXTROSE 20 MG/100ML IV SOLN
0.1250 ug/kg/min | INTRAVENOUS | Status: DC
  Filled 2012-11-03: qty 100

## 2012-11-03 MED ORDER — METOPROLOL TARTRATE 25 MG/10 ML ORAL SUSPENSION
12.5000 mg | Freq: Two times a day (BID) | ORAL | Status: DC
  Filled 2012-11-03 (×3): qty 5

## 2012-11-03 MED ORDER — ROCURONIUM BROMIDE 100 MG/10ML IV SOLN
INTRAVENOUS | Status: DC | PRN
  Administered 2012-11-03: 30 mg via INTRAVENOUS
  Administered 2012-11-03 (×2): 50 mg via INTRAVENOUS

## 2012-11-03 MED ORDER — THROMBIN 20000 UNITS EX SOLR
OROMUCOSAL | Status: DC | PRN
  Administered 2012-11-03: 07:00:00 via TOPICAL

## 2012-11-03 MED ORDER — THROMBIN 20000 UNITS EX KIT
PACK | CUTANEOUS | Status: AC
  Filled 2012-11-03: qty 1

## 2012-11-03 MED ORDER — SODIUM CHLORIDE 0.9 % IV SOLN
INTRAVENOUS | Status: DC
  Administered 2012-11-03: 3.4 [IU]/h via INTRAVENOUS
  Filled 2012-11-03: qty 1

## 2012-11-03 MED ORDER — ASPIRIN EC 325 MG PO TBEC
325.0000 mg | DELAYED_RELEASE_TABLET | Freq: Every day | ORAL | Status: DC
  Administered 2012-11-04 – 2012-11-09 (×6): 325 mg via ORAL
  Filled 2012-11-03 (×7): qty 1

## 2012-11-03 MED ORDER — MAGNESIUM SULFATE 40 MG/ML IJ SOLN
4.0000 g | Freq: Once | INTRAMUSCULAR | Status: AC
  Administered 2012-11-03: 4 g via INTRAVENOUS
  Filled 2012-11-03: qty 100

## 2012-11-03 MED ORDER — BISACODYL 5 MG PO TBEC
10.0000 mg | DELAYED_RELEASE_TABLET | Freq: Every day | ORAL | Status: DC
  Administered 2012-11-04 – 2012-11-10 (×5): 10 mg via ORAL
  Filled 2012-11-03 (×5): qty 2

## 2012-11-03 MED ORDER — FUROSEMIDE 10 MG/ML IJ SOLN
20.0000 mg | Freq: Once | INTRAMUSCULAR | Status: AC
  Administered 2012-11-03: 20 mg via INTRAVENOUS

## 2012-11-03 MED ORDER — FENTANYL CITRATE 0.05 MG/ML IJ SOLN
INTRAMUSCULAR | Status: DC | PRN
  Administered 2012-11-03: 50 ug via INTRAVENOUS
  Administered 2012-11-03: 100 ug via INTRAVENOUS
  Administered 2012-11-03: 200 ug via INTRAVENOUS
  Administered 2012-11-03: 50 ug via INTRAVENOUS
  Administered 2012-11-03: 100 ug via INTRAVENOUS
  Administered 2012-11-03: 50 ug via INTRAVENOUS
  Administered 2012-11-03: 100 ug via INTRAVENOUS
  Administered 2012-11-03 (×2): 50 ug via INTRAVENOUS
  Administered 2012-11-03 (×2): 100 ug via INTRAVENOUS

## 2012-11-03 MED ORDER — HEMOSTATIC AGENTS (NO CHARGE) OPTIME
TOPICAL | Status: DC | PRN
  Administered 2012-11-03: 1 via TOPICAL

## 2012-11-03 MED ORDER — FUROSEMIDE 10 MG/ML IJ SOLN
20.0000 mg | INTRAMUSCULAR | Status: DC
  Filled 2012-11-03: qty 4

## 2012-11-03 MED ORDER — ALBUMIN HUMAN 5 % IV SOLN
250.0000 mL | INTRAVENOUS | Status: AC | PRN
Start: 2012-11-03 — End: 2012-11-04
  Administered 2012-11-03 – 2012-11-04 (×2): 250 mL via INTRAVENOUS

## 2012-11-03 MED ORDER — SODIUM CHLORIDE 0.9 % IV SOLN
10.0000 mg | INTRAVENOUS | Status: DC | PRN
  Administered 2012-11-03: 10 ug/min via INTRAVENOUS

## 2012-11-03 MED ORDER — LACTATED RINGERS IV SOLN: 500.0000 mL | Freq: Once | INTRAVENOUS | Status: AC | PRN

## 2012-11-03 MED ORDER — MILRINONE IN DEXTROSE 20 MG/100ML IV SOLN
INTRAVENOUS | Status: DC | PRN
  Administered 2012-11-03: 0.375 ug/kg/min via INTRAVENOUS

## 2012-11-03 MED ORDER — POTASSIUM CHLORIDE 10 MEQ/50ML IV SOLN
10.0000 meq | INTRAVENOUS | Status: AC
  Administered 2012-11-03 (×3): 10 meq via INTRAVENOUS

## 2012-11-03 MED ORDER — ASPIRIN 81 MG PO CHEW
324.0000 mg | CHEWABLE_TABLET | Freq: Every day | ORAL | Status: DC
  Administered 2012-11-10: 324 mg
  Filled 2012-11-03: qty 4

## 2012-11-03 MED ORDER — SODIUM CHLORIDE 0.9 % IJ SOLN
3.0000 mL | Freq: Two times a day (BID) | INTRAMUSCULAR | Status: DC
  Administered 2012-11-04 – 2012-11-09 (×11): 3 mL via INTRAVENOUS

## 2012-11-03 MED ORDER — DOPAMINE-DEXTROSE 3.2-5 MG/ML-% IV SOLN: 3.0000 ug/kg/min | INTRAVENOUS | Status: AC

## 2012-11-03 MED ORDER — FAMOTIDINE IN NACL 20-0.9 MG/50ML-% IV SOLN
20.0000 mg | Freq: Two times a day (BID) | INTRAVENOUS | Status: AC
  Administered 2012-11-03: 20 mg via INTRAVENOUS

## 2012-11-03 MED ORDER — MIDAZOLAM HCL 2 MG/2ML IJ SOLN: 2.0000 mg | INTRAMUSCULAR | Status: DC | PRN

## 2012-11-03 MED ORDER — ACETAMINOPHEN 160 MG/5ML PO SOLN
975.0000 mg | Freq: Four times a day (QID) | ORAL | Status: AC
  Filled 2012-11-03: qty 40.6

## 2012-11-03 MED ORDER — ALBUMIN HUMAN 25 % IV SOLN
INTRAVENOUS | Status: DC | PRN
  Administered 2012-11-03 (×2): via INTRAVENOUS

## 2012-11-03 MED ORDER — THROMBIN 20000 UNITS EX SOLR
CUTANEOUS | Status: AC
  Filled 2012-11-03: qty 20000

## 2012-11-03 MED ORDER — MILRINONE IN DEXTROSE 20 MG/100ML IV SOLN
0.3300 ug/kg/min | INTRAVENOUS | Status: AC
  Filled 2012-11-03: qty 100

## 2012-11-03 MED ORDER — ACETAMINOPHEN 500 MG PO TABS
1000.0000 mg | ORAL_TABLET | Freq: Four times a day (QID) | ORAL | Status: AC
  Administered 2012-11-04 – 2012-11-08 (×16): 1000 mg via ORAL
  Filled 2012-11-03 (×18): qty 2

## 2012-11-03 MED ORDER — HEPARIN SODIUM (PORCINE) 1000 UNIT/ML IJ SOLN
INTRAMUSCULAR | Status: DC | PRN
  Administered 2012-11-03: 22000 [IU] via INTRAVENOUS

## 2012-11-03 MED ORDER — 0.9 % SODIUM CHLORIDE (POUR BTL) OPTIME
TOPICAL | Status: DC | PRN
  Administered 2012-11-03: 1000 mL

## 2012-11-03 MED ORDER — METOPROLOL TARTRATE 1 MG/ML IV SOLN: 2.5000 mg | INTRAVENOUS | Status: DC | PRN

## 2012-11-03 MED ORDER — NITROGLYCERIN IN D5W 200-5 MCG/ML-% IV SOLN
0.0000 ug/min | INTRAVENOUS | Status: DC
  Administered 2012-11-03: 10 ug/min via INTRAVENOUS

## 2012-11-03 MED ORDER — SODIUM CHLORIDE 0.45 % IV SOLN: INTRAVENOUS | Status: DC

## 2012-11-03 MED ORDER — THROMBIN 20000 UNITS EX KIT
PACK | CUTANEOUS | Status: DC | PRN
  Administered 2012-11-03: 20000 [IU] via TOPICAL

## 2012-11-03 MED ORDER — BISACODYL 10 MG RE SUPP: 10.0000 mg | Freq: Every day | RECTAL | Status: DC

## 2012-11-03 MED ORDER — PHENYLEPHRINE HCL 10 MG/ML IJ SOLN
20.0000 mg | INTRAVENOUS | Status: DC | PRN
  Administered 2012-11-03: 10 ug/min via INTRAVENOUS

## 2012-11-03 SURGICAL SUPPLY — 107 items
APL SKNCLS STERI-STRIP NONHPOA (GAUZE/BANDAGES/DRESSINGS) ×2
ATTRACTOMAT 16X20 MAGNETIC DRP (DRAPES) ×3 IMPLANT
BAG DECANTER FOR FLEXI CONT (MISCELLANEOUS) ×3 IMPLANT
BANDAGE ELASTIC 4 VELCRO ST LF (GAUZE/BANDAGES/DRESSINGS) ×3 IMPLANT
BANDAGE ELASTIC 6 VELCRO ST LF (GAUZE/BANDAGES/DRESSINGS) ×3 IMPLANT
BANDAGE GAUZE ELAST BULKY 4 IN (GAUZE/BANDAGES/DRESSINGS) ×3 IMPLANT
BASKET HEART (ORDER IN 25'S) (MISCELLANEOUS) ×1
BASKET HEART (ORDER IN 25S) (MISCELLANEOUS) ×2 IMPLANT
BENZOIN TINCTURE PRP APPL 2/3 (GAUZE/BANDAGES/DRESSINGS) ×3 IMPLANT
BLADE STERNUM SYSTEM 6 (BLADE) ×3 IMPLANT
BLADE SURG 11 STRL SS (BLADE) ×3 IMPLANT
CANISTER SUCTION 2500CC (MISCELLANEOUS) ×3 IMPLANT
CARDIAC SUCTION (MISCELLANEOUS) ×3 IMPLANT
CATH ROBINSON RED A/P 18FR (CATHETERS) ×6 IMPLANT
CATH THORACIC 28FR (CATHETERS) ×3 IMPLANT
CATH THORACIC 28FR RT ANG (CATHETERS) IMPLANT
CATH THORACIC 36FR (CATHETERS) ×3 IMPLANT
CATH THORACIC 36FR RT ANG (CATHETERS) ×3 IMPLANT
CLIP TI MEDIUM 24 (CLIP) IMPLANT
CLIP TI WIDE RED SMALL 24 (CLIP) ×3 IMPLANT
CLOTH BEACON ORANGE TIMEOUT ST (SAFETY) ×3 IMPLANT
CLSR STERI-STRIP ANTIMIC 1/2X4 (GAUZE/BANDAGES/DRESSINGS) ×3 IMPLANT
COVER SURGICAL LIGHT HANDLE (MISCELLANEOUS) ×3 IMPLANT
CRADLE DONUT ADULT HEAD (MISCELLANEOUS) ×3 IMPLANT
DRAPE CARDIOVASCULAR INCISE (DRAPES) ×3
DRAPE SLUSH MACHINE 52X66 (DRAPES) IMPLANT
DRAPE SLUSH/WARMER DISC (DRAPES) ×3 IMPLANT
DRAPE SRG 135X102X78XABS (DRAPES) ×2 IMPLANT
DRSG COVADERM 4X14 (GAUZE/BANDAGES/DRESSINGS) ×3 IMPLANT
ELECT CAUTERY BLADE 6.4 (BLADE) ×3 IMPLANT
ELECT REM PT RETURN 9FT ADLT (ELECTROSURGICAL) ×6
ELECTRODE REM PT RTRN 9FT ADLT (ELECTROSURGICAL) ×4 IMPLANT
GLOVE BIO SURGEON STRL SZ 6 (GLOVE) ×6 IMPLANT
GLOVE BIO SURGEON STRL SZ 6.5 (GLOVE) ×3 IMPLANT
GLOVE BIO SURGEON STRL SZ7 (GLOVE) IMPLANT
GLOVE BIO SURGEON STRL SZ7.5 (GLOVE) IMPLANT
GLOVE BIOGEL PI IND STRL 6 (GLOVE) ×2 IMPLANT
GLOVE BIOGEL PI IND STRL 6.5 (GLOVE) ×14 IMPLANT
GLOVE BIOGEL PI IND STRL 7.0 (GLOVE) ×4 IMPLANT
GLOVE BIOGEL PI INDICATOR 6 (GLOVE) ×1
GLOVE BIOGEL PI INDICATOR 6.5 (GLOVE) ×7
GLOVE BIOGEL PI INDICATOR 7.0 (GLOVE) ×2
GLOVE EUDERMIC 7 POWDERFREE (GLOVE) ×6 IMPLANT
GLOVE ORTHO TXT STRL SZ7.5 (GLOVE) IMPLANT
GOWN PREVENTION PLUS XLARGE (GOWN DISPOSABLE) ×6 IMPLANT
GOWN STRL NON-REIN LRG LVL3 (GOWN DISPOSABLE) ×12 IMPLANT
HEMOSTAT POWDER SURGIFOAM 1G (HEMOSTASIS) ×9 IMPLANT
HEMOSTAT SURGICEL 2X14 (HEMOSTASIS) ×3 IMPLANT
INSERT FOGARTY 61MM (MISCELLANEOUS) IMPLANT
INSERT FOGARTY XLG (MISCELLANEOUS) IMPLANT
KIT BASIN OR (CUSTOM PROCEDURE TRAY) ×3 IMPLANT
KIT CATH CPB BARTLE (MISCELLANEOUS) ×3 IMPLANT
KIT ROOM TURNOVER OR (KITS) ×3 IMPLANT
KIT SUCTION CATH 14FR (SUCTIONS) ×3 IMPLANT
KIT VASOVIEW W/TROCAR VH 2000 (KITS) ×3 IMPLANT
NS IRRIG 1000ML POUR BTL (IV SOLUTION) ×18 IMPLANT
PACK OPEN HEART (CUSTOM PROCEDURE TRAY) ×3 IMPLANT
PAD ARMBOARD 7.5X6 YLW CONV (MISCELLANEOUS) ×6 IMPLANT
PAD DEFIB R2 (MISCELLANEOUS) ×3 IMPLANT
PENCIL BUTTON BLDE SNGL 10FT (ELECTRODE) ×3 IMPLANT
PENCIL BUTTON HOLSTER BLD 10FT (ELECTRODE) ×6 IMPLANT
PUNCH AORTIC ROTATE 4.0MM (MISCELLANEOUS) ×3 IMPLANT
PUNCH AORTIC ROTATE 4.5MM 8IN (MISCELLANEOUS) ×3 IMPLANT
PUNCH AORTIC ROTATE 5MM 8IN (MISCELLANEOUS) IMPLANT
SET CARDIOPLEGIA MPS 5001102 (MISCELLANEOUS) ×3 IMPLANT
SPONGE GAUZE 4X4 12PLY (GAUZE/BANDAGES/DRESSINGS) ×9 IMPLANT
SPONGE INTESTINAL PEANUT (DISPOSABLE) IMPLANT
SPONGE LAP 18X18 X RAY DECT (DISPOSABLE) IMPLANT
SPONGE LAP 4X18 X RAY DECT (DISPOSABLE) ×3 IMPLANT
SUT BONE WAX W31G (SUTURE) ×3 IMPLANT
SUT MNCRL AB 4-0 PS2 18 (SUTURE) ×3 IMPLANT
SUT PROLENE 3 0 SH DA (SUTURE) IMPLANT
SUT PROLENE 3 0 SH1 36 (SUTURE) ×3 IMPLANT
SUT PROLENE 4 0 RB 1 (SUTURE)
SUT PROLENE 4 0 SH DA (SUTURE) IMPLANT
SUT PROLENE 4-0 RB1 .5 CRCL 36 (SUTURE) IMPLANT
SUT PROLENE 5 0 C 1 36 (SUTURE) IMPLANT
SUT PROLENE 6 0 C 1 30 (SUTURE) IMPLANT
SUT PROLENE 7 0 BV 1 (SUTURE) IMPLANT
SUT PROLENE 7 0 BV1 MDA (SUTURE) ×3 IMPLANT
SUT PROLENE 8 0 BV175 6 (SUTURE) ×3 IMPLANT
SUT SILK  1 MH (SUTURE)
SUT SILK 1 MH (SUTURE) IMPLANT
SUT STEEL 6MS V (SUTURE) ×6 IMPLANT
SUT STEEL STERNAL CCS#1 18IN (SUTURE) IMPLANT
SUT STEEL SZ 6 DBL 3X14 BALL (SUTURE) IMPLANT
SUT VIC AB 1 CTX 36 (SUTURE) ×6
SUT VIC AB 1 CTX36XBRD ANBCTR (SUTURE) ×4 IMPLANT
SUT VIC AB 2-0 CT1 27 (SUTURE) ×3
SUT VIC AB 2-0 CT1 TAPERPNT 27 (SUTURE) ×2 IMPLANT
SUT VIC AB 2-0 CTX 27 (SUTURE) IMPLANT
SUT VIC AB 3-0 SH 27 (SUTURE)
SUT VIC AB 3-0 SH 27X BRD (SUTURE) IMPLANT
SUT VIC AB 3-0 X1 27 (SUTURE) IMPLANT
SUT VICRYL 4-0 PS2 18IN ABS (SUTURE) IMPLANT
SUTURE E-PAK OPEN HEART (SUTURE) ×3 IMPLANT
SYSTEM SAHARA CHEST DRAIN ATS (WOUND CARE) ×3 IMPLANT
TAPE CLOTH SURG 4X10 WHT LF (GAUZE/BANDAGES/DRESSINGS) ×6 IMPLANT
TAPE PAPER 2X10 WHT MICROPORE (GAUZE/BANDAGES/DRESSINGS) ×3 IMPLANT
TOWEL OR 17X24 6PK STRL BLUE (TOWEL DISPOSABLE) ×3 IMPLANT
TOWEL OR 17X26 10 PK STRL BLUE (TOWEL DISPOSABLE) ×3 IMPLANT
TRAY FOLEY IC TEMP SENS 14FR (CATHETERS) ×3 IMPLANT
TUBE SUCT INTRACARD DLP 20F (MISCELLANEOUS) ×3 IMPLANT
TUBING INSUFFLATION 10FT LAP (TUBING) ×3 IMPLANT
UNDERPAD 30X30 INCONTINENT (UNDERPADS AND DIAPERS) ×3 IMPLANT
WATER STERILE IRR 1000ML POUR (IV SOLUTION) ×6 IMPLANT
YANKAUER SUCT BULB TIP NO VENT (SUCTIONS) ×3 IMPLANT

## 2012-11-03 NOTE — Anesthesia Preprocedure Evaluation (Addendum)
Anesthesia Evaluation  Patient identified by MRN, date of birth, ID band Patient awake    Reviewed: Allergy & Precautions, H&P , NPO status   Airway Mallampati: II  Neck ROM: Full    Dental  (+) Teeth Intact   Pulmonary shortness of breath,  breath sounds clear to auscultation        Cardiovascular + angina +CHF and + Orthopnea + dysrhythmias Rhythm:Regular Rate:Normal     Neuro/Psych    GI/Hepatic negative GI ROS,   Endo/Other  negative endocrine ROS  Renal/GU negative Renal ROS     Musculoskeletal   Abdominal   Peds  Hematology negative hematology ROS (+)   Anesthesia Other Findings   Reproductive/Obstetrics                         Anesthesia Physical Anesthesia Plan  ASA: III  Anesthesia Plan: General   Post-op Pain Management:    Induction: Intravenous  Airway Management Planned: Oral ETT  Additional Equipment: Arterial line, PA Cath, TEE and Ultrasound Guidance Line Placement  Intra-op Plan:   Post-operative Plan: Extubation in OR  Informed Consent: I have reviewed the patients History and Physical, chart, labs and discussed the procedure including the risks, benefits and alternatives for the proposed anesthesia with the patient or authorized representative who has indicated his/her understanding and acceptance.   Dental advisory given  Plan Discussed with: CRNA and Surgeon  Anesthesia Plan Comments:         Anesthesia Quick Evaluation

## 2012-11-03 NOTE — Progress Notes (Signed)
  Echocardiogram Echocardiogram Transesophageal has been performed.  Georgian Co 11/03/2012, 8:36 AM

## 2012-11-03 NOTE — Preoperative (Signed)
Beta Blockers   Reason not to administer Beta Blockers:Not Applicable 

## 2012-11-03 NOTE — Progress Notes (Signed)
Patient ID: [name], female   DOB: [birth year], 72 y.o.   MRN: [id number]                   [address]            [place] [address]          [phone]     Day of Surgery Procedure(s) (LRB): CORONARY ARTERY BYPASS GRAFTING (CABG) (N/A) INTRAOPERATIVE TRANSESOPHAGEAL ECHOCARDIOGRAM (N/A)  Total Length of Stay:  LOS: 4 days  BP 76/45  Pulse 86  Temp 100 F (37.8 C) (Oral)  Resp 13  Ht 5\' 2"  (1.575 m)  Wt 118 lb 9.7 oz (53.8 kg)  BMI 21.69 kg/m2  SpO2 98%  .Intake/Output      01/16 0701 - 01/17 0700 01/17 0701 - 01/18 0700   P.O. 760    I.V. (mL/kg) 82.5 (1.5) 2633.4 (48.9)   Blood  560   Other 240    IV Piggyback  650   Total Intake(mL/kg) 1082.5 (20.1) 3843.4 (71.4)   Urine (mL/kg/hr)  3650 (6.2)   Blood  1000   Chest Tube  120   Total Output  4770   Net +1082.5 -926.6        Urine Occurrence 4 x         . sodium chloride 20 mL/hr (11/03/12 1300)  . sodium chloride 20 mL/hr (11/03/12 1300)  . dexmedetomidine Stopped (11/03/12 1500)  . DOPamine 3 mcg/kg/min (11/03/12 1700)  . insulin (NOVOLIN-R) infusion 2 Units/hr (11/03/12 1700)  . lactated ringers 20 mL/hr (11/03/12 1300)  . milrinone 0.33 mcg/kg/min (11/03/12 1700)  . nitroGLYCERIN 10 mcg/min (11/03/12 1700)  . phenylephrine (NEO-SYNEPHRINE) Adult infusion Stopped (11/03/12 1400)     Lab Results  Component Value Date   WBC 7.0 11/03/2012   HGB 11.6* 11/03/2012   HCT 34.4* 11/03/2012   PLT 97* 11/03/2012   GLUCOSE 144* 11/03/2012   CHOL 197 07/31/2007   TRIG 377* 07/31/2007   HDL 29.0* 07/31/2007   LDLDIRECT 122.2 07/31/2007   ALT 12 02/17/2010   AST 18 02/17/2010   NA 140 11/03/2012   K 3.8 11/03/2012   CL 100 11/03/2012   CREATININE 0.85 11/03/2012   BUN 14 11/03/2012   CO2 28 11/03/2012   TSH 2.14 05/18/2007   INR 1.32 11/03/2012   HGBA1C 6.2* 11/02/2012   Now extubated Not bleeding stable  Delight Ovens MD  Beeper [phone] Office [phone] 11/03/2012 5:54 PM

## 2012-11-03 NOTE — Brief Op Note (Addendum)
10/30/2012 - 11/03/2012  10:46 AM  PATIENT:  [name]  72 y.o. female  PRE-OPERATIVE DIAGNOSIS:  CAD  POST-OPERATIVE DIAGNOSIS:  CAD  PROCEDURE:   CORONARY ARTERY BYPASS GRAFTING x 4 (LIMA-LAD, SVG-OM1, SVG-PD-PL), EVH right leg  SURGEON:  Surgeon(s): Alleen Borne, MD  ASSISTANT: Coral Ceo, PA-C  ANESTHESIA:   general  PATIENT CONDITION:  ICU - intubated and hemodynamically stable.  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

## 2012-11-03 NOTE — Transfer of Care (Signed)
Immediate Anesthesia Transfer of Care Note  Patient: [name]  Procedure(s) Performed: Procedure(s) (LRB) with comments: CORONARY ARTERY BYPASS GRAFTING (CABG) (N/A) INTRAOPERATIVE TRANSESOPHAGEAL ECHOCARDIOGRAM (N/A)  Patient Location: SICU  Anesthesia Type:General  Level of Consciousness: sedated  Airway & Oxygen Therapy: Patient remains intubated per anesthesia plan  Post-op Assessment: Report given to PACU RN and Post -op Vital signs reviewed and stable  Post vital signs: Reviewed and stable  Complications: No apparent anesthesia complications

## 2012-11-03 NOTE — Progress Notes (Signed)
Pt extubated to 4L [place] per SICU rapid wean protocol.  NIF -25, pt does not speak english, instructions given to daughter and she instructed pt.  Pt tolerating extubation well. Able to answer questions that daughter ask her. O2 sat 96%. RT will continue to monitor.

## 2012-11-04 ENCOUNTER — Inpatient Hospital Stay (HOSPITAL_COMMUNITY): Payer: Medicare Other

## 2012-11-04 LAB — BASIC METABOLIC PANEL
BUN: 11 mg/dL (ref 6–23)
CO2: 24 mEq/L (ref 19–32)
Chloride: 103 mEq/L (ref 96–112)
GFR calc Af Amer: 83 mL/min — ABNORMAL LOW (ref >90)
Potassium: 4 mEq/L (ref 3.5–5.1)

## 2012-11-04 LAB — CREATININE, SERUM
GFR calc Af Amer: >90 mL/min (ref >90)
GFR calc non Af Amer: 87 mL/min — ABNORMAL LOW (ref >90)

## 2012-11-04 LAB — GLUCOSE, CAPILLARY
Glucose-Capillary: 109 mg/dL — ABNORMAL HIGH (ref 70–99)
Glucose-Capillary: 143 mg/dL — ABNORMAL HIGH (ref 70–99)
Glucose-Capillary: 80 mg/dL (ref 70–99)
Glucose-Capillary: 137 mg/dL — ABNORMAL HIGH (ref 70–99)
Glucose-Capillary: 115 mg/dL — ABNORMAL HIGH (ref 70–99)
Glucose-Capillary: 100 mg/dL — ABNORMAL HIGH (ref 70–99)

## 2012-11-04 LAB — POCT I-STAT, CHEM 8
Calcium, Ion: 1.15 mmol/L (ref 1.13–1.30)
Glucose, Bld: 141 mg/dL — ABNORMAL HIGH (ref 70–99)
HCT: 29 % — ABNORMAL LOW (ref 36.0–46.0)
Hemoglobin: 9.9 g/dL — ABNORMAL LOW (ref 12.0–15.0)

## 2012-11-04 LAB — CBC
HCT: 29.9 % — ABNORMAL LOW (ref 36.0–46.0)
HCT: 29.7 % — ABNORMAL LOW (ref 36.0–46.0)
Hemoglobin: 9.9 g/dL — ABNORMAL LOW (ref 12.0–15.0)
Platelets: 99 10*3/uL — ABNORMAL LOW (ref 150–400)
RBC: 3.78 MIL/uL — ABNORMAL LOW (ref 3.87–5.11)
RBC: 3.7 MIL/uL — ABNORMAL LOW (ref 3.87–5.11)
RDW: 16 % — ABNORMAL HIGH (ref 11.5–15.5)
RDW: 16.5 % — ABNORMAL HIGH (ref 11.5–15.5)
WBC: 9.5 10*3/uL (ref 4.0–10.5)
WBC: 8.7 10*3/uL (ref 4.0–10.5)

## 2012-11-04 LAB — MAGNESIUM
Magnesium: 2.7 mg/dL — ABNORMAL HIGH (ref 1.5–2.5)
Magnesium: 2.5 mg/dL (ref 1.5–2.5)

## 2012-11-04 MED ORDER — ENOXAPARIN SODIUM 30 MG/0.3ML ~~LOC~~ SOLN
30.0000 mg | Freq: Every day | SUBCUTANEOUS | Status: DC
  Filled 2012-11-04: qty 0.3

## 2012-11-04 MED ORDER — LISINOPRIL 2.5 MG PO TABS
2.5000 mg | ORAL_TABLET | Freq: Every day | ORAL | Status: DC
  Administered 2012-11-04 – 2012-11-06 (×2): 2.5 mg via ORAL
  Filled 2012-11-04 (×5): qty 1

## 2012-11-04 MED ORDER — CARVEDILOL 3.125 MG PO TABS
3.1250 mg | ORAL_TABLET | Freq: Two times a day (BID) | ORAL | Status: DC
  Administered 2012-11-06 – 2012-11-07 (×3): 3.125 mg via ORAL
  Filled 2012-11-04 (×10): qty 1

## 2012-11-04 MED ORDER — CARVEDILOL 6.25 MG PO TABS
6.2500 mg | ORAL_TABLET | Freq: Two times a day (BID) | ORAL | Status: DC
  Administered 2012-11-04: 6.25 mg via ORAL
  Filled 2012-11-04 (×2): qty 1

## 2012-11-04 MED ORDER — ALBUMIN HUMAN 5 % IV SOLN
INTRAVENOUS | Status: AC
  Administered 2012-11-04: 250 mL via INTRAVENOUS
  Filled 2012-11-04: qty 250

## 2012-11-04 MED ORDER — INSULIN ASPART 100 UNIT/ML ~~LOC~~ SOLN
0.0000 [IU] | SUBCUTANEOUS | Status: AC
  Administered 2012-11-04: 2 [IU] via SUBCUTANEOUS

## 2012-11-04 MED ORDER — INSULIN ASPART 100 UNIT/ML ~~LOC~~ SOLN: 0.0000 [IU] | SUBCUTANEOUS | Status: DC

## 2012-11-04 MED ORDER — ALBUMIN HUMAN 5 % IV SOLN: 12.5000 g | Freq: Once | INTRAVENOUS | Status: AC

## 2012-11-04 MED ORDER — FUROSEMIDE 10 MG/ML IJ SOLN: 20.0000 mg | Freq: Once | INTRAMUSCULAR | Status: DC

## 2012-11-04 MED ORDER — INSULIN ASPART 100 UNIT/ML ~~LOC~~ SOLN
0.0000 [IU] | Freq: Three times a day (TID) | SUBCUTANEOUS | Status: DC
  Administered 2012-11-04: 2 [IU] via SUBCUTANEOUS
  Administered 2012-11-05: 4 [IU] via SUBCUTANEOUS
  Administered 2012-11-05 – 2012-11-06 (×2): 2 [IU] via SUBCUTANEOUS
  Administered 2012-11-06: 4 [IU] via SUBCUTANEOUS
  Administered 2012-11-08: 2 [IU] via SUBCUTANEOUS
  Administered 2012-11-08: 100 [IU] via SUBCUTANEOUS
  Administered 2012-11-08: 2 [IU] via SUBCUTANEOUS

## 2012-11-04 NOTE — Op Note (Signed)
[name], RUNDLE NO.:  [id number]  MEDICAL RECORD NO.:  [id number]  LOCATION:  2306                         FACILITY:  [facility]  PHYSICIAN:  Burna Forts, M.D.DATE OF BIRTH:  10-20-1940  DATE OF PROCEDURE:  11/03/2012 DATE OF DISCHARGE:                              OPERATIVE REPORT   INDICATIONS FOR PROCEDURE:  [name] is a 72 year old patient of Dr. [name] who presents today for coronary artery bypass grafting to be performed by Dr. Evelene Croon.  She is brought to the holding area the morning of surgery where under local anesthesia with sedation, pulmonary artery and radial arterial lines were placed.  After induction of general anesthesia, the trachea is intubated followed by preparation and placement of a TEE probe in preparation for imaging of the cardiac structures.  PRE-CARDIOPULMONARY BYPASS TEE EXAMINATION: 1. Left ventricle:  The left ventricular chamber is seen initially in     the short axis view.  It is globally dilated and globally depressed     in its function.  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     The distal 1/3rd of the left ventricular chambers seen in this four-     chamber view is thin walled and akinetic.  Overall estimated     ejection fraction is at or  below 20%.  Multiple views are obtained to     analyze this aneurysmal akinetic apical area of the left     ventricular chamber.  There is contractility appreciated in the inferior wall, it is hypokinetic.  There is contractile activity in the posterior and lateral wall, which also is hypokinetic.  The septal wall as seen is also akinetic.  There are no thrombi within the LV chamber itself.  1. Mitral valve.  Attention was then given to the mitral valve in the     four-chamber view.  There is mild thickening of the leaflets of the     mitral valve.  There is some dilatation of the  annular area of the     mitral valve appreciated.  There is no prolapse and there are no     flail segments appreciated.  On color Doppler, there is trace to     trivial mitral regurgitant flow appreciated.  1. Left atrium.  Left atrium is interrogated.  No masses are     appreciated within.  The interatrial septum is interrogated and     appears intact.  1. Aortic valve.  The aortic valve has mild aortic sclerosis.  It is     seen initially in the short axis view.  There are 3 cusps.  Overall     there is satisfactory opening to systolic ejection.  There is no     aortic insufficiency appreciated.  1. Right atrium.  The right atrium is seen and interrogated.  There     are 2 lines within the right atrial chamber itself, which likely     represents the [place]coe placement and the lead of the AICD, which the     patient has had implanted.  Also lead is a  fibrinous appearing 1 cm     thin mobile structure seen.  This is likely a small amount of     fibrinous thrombi appreciated attached to the lead of the AICD.     Multiple views are taken of this as well.  1. Right ventricle.  The right ventricular chamber is a normal chamber     in size and function.  The patient is placed on cardiopulmonary bypass, coronary artery bypass grafting is carried out.  The patient is ultimately separated from the cardiopulmonary bypass with the initial attempt.  POST CARDIOPULMONARY BYPASS TEE EXAMINATION:  Early in the post bypass period, there was significant left ventricular depression or function associated with good bit of mitral regurgitant flow as reflected in pulmonary artery pressures, and 1 to 2+ jet of mitral regurgitant flow appreciated with color Doppler.  Again multiple views of the left ventricular chamber both short-axis and long-axis were now obtained. The area the apical thinning and aneurysmal dilatation area remained akinetic.  There is more activity appreciated now in the inferior  wall and posterior lateral wall with an increased activity and contractile pattern appreciated.  However, from the time of separation of cardiopulmonary bypass for about 15-20 minutes, there was the significant new jet of mitral regurgitant flow appreciated ultimately, this did appear to improve and there was decrease in the PA pressures, decrease in the jet of mitral regurgitant flow and associated with improved left ventricular activity.  Again pictures demonstrated diminution of the mitral regurgitant jet as compared to the early post bypass period.  There was also improved LV function appreciated though not be in the distal 1/3rd of the left ventricular chamber, which was essentially akinetic and aneurysmal.  The rest of the cardiac examination was as previously described, and the patient was returned to the cardiac intensive care unit in stable condition.          ______________________________ Burna Forts, M.D.     JTM/MEDQ  D:  11/03/2012  T:  11/04/2012  Job:  [id number]

## 2012-11-04 NOTE — Progress Notes (Signed)
Patient ID: [name], female   DOB: [birth year], 72 y.o.   MRN: [id number] [facility] DAILY PROGRESS NOTE                   [address]            [place] [address]          [phone]      1 Day Post-Op Procedure(s) (LRB): CORONARY ARTERY BYPASS GRAFTING (CABG) (N/A) INTRAOPERATIVE TRANSESOPHAGEAL ECHOCARDIOGRAM (N/A)  Total Length of Stay:  LOS: 5 days   Subjective: Extubated, awake and alert   Objective: Vital signs in last 24 hours: Temp:  [95.5 F (35.3 C)-100.4 F (38 C)] 99.1 F (37.3 C) (01/18 0844) Pulse Rate:  [78-98] 94  (01/18 0844) Cardiac Rhythm:  [-] Normal sinus rhythm (01/18 0742) Resp:  [12-34] 18  (01/18 0844) BP: (76-106)/(43-62) 103/52 mmHg (01/18 0800) SpO2:  [96 %-100 %] 99 % (01/18 0844) Arterial Line BP: (91-152)/(40-66) 134/46 mmHg (01/18 0844) FiO2 (%):  [39.9 %-51.4 %] 40 % (01/17 1713)  Filed Weights   11/01/12 0321 11/02/12 0500 11/03/12 0500  Weight: 118 lb 6.2 oz (53.7 kg) 118 lb 6.2 oz (53.7 kg) 118 lb 9.7 oz (53.8 kg)    Weight change:    Hemodynamic parameters for last 24 hours: PAP: (21-41)/(8-23) 31/14 mmHg CO:  [2 L/min-4.8 L/min] 3.8 L/min CI:  [1.3 L/min/m2-3.1 L/min/m2] 2.5 L/min/m2  Intake/Output from previous day: 01/17 0701 - 01/18 0700 In: 5506.2 [I.V.:3792.2; Blood:560; IV Piggyback:1154] Out: 6962 [XBMWU:1324; Emesis/NG output:50; Blood:1000; Chest Tube:320]  Intake/Output this shift: Total I/O In: 69.8 [I.V.:69.8] Out: 140 [Urine:140]  Current Meds: Scheduled Meds:   . acetaminophen  1,000 mg Oral Q6H   Or  . acetaminophen (TYLENOL) oral liquid 160 mg/5 mL  975 mg Per Tube Q6H  . aspirin EC  325 mg Oral Daily   Or  . aspirin  324 mg Per Tube Daily  . bisacodyl  10 mg Oral Daily   Or  . bisacodyl  10 mg Rectal Daily  . cefUROXime (ZINACEF)  IV  1.5 g Intravenous Q12H  . docusate sodium  200 mg Oral Daily  . famotidine (PEPCID) IV  20 mg Intravenous Q12H  . gabapentin  100 mg Oral  QHS  . insulin aspart  0-24 Units Subcutaneous Q4H  . insulin regular  0-10 Units Intravenous TID WC  . metoprolol tartrate  12.5 mg Oral BID   Or  . metoprolol tartrate  12.5 mg Per Tube BID  . pantoprazole  40 mg Oral Daily  . simvastatin  20 mg Oral QHS  . sodium chloride  3 mL Intravenous Q12H   Continuous Infusions:   . sodium chloride 20 mL/hr (11/03/12 1300)  . sodium chloride 10 mL/hr at 11/04/12 0100  . dexmedetomidine Stopped (11/03/12 1500)  . DOPamine 3 mcg/kg/min (11/03/12 1900)  . insulin (NOVOLIN-R) infusion 1.3 mL/hr at 11/03/12 1900  . lactated ringers 20 mL/hr (11/03/12 1300)  . milrinone 0.33 mcg/kg/min (11/03/12 1900)  . nitroGLYCERIN Stopped (11/03/12 1800)  . phenylephrine (NEO-SYNEPHRINE) Adult infusion 5.067 mcg/min (11/03/12 1900)   PRN Meds:.sodium chloride, albumin human, metoprolol, midazolam, morphine injection, ondansetron (ZOFRAN) IV, oxyCODONE, sodium chloride  General appearance: alert, cooperative and mild distress Neurologic: intact Heart: regular rate and rhythm, S1, S2 normal, no murmur, click, rub or gallop and normal apical impulse Lungs: clear to auscultation bilaterally and normal percussion bilaterally Abdomen: soft, non-tender; bowel sounds normal; no masses,  no organomegaly  Extremities: extremities normal, atraumatic, no cyanosis or edema and Homans sign is negative, no sign of DVT Wound: sternum stable  Lab Results: CBC: Basename 11/04/12 0400 11/03/12 1817 11/03/12 1815  WBC 9.5 -- 9.9  HGB 10.1* 11.6* --  HCT 29.9* 34.0* --  PLT 99* -- 115*   BMET:  Basename 11/04/12 0400 11/03/12 1817 11/03/12 0533  NA 137 138 --  K 4.0 4.9 --  CL 103 105 --  CO2 24 -- 28  GLUCOSE 148* 126* --  BUN 11 12 --  CREATININE 0.82 0.90 --  CALCIUM 7.9* -- 9.7    PT/INR:  Basename 11/03/12 1305  LABPROT 16.1*  INR 1.32   Radiology: Dg Chest Portable 1 View In Am  11/04/2012  *RADIOLOGY REPORT*  Clinical Data: Chest pain and congestion  postop.  PORTABLE CHEST - 1 VIEW  Comparison: 11/03/2012  Findings: Postoperative changes in the mediastinum and cardiac pacemaker are stable.  Left chest tube and two mediastinal drains are in place.  Right Swan-Ganz catheter with tip over the right pulmonary artery.  Mild cardiac enlargement.  Pulmonary vascularity and edema are improving.  Linear atelectasis in the left mid lung and right lung base.  Suggestion of a small residual left apical pneumothorax.  IMPRESSION: Appliances appear in satisfactory position.  Suggestion of small residual left apical pneumothorax with left chest tube in place. Improving pulmonary vascular congestive changes.   Original Report Authenticated By: Burman Nieves, M.D.    Dg Chest Portable 1 View  11/03/2012  *RADIOLOGY REPORT*  Clinical Data: Postop CABG  PORTABLE CHEST - 1 VIEW  Comparison: Chest radiograph 11/02/2012  Findings: Endotracheal tube terminates 3 cm above carina.  Right IJ sheath transmits a Swan-Ganz catheter with the distal tip in the right main pulmonary artery.  A nasogastric tube courses into the stomach and continues below the edge of the image.  The left chest tube terminates near the medial left lung apex.  A second left chest tube terminates at the lateral left lung base.  Mediastinal drain terminates near the midline at the level of the aortic arch. Left chest wall single lead AICD is stable.  Epicardial pacer wires present.  Changes of median sternotomy for CABG.  There is mild cardiomegaly and mild pulmonary vascular congestion.  Interstitial markings are mildly increased bilaterally.  There is streaky left basilar atelectasis.  No definite pleural effusions visible. Negative for pneumothorax.  No acute osseous abnormality.  IMPRESSION:  1. Stable cardiomegaly status post CABG with mild pulmonary vascular congestion and mild interstitial pulmonary edema. 2.  Satisfactory position of support devices.   Original Report Authenticated By: Britta Mccreedy,  M.D.    Dg Chest Port 1 View  11/02/2012  *RADIOLOGY REPORT*  Clinical Data: Preop CABG.  PORTABLE CHEST - 1 VIEW  Comparison: 03/29/2005  Findings: Left AICD remains in place, unchanged.  Single lead tip is in the right ventricle.  Mild cardiomegaly.  No confluent airspace opacities, effusions or edema.  No acute bony abnormality.  IMPRESSION: Left single lead AICD.  Mild cardiomegaly.  No acute findings.   Original Report Authenticated By: Charlett Nose, M.D.      Assessment/Plan: S/P Procedure(s) (LRB): CORONARY ARTERY BYPASS GRAFTING (CABG) (N/A) INTRAOPERATIVE TRANSESOPHAGEAL ECHOCARDIOGRAM (N/A) Mobilize Diuresis d/c tubes/lines See progression orders Expected Acute  Blood - loss Anemia preop: nuclear adenosine stress test which showed  infarction in the distribution of the LAD and possibly the right  coronary arteries with modest peri-infarct ischemia and severe left  ventricular dysfunction with ejection fraction of 31%. Cardiac  catheterization showed normal right heart pressures.  Remains on dopamine, milrinone      [name] 11/04/2012 8:57 AM

## 2012-11-04 NOTE — Progress Notes (Signed)
Patient ID: [name], female   DOB: 09/04/1941, 72 y.o.   MRN: [id number]                   [address]            [place] [address]          [phone]     1 Day Post-Op Procedure(s) (LRB): CORONARY ARTERY BYPASS GRAFTING (CABG) (N/A) INTRAOPERATIVE TRANSESOPHAGEAL ECHOCARDIOGRAM (N/A)  Total Length of Stay:  LOS: 5 days  BP 107/49  Pulse 82  Temp 97.6 F (36.4 C) (Oral)  Resp 21  Ht 5\' 2"  (1.575 m)  Wt 118 lb 9.7 oz (53.8 kg)  BMI 21.69 kg/m2  SpO2 99%  .Intake/Output      01/17 0701 - 01/18 0700 01/18 0701 - 01/19 0700   P.O.  360   I.V. (mL/kg) 3792.2 (70.5) 442.7 (8.2)   Blood 560    Other     IV Piggyback 1154 50   Total Intake(mL/kg) 5506.2 (102.3) 852.7 (15.8)   Urine (mL/kg/hr) 4415 (3.4) 450 (0.8)   Emesis/NG output 50    Blood 1000    Chest Tube 320 50   Total Output 5785 500   Net -278.8 +352.7             . sodium chloride 20 mL/hr (11/03/12 1300)  . sodium chloride 10 mL/hr at 11/04/12 0100  . dexmedetomidine Stopped (11/03/12 1500)  . DOPamine 3 mcg/kg/min (11/03/12 1900)  . lactated ringers 20 mL/hr (11/03/12 1300)  . nitroGLYCERIN Stopped (11/03/12 1800)  . phenylephrine (NEO-SYNEPHRINE) Adult infusion 5.067 mcg/min (11/03/12 1900)     Lab Results  Component Value Date   WBC 8.7 11/04/2012   HGB 9.9* 11/04/2012   HCT 29.0* 11/04/2012   PLT 91* 11/04/2012   GLUCOSE 141* 11/04/2012   CHOL 197 07/31/2007   TRIG 377* 07/31/2007   HDL 29.0* 07/31/2007   LDLDIRECT 122.2 07/31/2007   ALT 12 02/17/2010   AST 18 02/17/2010   NA 136 11/04/2012   K 4.3 11/04/2012   CL 101 11/04/2012   CREATININE 0.70 11/04/2012   BUN 10 11/04/2012   CO2 24 11/04/2012   TSH 2.14 05/18/2007   INR 1.32 11/03/2012   HGBA1C 6.2* 11/02/2012   milrinone now off, still on dopamine Will give lasix tonight  Delight Ovens MD  Beeper [phone] Office [phone] 11/04/2012 5:54 PM

## 2012-11-04 NOTE — Op Note (Signed)
[name], [name] NO.:  [id number]  MEDICAL RECORD NO.:  [id number]  LOCATION:  2306                         FACILITY:  [facility]  PHYSICIAN:  Evelene Croon, M.D.     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  OPERATIVE PROCEDURE:  Median sternotomy, extracorporeal circulation, coronary artery bypass graft surgery x4 using a left internal mammary artery graft to left anterior descending coronary artery, with a saphenous vein graft to the obtuse marginal branch of the left circumflex coronary artery, and a sequential saphenous vein graft to the posterior descending and posterolateral branches of the right coronary artery.  Endoscopic vein harvesting from the right leg.  ATTENDING SURGEON:  Evelene Croon, MD  ASSISTANT:  Coral Ceo, PA-C  ANESTHESIA:  General endotracheal.  CLINICAL HISTORY:  This patient is a 72 year old [place] woman who does not speak any English and has a history of congestive heart failure and ischemic cardiomyopathy, status post anterior myocardial infarction in 2005, treated with primary PCI.  She subsequently had an ICD placed. She now presents with progressive exertional intolerance and intermittent chest discomfort that is now occurring with walking around her house.  She underwent a nuclear adenosine stress test which showed infarction in the distribution of the LAD and possibly the right coronary arteries with modest peri-infarct ischemia and severe left ventricular dysfunction with ejection fraction of 31%.  Cardiac catheterization showed normal right heart pressures.  Cardiac index 3.0. Coronary  angiography showed a 95% heavily calcified mid left main stenosis.  The LAD was a small-caliber vessel throughout that had 80% tandem lesions in the proximal portion.  The distal vessel was small and diffusely diseased.  The left circumflex was small to moderate-sized vessel.  There was a small intermediate and 2 moderate caliber obtuse marginal branches without significant stenosis.  These all appeared to communicate with each other.  The right coronary artery was a large dominant vessel with moderate diffuse calcification.  There is a proximal 70% stenosis.  The distal right coronary artery just before the bifurcation into the posterior descending and posterolateral branches had about 50% stenosis.  Left ventricular ejection fraction is estimated at 35% with akinesis of the anterior apical wall.  After review of the catheterization and examination of the patient, it was felt that coronary artery bypass graft surgery was the best treatment to prevent further ischemia and infarction and improve her quality of life.  I discussed the operative procedure with the patient and her son through an interpreter including alternatives, benefits, and risks including, but not limited to bleeding, blood transfusion, infection, stroke, myocardial infarction, graft failure, and death.  She understood and agreed to proceed.  OPERATIVE PROCEDURE:  The patient was seen in the preoperative holding area.  Her internal defibrillator was turned off by the [employer] representative.  She was given preoperative intravenous antibiotics and taken back to the operating room.  She was placed on table  in supine position and after induction of general endotracheal anesthesia, a Foley catheter was placed in bladder using sterile technique.  Then, the chest, abdomen, and both lower extremities were prepped and draped in usual sterile manner.  TEE was performed by Dr. Burna Forts.  This showed severe left  ventricular dysfunction with ejection fraction of probably 25%.  There was akinesis of the anterior wall and apex as well as the distal inferior wall.  The lateral wall is severely hypokinetic. There was 1 to 2+ mitral regurgitation.  Her pulmonary artery pressures were low normal.  CVP was low normal.  Right heart function appeared normal.  There was no aortic stenosis or insufficiency.  Then, the chest was entered through a median sternotomy incision.  The pericardium opened midline.  Examination of the heart showed good right ventricular contractility.  The ascending aorta was of normal size and had no palpable plaques in it.  Then, the left internal mammary artery was harvested from chest wall as a pedicle graft.  This was a small caliber vessel with excellent blood flow through it.  At the same time, a segment of greater saphenous vein was harvested from the right leg using endoscopic vein harvest technique.  This vein was of small to medium caliber and good quality. It appeared to match the size of her coronary as well.  Then, the patient was heparinized when an adequate ACT was obtained. Distal ascending aorta was cannulated using a 20-French aortic cannula for arterial inflow.  Venous outflow was achieved using a 2-stage venous cannula for the right atrial appendage.  An antegrade cardioplegia and vent cannula was inserted in the aortic root.  The patient was placed on cardiopulmonary bypass and distal coronaries identified.  The LAD was a medium-sized vessel in its midportion.  There was evidence of extensive anterior apical infarction with scar present throughout the anterior wall with thinning of the wall.  The first marginal branch was small vessel but felt to be graftable.  The second marginal branch was a tiny vessel where I could see it and was not felt to be graftable.  The right coronary artery was a large dominant vessel. There was a large posterior descending and  posterolateral branch, both of which were graftable.  Then, the aorta was crossclamped and 1000 mL of cold blood antegrade cardioplegia was administered in the aortic root with quick arrest of the heart.  Systemic hypothermia to 32 degrees centigrade and topical hypothermia with iced saline was used.  A temperature probe was placed in septum insulating pad in the pericardium.  The first distal anastomosis was performed to the posterior descending coronary artery.  The internal diameter was 1.75 mm.  The conduit used was a segment of greater saphenous vein.  Anastomosis performed in sequential side-to-side manner using continuous 7-0 Prolene suture. Flow was noted through the graft and was excellent.  The second distal anastomosis was performed to the posterolateral branch.  The internal diameter of this vessel was also 1.75 mm.  The conduit used was the same segment of greater saphenous vein and anastomosis performed in a sequential end-to-side manner using continuous 7-0 suture.  Flow was noted through the graft and was excellent.  Then, another dose of cardioplegia was given down the vein graft and aortic root.  The third distal anastomosis was performed to the first marginal branch. The internal diameter of this vessel was about 1.2 mm.  The conduit used was a second segment of greater saphenous vein and  the anastomosis performed in a end-to-side manner using continuous 8-0 Prolene suture. Flow was noted through this graft and was good.  The fourth distal anastomosis was performed in the mid LAD.  The internal diameter was about 1.6 mm.  The conduit used was the left internal mammary graft and this was brought through an opening in the left pericardium anterior to the phrenic nerve.  It was anastomosed to LAD in end-to-side manner using continuous 8-0 Prolene suture.  The pedicle was sutured to the epicardium with 6-0 Prolene sutures.  The patient rewarmed to 37 degrees  centigrade.  With crossclamp in place, the 2 proximal vein graft anastomoses were performed in mid ascending aorta in end-to-side manner using continuous 6-0 Prolene suture.  Then, the clamp was removed from the mammary pedicle.  There was rapid warming of the ventricular septum.  The cross-clamp was removed with a time of 72 minutes.  There was spontaneous return of sinus rhythm.  The proximal and distal anastomoses appeared hemostatic and allowed the grafts satisfactory.  Graft marker was placed around the proximal anastomoses. Two temporary right ventricular and right atrial pacing wires placed and brought through the skin.  When the patient rewarmed to 37 degrees centigrade, she was weaned from cardiopulmonary bypass on dopamine at 3 mcg/kg per minute.  Shortly after weaning from bypass, the pulmonary artery pressures suddenly increased with PAD of about 30 and systolic blood pressure was running about 95-100.  Cardiac index was initially about 1.0.  Right heart function appeared normal.  We turned the dopamine up to about 5 and added low-dose milrinone and over the next 10 minutes, the pulmonary artery pressures continued to decrease to a PAD of about 16 and left heart function appeared somewhat improved and cardiac index increased to 2.2.  I suspect there was probably some transient residual ischemia or the heart was not fully reperfused quite yet when we came off bypass. After starting milrinone and turning up dopamine, hemodynamics remained stable with low pulmonary artery pressure and CVP and good cardiac index.  Protamine was given and the venous and aortic cannulas were removed without difficulty.  Hemostasis was achieved without difficulty. Three chest tubes were placed with 2 in the posterior pericardium, 1 in the left pleural space, and 1 in the mediastinum.  The sternum was closed with #6 stainless steel wires.  Fascia was closed with continuous #1 Vicryl suture.   Subcutaneous tissue was closed with continuous 2-0 Vicryl and the skin with a 3-0 Vicryl subcuticular closure.  The lower extremity vein harvest site was closed in layers in similar manner.  The sponge, needle, and instrument counts were correct according to the scrub nurse.  Dry sterile dressing was applied over the incisions around the chest tubes, which were hooked to Pleur-Evac suction.  The patient remained hemodynamically stable and was transported to the SICU in guarded, but stable condition.     Evelene Croon, M.D.     BB/MEDQ  D:  11/03/2012  T:  11/04/2012  Job:  [id number]

## 2012-11-05 ENCOUNTER — Inpatient Hospital Stay (HOSPITAL_COMMUNITY): Payer: Medicare Other

## 2012-11-05 LAB — CBC
HCT: 28.2 % — ABNORMAL LOW (ref 36.0–46.0)
Hemoglobin: 9.2 g/dL — ABNORMAL LOW (ref 12.0–15.0)
MCH: 26.8 pg (ref 26.0–34.0)
MCHC: 32.6 g/dL (ref 30.0–36.0)
MCV: 82.2 fL (ref 78.0–100.0)
Platelets: 91 10*3/uL — ABNORMAL LOW (ref 150–400)
RBC: 3.43 MIL/uL — ABNORMAL LOW (ref 3.87–5.11)
RDW: 16.9 % — ABNORMAL HIGH (ref 11.5–15.5)
WBC: 7.5 10*3/uL (ref 4.0–10.5)

## 2012-11-05 LAB — BASIC METABOLIC PANEL
BUN: 14 mg/dL (ref 6–23)
CO2: 27 mEq/L (ref 19–32)
Calcium: 8.1 mg/dL — ABNORMAL LOW (ref 8.4–10.5)
Chloride: 102 mEq/L (ref 96–112)
Creatinine, Ser: 0.81 mg/dL (ref 0.50–1.10)
GFR calc Af Amer: 84 mL/min — ABNORMAL LOW (ref >90)
GFR calc non Af Amer: 72 mL/min — ABNORMAL LOW (ref >90)
Glucose, Bld: 92 mg/dL (ref 70–99)
Potassium: 4.2 mEq/L (ref 3.5–5.1)
Sodium: 136 mEq/L (ref 135–145)

## 2012-11-05 LAB — GLUCOSE, CAPILLARY
Glucose-Capillary: 99 mg/dL (ref 70–99)
Glucose-Capillary: 119 mg/dL — ABNORMAL HIGH (ref 70–99)
Glucose-Capillary: 170 mg/dL — ABNORMAL HIGH (ref 70–99)

## 2012-11-05 MED ORDER — ENOXAPARIN SODIUM 30 MG/0.3ML ~~LOC~~ SOLN
20.0000 mg | SUBCUTANEOUS | Status: DC
  Administered 2012-11-05 – 2012-11-09 (×5): 20 mg via SUBCUTANEOUS
  Filled 2012-11-05 (×9): qty 0.2

## 2012-11-05 MED ORDER — DOPAMINE-DEXTROSE 3.2-5 MG/ML-% IV SOLN: 3.0000 ug/kg/min | INTRAVENOUS | Status: DC

## 2012-11-05 NOTE — Progress Notes (Signed)
Patient ID: [name], female   DOB: [birth year], 72 y.o.   MRN: [id number]                   [address]            [place] [id number]          [phone]     2 Days Post-Op Procedure(s) (LRB): CORONARY ARTERY BYPASS GRAFTING (CABG) (N/A) INTRAOPERATIVE TRANSESOPHAGEAL ECHOCARDIOGRAM (N/A)  Total Length of Stay:  LOS: 6 days  BP 112/55  Pulse 90  Temp 97.9 F (36.6 C) (Oral)  Resp 14  Ht 5\' 2"  (1.575 m)  Wt 128 lb 4.9 oz (58.2 kg)  BMI 23.47 kg/m2  SpO2 100%  .Intake/Output      01/19 0701 - 01/20 0700   P.O. 850   I.V. (mL/kg) 473 (8.1)   IV Piggyback 50   Total Intake(mL/kg) 1373 (23.6)   Urine (mL/kg/hr) 470 (0.7)   Chest Tube    Total Output 470   Net +903            . sodium chloride 20 mL/hr (11/03/12 1300)  . sodium chloride 10 mL/hr at 11/04/12 0100  . lactated ringers 20 mL/hr (11/03/12 1300)  . nitroGLYCERIN Stopped (11/03/12 1800)     Lab Results  Component Value Date   WBC 7.5 11/05/2012   HGB 9.2* 11/05/2012   HCT 28.2* 11/05/2012   PLT 91* 11/05/2012   GLUCOSE 92 11/05/2012   CHOL 197 07/31/2007   TRIG 377* 07/31/2007   HDL 29.0* 07/31/2007   LDLDIRECT 122.2 07/31/2007   ALT 12 02/17/2010   AST 18 02/17/2010   NA 136 11/05/2012   K 4.2 11/05/2012   CL 102 11/05/2012   CREATININE 0.81 11/05/2012   BUN 14 11/05/2012   CO2 27 11/05/2012   TSH 2.14 05/18/2007   INR 1.32 11/03/2012   HGBA1C 6.2* 11/02/2012   Stable day, not able to give coreg or lisinopril weaning dopamine  Delight Ovens MD  Beeper [phone] Office [phone] 11/05/2012 7:13 PM

## 2012-11-05 NOTE — Progress Notes (Signed)
Arterial line with dampened waveform and no blood draw removed.  Pressure held x5 minutes with minimal bleeding.  Site cleansed and bandaged per RT protocol.  RN at bedside.

## 2012-11-05 NOTE — Progress Notes (Signed)
Patient ID: [name], female   DOB: Nov 02, 1940, 72 y.o.   MRN: [id number] [facility]CTS DAILY PROGRESS NOTE                   [address]            [place] [address]          [phone]      2 Days Post-Op Procedure(s) (LRB): CORONARY ARTERY BYPASS GRAFTING (CABG) (N/A) INTRAOPERATIVE TRANSESOPHAGEAL ECHOCARDIOGRAM (N/A)  Total Length of Stay:  LOS: 6 days   Subjective: Up to chair, awake and alert  Objective: Vital signs in last 24 hours: Temp:  [97.5 F (36.4 C)-99.1 F (37.3 C)] 97.5 F (36.4 C) (01/19 0758) Pulse Rate:  [70-94] 90  (01/19 0700) Cardiac Rhythm:  [-] Atrial paced (01/18 2015) Resp:  [9-27] 9  (01/19 0700) BP: (78-116)/(41-60) 113/60 mmHg (01/19 0700) SpO2:  [96 %-100 %] 100 % (01/19 0700) Arterial Line BP: (98-147)/(32-66) 111/66 mmHg (01/19 0200) Weight:  [128 lb 4.9 oz (58.2 kg)] 128 lb 4.9 oz (58.2 kg) (01/19 0600)  Filed Weights   11/02/12 0500 11/03/12 0500 11/05/12 0600  Weight: 118 lb 6.2 oz (53.7 kg) 118 lb 9.7 oz (53.8 kg) 128 lb 4.9 oz (58.2 kg)    Weight change:    Hemodynamic parameters for last 24 hours: PAP: (31-32)/(13-14) 32/13 mmHg  Intake/Output from previous day: 01/18 0701 - 01/19 0700 In: 1691.7 [P.O.:600; I.V.:991.7; IV Piggyback:100] Out: 850 [Urine:800; Chest Tube:50]  Intake/Output this shift:    Current Meds: Scheduled Meds:    . acetaminophen  1,000 mg Oral Q6H   Or  . acetaminophen (TYLENOL) oral liquid 160 mg/5 mL  975 mg Per Tube Q6H  . aspirin EC  325 mg Oral Daily   Or  . aspirin  324 mg Per Tube Daily  . bisacodyl  10 mg Oral Daily   Or  . bisacodyl  10 mg Rectal Daily  . carvedilol  3.125 mg Oral BID WC  . cefUROXime (ZINACEF)  IV  1.5 g Intravenous Q12H  . docusate sodium  200 mg Oral Daily  . furosemide  20 mg Intravenous Once  . gabapentin  100 mg Oral QHS  . insulin aspart  0-24 Units Subcutaneous TID AC & HS  . lisinopril  2.5 mg Oral Daily  . pantoprazole  40 mg Oral  Daily  . simvastatin  20 mg Oral QHS  . sodium chloride  3 mL Intravenous Q12H   Continuous Infusions:    . sodium chloride 20 mL/hr (11/03/12 1300)  . sodium chloride 10 mL/hr at 11/04/12 0100  . dexmedetomidine Stopped (11/03/12 1500)  . DOPamine 3 mcg/kg/min (11/03/12 1900)  . lactated ringers 20 mL/hr (11/03/12 1300)  . nitroGLYCERIN Stopped (11/03/12 1800)  . phenylephrine (NEO-SYNEPHRINE) Adult infusion 5.067 mcg/min (11/03/12 1900)   PRN Meds:.sodium chloride, metoprolol, midazolam, morphine injection, ondansetron (ZOFRAN) IV, oxyCODONE, sodium chloride  General appearance: alert, cooperative and no distress Neurologic: intact Heart: regular rate and rhythm, S1, S2 normal, no murmur, click, rub or gallop and normal apical impulse Lungs: diminished breath sounds bibasilar Abdomen: soft, non-tender; bowel sounds normal; no masses,  no organomegaly Extremities: extremities normal, atraumatic, no cyanosis or edema and Homans sign is negative, no sign of DVT Wound: sternum stable  Lab Results: CBC:  Basename 11/05/12 0448 11/04/12 1605 11/04/12 1600  WBC 7.5 -- 8.7  HGB 9.2* 9.9* --  HCT 28.2* 29.0* --  PLT 91* -- 91*  BMET:   Basename 11/05/12 0448 11/04/12 1605 11/04/12 0400  NA 136 136 --  K 4.2 4.3 --  CL 102 101 --  CO2 27 -- 24  GLUCOSE 92 141* --  BUN 14 10 --  CREATININE 0.81 0.70 --  CALCIUM 8.1* -- 7.9*    PT/INR:   Basename 11/03/12 1305  LABPROT 16.1*  INR 1.32   Radiology: Dg Chest Port 1 View  11/05/2012  *RADIOLOGY REPORT*  Clinical Data: Chest tube removal yesterday.  PORTABLE CHEST - 1 VIEW  Comparison: 11/04/2012  Findings: Stable appearance of postoperative changes in the mediastinum, and cardiac pacemaker.  Swan-Ganz catheter has been removed with CVP line in place with tip over the mid SVC region. Cardiac enlargement.  Linear atelectasis in the left mid lung. Interval removal of chest tubes and mediastinal drains.  Suggestion of small  residual gas collection in the left lung base.  Previous apical pneumothorax is not definitely apparent.  IMPRESSION: Appliances positioned as described with interval removal of mediastinal drains and chest tubes.  Cardiac enlargement with linear atelectasis in the left midlung.  Suggestion of small amount of residual gas either in the pleural space or soft tissues at the left base.   Original Report Authenticated By: Burman Nieves, M.D.    Dg Chest Portable 1 View In Am  11/04/2012  *RADIOLOGY REPORT*  Clinical Data: Chest pain and congestion postop.  PORTABLE CHEST - 1 VIEW  Comparison: 11/03/2012  Findings: Postoperative changes in the mediastinum and cardiac pacemaker are stable.  Left chest tube and two mediastinal drains are in place.  Right Swan-Ganz catheter with tip over the right pulmonary artery.  Mild cardiac enlargement.  Pulmonary vascularity and edema are improving.  Linear atelectasis in the left mid lung and right lung base.  Suggestion of a small residual left apical pneumothorax.  IMPRESSION: Appliances appear in satisfactory position.  Suggestion of small residual left apical pneumothorax with left chest tube in place. Improving pulmonary vascular congestive changes.   Original Report Authenticated By: Burman Nieves, M.D.    Dg Chest Portable 1 View  11/03/2012  *RADIOLOGY REPORT*  Clinical Data: Postop CABG  PORTABLE CHEST - 1 VIEW  Comparison: Chest radiograph 11/02/2012  Findings: Endotracheal tube terminates 3 cm above carina.  Right IJ sheath transmits a Swan-Ganz catheter with the distal tip in the right main pulmonary artery.  A nasogastric tube courses into the stomach and continues below the edge of the image.  The left chest tube terminates near the medial left lung apex.  A second left chest tube terminates at the lateral left lung base.  Mediastinal drain terminates near the midline at the level of the aortic arch. Left chest wall single lead AICD is stable.  Epicardial  pacer wires present.  Changes of median sternotomy for CABG.  There is mild cardiomegaly and mild pulmonary vascular congestion.  Interstitial markings are mildly increased bilaterally.  There is streaky left basilar atelectasis.  No definite pleural effusions visible. Negative for pneumothorax.  No acute osseous abnormality.  IMPRESSION:  1. Stable cardiomegaly status post CABG with mild pulmonary vascular congestion and mild interstitial pulmonary edema. 2.  Satisfactory position of support devices.   Original Report Authenticated By: Britta Mccreedy, M.D.      Assessment/Plan: S/P Procedure(s) (LRB): CORONARY ARTERY BYPASS GRAFTING (CABG) (N/A) INTRAOPERATIVE TRANSESOPHAGEAL ECHOCARDIOGRAM (N/A) Mobilize Diuresis See progression orders Mild thrombocytopenia  preop :PRE-CARDIOPULMONARY BYPASS TEE EXAMINATION:  1. Left ventricle: The left ventricular chamber is seen initially in  the short axis view. It is globally dilated and globally depressed  in its function. 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  The distal 1/3rd of the left ventricular chambers seen in this four-  chamber view is thin walled and akinetic. Overall estimated  ejection fraction is at below 20%. Multiple views are obtained to  analyze this aneurysmal akinetic apical area of the left  ventricular chamber. Did not tolerate adding ace and coreg yesterday, coreg reduced to 3.125, will need ace added as toleratedWean dopamine as tolerated today   [name] B 11/05/2012 8:43 AM

## 2012-11-06 ENCOUNTER — Encounter (HOSPITAL_COMMUNITY): Payer: Self-pay | Admitting: Surgery

## 2012-11-06 ENCOUNTER — Inpatient Hospital Stay (HOSPITAL_COMMUNITY): Payer: Medicare Other

## 2012-11-06 LAB — TYPE AND SCREEN
Unit division: 0
Unit division: 0
Unit division: 0
Unit division: 0

## 2012-11-06 LAB — CBC
HCT: 30.2 % — ABNORMAL LOW (ref 36.0–46.0)
HCT: 34.8 % — ABNORMAL LOW (ref 36.0–46.0)
Hemoglobin: 10 g/dL — ABNORMAL LOW (ref 12.0–15.0)
Hemoglobin: 11.6 g/dL — ABNORMAL LOW (ref 12.0–15.0)
MCH: 27.2 pg (ref 26.0–34.0)
MCHC: 33.1 g/dL (ref 30.0–36.0)
MCV: 82.1 fL (ref 78.0–100.0)
Platelets: 22 10*3/uL — CL (ref 150–400)
RBC: 3.68 MIL/uL — ABNORMAL LOW (ref 3.87–5.11)
RBC: 4.24 MIL/uL (ref 3.87–5.11)
RDW: 16.9 % — ABNORMAL HIGH (ref 11.5–15.5)
RDW: 17.1 % — ABNORMAL HIGH (ref 11.5–15.5)
WBC: 4.6 10*3/uL (ref 4.0–10.5)
WBC: 7.5 10*3/uL (ref 4.0–10.5)

## 2012-11-06 LAB — BASIC METABOLIC PANEL
BUN: 18 mg/dL (ref 6–23)
CO2: 25 mEq/L (ref 19–32)
Calcium: 8.3 mg/dL — ABNORMAL LOW (ref 8.4–10.5)
Chloride: 100 mEq/L (ref 96–112)
Creatinine, Ser: 0.72 mg/dL (ref 0.50–1.10)
GFR calc Af Amer: >90 mL/min (ref >90)
GFR calc non Af Amer: 86 mL/min — ABNORMAL LOW (ref >90)
Glucose, Bld: 99 mg/dL (ref 70–99)
Potassium: 5.1 mEq/L (ref 3.5–5.1)
Sodium: 135 mEq/L (ref 135–145)

## 2012-11-06 LAB — GLUCOSE, CAPILLARY
Glucose-Capillary: 112 mg/dL — ABNORMAL HIGH (ref 70–99)
Glucose-Capillary: 97 mg/dL (ref 70–99)

## 2012-11-06 MED ORDER — BENZONATATE 100 MG PO CAPS
100.0000 mg | ORAL_CAPSULE | Freq: Three times a day (TID) | ORAL | Status: DC | PRN
  Administered 2012-11-10: 100 mg via ORAL
  Filled 2012-11-06 (×3): qty 1

## 2012-11-06 MED ORDER — ENSURE COMPLETE PO LIQD
237.0000 mL | Freq: Two times a day (BID) | ORAL | Status: DC
  Administered 2012-11-06 – 2012-11-09 (×7): 237 mL via ORAL

## 2012-11-06 MED ORDER — POTASSIUM CHLORIDE CRYS ER 20 MEQ PO TBCR
20.0000 meq | EXTENDED_RELEASE_TABLET | Freq: Two times a day (BID) | ORAL | Status: DC
  Administered 2012-11-07 (×2): 20 meq via ORAL
  Filled 2012-11-06 (×4): qty 1

## 2012-11-06 MED ORDER — FUROSEMIDE 20 MG PO TABS
20.0000 mg | ORAL_TABLET | Freq: Two times a day (BID) | ORAL | Status: DC
  Administered 2012-11-06 – 2012-11-08 (×6): 20 mg via ORAL
  Filled 2012-11-06 (×8): qty 1

## 2012-11-06 MED ORDER — HYDROCOD POLST-CHLORPHEN POLST 10-8 MG/5ML PO LQCR
5.0000 mL | Freq: Two times a day (BID) | ORAL | Status: AC
  Administered 2012-11-06 – 2012-11-08 (×6): 5 mL via ORAL
  Filled 2012-11-06 (×5): qty 5

## 2012-11-06 MED FILL — Magnesium Sulfate Inj 50%: INTRAMUSCULAR | Qty: 10 | Status: AC

## 2012-11-06 MED FILL — Heparin Sodium (Porcine) Inj 1000 Unit/ML: INTRAMUSCULAR | Qty: 30 | Status: AC

## 2012-11-06 MED FILL — Heparin Sodium (Porcine) Inj 1000 Unit/ML: INTRAMUSCULAR | Qty: 10 | Status: AC

## 2012-11-06 MED FILL — Electrolyte-R (PH 7.4) Solution: INTRAVENOUS | Qty: 4000 | Status: AC

## 2012-11-06 MED FILL — Sodium Bicarbonate IV Soln 8.4%: INTRAVENOUS | Qty: 50 | Status: AC

## 2012-11-06 MED FILL — Mannitol IV Soln 20%: INTRAVENOUS | Qty: 500 | Status: AC

## 2012-11-06 MED FILL — Sodium Chloride Irrigation Soln 0.9%: Qty: 3000 | Status: AC

## 2012-11-06 MED FILL — Sodium Chloride IV Soln 0.9%: INTRAVENOUS | Qty: 1000 | Status: AC

## 2012-11-06 MED FILL — Lidocaine HCl IV Inj 20 MG/ML: INTRAVENOUS | Qty: 5 | Status: AC

## 2012-11-06 MED FILL — Potassium Chloride Inj 2 mEq/ML: INTRAVENOUS | Qty: 40 | Status: AC

## 2012-11-06 NOTE — Anesthesia Postprocedure Evaluation (Signed)
  Anesthesia Post-op Note  Patient: [name]  Procedure(s) Performed: Procedure(s) (LRB) with comments: CORONARY ARTERY BYPASS GRAFTING (CABG) (N/A) INTRAOPERATIVE TRANSESOPHAGEAL ECHOCARDIOGRAM (N/A)  Patient Location: ICU  Anesthesia Type:General  Level of Consciousness: awake, alert , oriented and patient cooperative  Airway and Oxygen Therapy: Patient Spontanous Breathing  Post-op Pain: mild  Post-op Assessment: Post-op Vital signs reviewed, Patient's Cardiovascular Status Stable, Respiratory Function Stable, Patent Airway, No signs of Nausea or vomiting, Adequate PO intake and Pain level controlled  Post-op Vital Signs: Reviewed and stable  Complications: No apparent anesthesia complications

## 2012-11-06 NOTE — Progress Notes (Signed)
NUTRITION FOLLOW UP  Intervention:    Re-order Ensure Complete twice daily (350 kcals, 13 gm protein per 8 fl oz bottle)  Continue snacks TID RD to follow for nutrition care plan  New Nutrition Dx:   Increased nutrient needs related to post-op healing as evidenced by estimated nutrition needs  Goal:   Oral intake with meals & supplements to meet >/= 90% of estimated nutrition needs, unmet  Monitor:   PO & supplemental intake, weight, labs, I/O's  Assessment:   Patient s/p procedures 1/18: CORONARY ARTERY BYPASS GRAFTING (CABG)  INTRAOPERATIVE TRANSESOPHAGEAL ECHOCARDIOGRAM    Patient reports her appetite is doing OK; PO intake poor at 0-20% per flowsheet records; nodded "yes" when asked if she's drinking oral supplements.  Noted Dulcolax added for consipation 1/18.  Height: Ht Readings from Last 1 Encounters:  10/30/12 5\' 2"  (1.575 m)    Weight Status:   Wt Readings from Last 1 Encounters:  11/06/12 130 lb 1.1 oz (59 kg)    Re-estimated needs:  Kcal: 1500-1700 Protein: 70-80 gm Fluid: 1.5-1.7 L  Skin: chest & leg surgical incisions  Diet Order: Carb Control   Intake/Output Summary (Last 24 hours) at 11/06/12 1346 Last data filed at 11/06/12 0900  Gross per 24 hour  Intake   1022 ml  Output   2550 ml  Net  -1528 ml    Labs:   Lab 11/06/12 0500 11/05/12 0448 11/04/12 1605 11/04/12 1600 11/04/12 0400 11/03/12 1815  NA 135 136 136 -- -- --  K 5.1 4.2 4.3 -- -- --  CL 100 102 101 -- -- --  CO2 25 27 -- -- 24 --  BUN 18 14 10  -- -- --  CREATININE 0.72 0.81 0.70 -- -- --  CALCIUM 8.3* 8.1* -- -- 7.9* --  MG -- -- -- 2.5 2.7* 4.1*  PHOS -- -- -- -- -- --  GLUCOSE 99 92 141* -- -- --    CBG (last 3)   Basename 11/06/12 1130 11/06/12 0805 11/05/12 2244  GLUCAP 97 165* 170*    Scheduled Meds:   . acetaminophen  1,000 mg Oral Q6H   Or  . acetaminophen (TYLENOL) oral liquid 160 mg/5 mL  975 mg Per Tube Q6H  . aspirin EC  325 mg Oral Daily   Or  .  aspirin  324 mg Per Tube Daily  . bisacodyl  10 mg Oral Daily   Or  . bisacodyl  10 mg Rectal Daily  . carvedilol  3.125 mg Oral BID WC  . docusate sodium  200 mg Oral Daily  . enoxaparin  20 mg Subcutaneous Q24H  . furosemide  20 mg Oral BID  . gabapentin  100 mg Oral QHS  . insulin aspart  0-24 Units Subcutaneous TID AC & HS  . lisinopril  2.5 mg Oral Daily  . pantoprazole  40 mg Oral Daily  . potassium chloride  20 mEq Oral BID  . simvastatin  20 mg Oral QHS  . sodium chloride  3 mL Intravenous Q12H    Continuous Infusions:   . sodium chloride 20 mL/hr (11/03/12 1300)  . sodium chloride 10 mL/hr at 11/04/12 0100  . lactated ringers 20 mL/hr at 11/06/12 0700    Maureen Chatters, RD, LDN Pager #: [phone] After-Hours Pager #: [phone]

## 2012-11-06 NOTE — Progress Notes (Signed)
Still c/o cough  BP 110/53  Pulse 80  Temp 98.7 F (37.1 C) (Oral)  Resp 21  Ht 5\' 2"  (1.575 m)  Wt 130 lb 1.1 oz (59 kg)  BMI 23.79 kg/m2  SpO2 100%   Intake/Output Summary (Last 24 hours) at 11/06/12 1812 Last data filed at 11/06/12 1800  Gross per 24 hour  Intake   1217 ml  Output   3400 ml  Net  -2183 ml    Repeat PLT count was 120

## 2012-11-06 NOTE — Progress Notes (Addendum)
3 Days Post-Op Procedure(s) (LRB): CORONARY ARTERY BYPASS GRAFTING (CABG) (N/A) INTRAOPERATIVE TRANSESOPHAGEAL ECHOCARDIOGRAM (N/A) Subjective:  [name] complains of cough this morning.  No BM  Objective: Vital signs in last 24 hours: Temp:  [97.5 F (36.4 C)-98.6 F (37 C)] 98 F (36.7 C) (01/20 0400) Pulse Rate:  [78-92] 91  (01/20 0700) Cardiac Rhythm:  [-] Normal sinus rhythm (01/19 2000) Resp:  [9-28] 21  (01/20 0700) BP: (87-121)/(43-65) 104/46 mmHg (01/20 0700) SpO2:  [95 %-100 %] 100 % (01/20 0700) Weight:  [130 lb 1.1 oz (59 kg)] 130 lb 1.1 oz (59 kg) (01/20 0500)  Intake/Output from previous day: 01/19 0701 - 01/20 0700 In: 1890 [P.O.:1324; I.V.:516; IV Piggyback:50] Out: 1970 [Urine:1970]  General appearance: alert, cooperative and no distress Heart: regular rate and rhythm Lungs: diminished breath sounds left base Abdomen: soft, non-tender; bowel sounds normal; no masses,  no organomegaly Extremities: edema 1+ Wound: clean and dry  Lab Results:  Basename 11/06/12 0500 11/05/12 0448  WBC 4.6 7.5  HGB 10.0* 9.2*  HCT 30.2* 28.2*  PLT 22* 91*   BMET:  Basename 11/06/12 0500 11/05/12 0448  NA 135 136  K 5.1 4.2  CL 100 102  CO2 25 27  GLUCOSE 99 92  BUN 18 14  CREATININE 0.72 0.81  CALCIUM 8.3* 8.1*    PT/INR:  Basename 11/03/12 1305  LABPROT 16.1*  INR 1.32   ABG    Component Value Date/Time   PHART 7.384 11/03/2012 1821   HCO3 24.9* 11/03/2012 1821   TCO2 24 11/04/2012 1605   ACIDBASEDEF 1.0 11/03/2012 1305   O2SAT 98.0 11/03/2012 1821   CBG (last 3)   Basename 11/05/12 2244 11/05/12 1639 11/05/12 1212  GLUCAP 170* 119* 136*    Assessment/Plan: S/P Procedure(s) (LRB): CORONARY ARTERY BYPASS GRAFTING (CABG) (N/A) INTRAOPERATIVE TRANSESOPHAGEAL ECHOCARDIOGRAM (N/A)  1. CV- NSR off Dopamine, pressure controlled, rate tachy, tolerating low dose Coreg and Lisinopril 2. Pulm- small left pleural effusion/atelectasis- wean oxygen as  tolerated, encouraged IS use 3. Renal- creatinine normal, good U/O 4. Volume Overload- weight is up, + LE edema, will start Lasix 5. Hyperkalemia- not currently on supplementation, will repeat in AM, with addition of Lasix will likely need supplementation 6. Thrombocytopenia- concerned about sample being clotted, repeat Platelet count in process 7. Dispo- patient doing well, will start diuresis, ? Transfer to step down?   LOS: 7 days    BARRETT, ERIN 11/06/2012  Repeat cbc,plt count done Lab Results  Component Value Date   WBC 7.5 11/06/2012   HGB 11.6* 11/06/2012   HCT 34.8* 11/06/2012   MCV 82.1 11/06/2012   PLT 120* 11/06/2012   PRE-CARDIOPULMONARY BYPASS TEE EXAMINATION:  1. Left ventricle: The left ventricular chamber is seen initially in  the short axis view. It is globally dilated and globally depressed  in its function. 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  The distal 1/3rd of the left ventricular chambers seen in this four-  chamber view is thin walled and akinetic. Overall estimated  ejection fraction is at below 20%. Multiple views are obtained to  analyze this aneurysmal akinetic apical area of the left  ventricular chamber.  I have seen and examined [name] and agree with the above assessment  and plan.  Delight Ovens MD Beeper [phone] Office [phone] 11/06/2012 11:16 AM

## 2012-11-07 DIAGNOSIS — Z951 Presence of aortocoronary bypass graft: Secondary | ICD-10-CM

## 2012-11-07 LAB — BASIC METABOLIC PANEL
BUN: 20 mg/dL (ref 6–23)
GFR calc Af Amer: 84 mL/min — ABNORMAL LOW (ref >90)
GFR calc non Af Amer: 72 mL/min — ABNORMAL LOW (ref >90)
Potassium: 4.7 mEq/L (ref 3.5–5.1)

## 2012-11-07 LAB — GLUCOSE, CAPILLARY: Glucose-Capillary: 124 mg/dL — ABNORMAL HIGH (ref 70–99)

## 2012-11-07 MED ORDER — ASPIRIN 325 MG PO TBEC: 325.0000 mg | DELAYED_RELEASE_TABLET | Freq: Every day | ORAL | Status: DC

## 2012-11-07 MED ORDER — METFORMIN HCL 500 MG PO TABS: 500.0000 mg | ORAL_TABLET | Freq: Two times a day (BID) | ORAL | Status: DC

## 2012-11-07 MED ORDER — SODIUM CHLORIDE 0.9 % IV SOLN: 250.0000 mL | INTRAVENOUS | Status: DC | PRN

## 2012-11-07 MED ORDER — FUROSEMIDE 20 MG PO TABS: 20.0000 mg | ORAL_TABLET | Freq: Two times a day (BID) | ORAL | Status: DC

## 2012-11-07 MED ORDER — MOVING RIGHT ALONG BOOK
Freq: Once | Status: AC
  Administered 2012-11-07: 21:00:00
  Filled 2012-11-07: qty 1

## 2012-11-07 MED ORDER — FREESTYLE SYSTEM KIT: 1.0000 | PACK | Status: DC | PRN

## 2012-11-07 MED ORDER — POTASSIUM CHLORIDE CRYS ER 20 MEQ PO TBCR: 20.0000 meq | EXTENDED_RELEASE_TABLET | Freq: Two times a day (BID) | ORAL | Status: DC

## 2012-11-07 MED ORDER — METFORMIN HCL 500 MG PO TABS
500.0000 mg | ORAL_TABLET | Freq: Two times a day (BID) | ORAL | Status: DC
  Administered 2012-11-07 – 2012-11-10 (×6): 500 mg via ORAL
  Filled 2012-11-07 (×9): qty 1

## 2012-11-07 MED ORDER — SODIUM CHLORIDE 0.9 % IJ SOLN: 3.0000 mL | INTRAMUSCULAR | Status: DC | PRN

## 2012-11-07 MED ORDER — GLUCOSE BLOOD VI STRP: ORAL_STRIP | Status: DC

## 2012-11-07 MED ORDER — FREESTYLE LANCETS MISC: Status: DC

## 2012-11-07 MED ORDER — SODIUM CHLORIDE 0.9 % IJ SOLN
3.0000 mL | Freq: Two times a day (BID) | INTRAMUSCULAR | Status: DC
  Administered 2012-11-09: 3 mL via INTRAVENOUS

## 2012-11-07 MED ORDER — OXYCODONE HCL 5 MG PO TABS: 5.0000 mg | ORAL_TABLET | ORAL | Status: DC | PRN

## 2012-11-07 NOTE — Progress Notes (Addendum)
4 Days Post-Op Procedure(s) (LRB): CORONARY ARTERY BYPASS GRAFTING (CABG) (N/A) INTRAOPERATIVE TRANSESOPHAGEAL ECHOCARDIOGRAM (N/A) Subjective:  [name] does points to her left thigh this morning. + productive cough  She does not speak [place] and no translator is present at bedside.  Objective: Vital signs in last 24 hours: Temp:  [98.3 F (36.8 C)-98.9 F (37.2 C)] 98.7 F (37.1 C) (01/21 0736) Pulse Rate:  [77-97] 97  (01/21 0700) Cardiac Rhythm:  [-] Normal sinus rhythm (01/21 0400) Resp:  [13-23] 22  (01/21 0700) BP: (83-125)/(44-55) 116/55 mmHg (01/21 0700) SpO2:  [95 %-100 %] 97 % (01/21 0700) Weight:  [126 lb 5.2 oz (57.3 kg)] 126 lb 5.2 oz (57.3 kg) (01/21 0500)  Intake/Output from previous day: 01/20 0701 - 01/21 0700 In: 1140 [P.O.:680; I.V.:460] Out: 4250 [Urine:4250]  General appearance: alert, cooperative and no distress Heart: regular rate and rhythm Lungs: clear to auscultation bilaterally Abdomen: soft, non-tender; bowel sounds normal; no masses,  no organomegaly Extremities: edema trace Wound: clean and dry  Lab Results:  Basename 11/06/12 0700 11/06/12 0500  WBC 7.5 4.6  HGB 11.6* 10.0*  HCT 34.8* 30.2*  PLT 120* 22*   BMET:  Basename 11/07/12 0400 11/06/12 0500  NA 137 135  K 4.7 5.1  CL 99 100  CO2 30 25  GLUCOSE 83 99  BUN 20 18  CREATININE 0.81 0.72  CALCIUM 8.7 8.3*    PT/INR: No results found for this basename: LABPROT,INR in the last 72 hours ABG    Component Value Date/Time   PHART 7.384 11/03/2012 1821   HCO3 24.9* 11/03/2012 1821   TCO2 24 11/04/2012 1605   ACIDBASEDEF 1.0 11/03/2012 1305   O2SAT 98.0 11/03/2012 1821   CBG (last 3)   Basename 11/06/12 2119 11/06/12 1757 11/06/12 1130  GLUCAP 140* 91 97    Assessment/Plan: S/P Procedure(s) (LRB): CORONARY ARTERY BYPASS GRAFTING (CABG) (N/A) INTRAOPERATIVE TRANSESOPHAGEAL ECHOCARDIOGRAM (N/A)  1. CV- NSR rate and pressure controlled, on Coreg and Lisinopril 2. Pulm- +  Productive cough on tussinex, continue IS 3. Volume overload- weight is up, continue Lasix 4. Hyperkalemia improved- will start supplementation today, since on Lasix 5. DM-A1c is 6.2 good control with diet, sugars running 90s-150s, will start Metformin 500mg  BID 6. Dispo- patient doing well, will plan to transfer to step down unit possibly d/c home in next 24-48 hours  LOS: 8 days    [name] 11/07/2012     Chart reviewed, patient examined, agree with above. Transfer to 2000

## 2012-11-07 NOTE — Progress Notes (Signed)
Inpatient Diabetes Program Recommendations  AACE/ADA: New Consensus Statement on Inpatient Glycemic Control (2013)  Target Ranges:  Prepandial:   less than 140 mg/dL      Peak postprandial:   less than 180 mg/dL (1-2 hours)      Critically ill patients:  140 - 180 mg/dL   Reason for Visit: MD Consult   Note: Hgb A1C is 6.2 with no history of diabetes-- indicative of probable Pre-diabetes.  Patient does not speak [place]. Met with son who speaks fluent [place].  Discussed with son the implications of an elevated Hgb A1C and need to improve value instead of patient developing full-blown diabetes.  Stressed need for close follow-up with PCP to monitor glycemic status on a regular basis.  Note that patient is being started on Metformin-- which should be beneficial.  Placed consult for dietitian to see.  Patient is vegetarian.    Son declined a booklet about diabetes, but advised that he watch select videos about diabetes on the Patient Education Network so that he would have some idea with lifestyle involved in caring for diabetes.   Gave son written information regarding meal planning/diabetes education available at no charge at [facility].  Encouraged son to have patient participate in Outpatient Cardiac Rehab Program so she can establish an exercise program.  Thank you for the consult. Zaineb Nowaczyk S. Elsie Lincoln, RN, CNS, CDE Inpatient Diabetes Program, team pager [phone]

## 2012-11-07 NOTE — Plan of Care (Signed)
Problem: Phase III Progression Outcomes Goal: Time patient transferred to PCTU/Telemetry POD Outcome: Completed/Met Date Met:  11/07/12 1152

## 2012-11-07 NOTE — Progress Notes (Signed)
caCARDIAC REHAB PHASE I   PRE:  Rate/Rhythm: 92 SR  BP:  Supine: 90/50  Sitting:   Standing:    SaO2: 93 RA  MODE:  Ambulation: 500 ft   POST:  Rate/Rhythem: 107  BP:  Supine:   Sitting: 100/60  Standing:    SaO2: 98 RA 1415-1500 Assisted X 1 to ambulate, gait steady. VS stable. Pt to recliner after walk without c/o. Son present in room and call light in reach.  [name]

## 2012-11-07 NOTE — Discharge Summary (Signed)
Physician Discharge Summary  Patient ID: [name] MRN: [id number] DOB/AGE: [birth year] 72 y.o.  Admit date: 10/30/2012 Discharge date: 11/07/2012  Admission Diagnoses:  Patient Active Problem List  Diagnosis  . CARDIOMYOPATHY, ISCHEMIC  . SVT/ PSVT/ PAT  . [phone] lead  . Automatic implantable cardiac defibrillator Medtronic  . Shoulder pain, bilateral  . Chronic systolic heart failure  . Sinus bradycardia  . Dyspnea on exertion  . Exertional angina   Discharge Diagnoses:   Patient Active Problem List  Diagnosis  . CARDIOMYOPATHY, ISCHEMIC  . SVT/ PSVT/ PAT  . [phone] lead  . Automatic implantable cardiac defibrillator Medtronic  . Shoulder pain, bilateral  . Chronic systolic heart failure  . Sinus bradycardia  . Dyspnea on exertion  . Exertional angina  . S/P CABG x 4   Discharged Condition: good  Note: patients son provided medical history as patient does not speak [place]  History of Present Illness:   [name] is a 72 yo [place] Female who does not speak [place].  She has a known history of CHFand Ischemic Cardiomyopathy S/P ICD implantation.  The patient has been experiencing a progressive exercise intolerance.  She also has been experiencing intermittent chest discomfort just walking around her house.  She underwent an Adenosine stress test which showed infarction in the LAD distribution and modest infarct ischemia in the area of the right coronary arteries.  EF was also reduced at 31%.  Due to these findings cardiac catheterization was performed and showed severe vessel CAD with LM involvement.  Due to these findings it was felt the patient would benefit from admission with coronary bypass consult.    Hospital Course:   After admission the patient was placed on IV Heparin.  TCTS consult was placed and the patient was evaluated by Dr. Laneta Simmers who felt she would benefit from Coronary bypass procedure with the goal of improved EF with better coronary blood flow.  The  risks and benefits were explained to the patient and her family and she was agreeable to proceed.  On 11/03/2012 the patient was taken to the operating room and underwent CABG x 4 utilizing LIMA to LAD, SVG to OM1, and a Sequential SVG to PD and PLB.  She also underwent endoscopic saphenous vein harvest of the right leg.  The patient tolerated the procedure the well and was taken to the SICU in stable condition.  The patient was extubated the evening of surgery.  During his ICU course the patient was weaned off Dopamine and Milrinone.  Her chest tubes and arterial lines were removed without difficulty.  She was restarted on her home Coreg.  She is maintaining NSR and was transferred to the telemetry unit for further convalescence.  The patient continues to do well.  Her pacing wires were removed without difficulty.  She was started on low dose Metformin with a preop A1c of 6.2.  Diabetes education has been ordered.  If the patient continues to make progress and no further issues arise we will plan for discharge home in the next 24-48 hours.  She will follow up with Dr. Laneta Simmers in 3 weeks.  She will need to make a follow up appointment with Dr. Excell Seltzer.  Finally she will need to follow up with her PCP for diabetes management.    Consults: cardiology  Significant Diagnostic Studies: angiography:   Hemodynamics  RA 2  RV 23/2  PA 25/10 mean 16  PCWP 6  LV 129/10  AO 129/57  Oxygen saturations:  PA 63  AO 91  Cardiac Output (Fick) 4.7  Cardiac Index (Fick) 3.0  Coronary angiography:  Coronary dominance: right  Left mainstem: Heavily calcified, mid shaft left main 95% stenosis  Left anterior descending (LAD): Small caliber vessel throughout. The proximal vessel has severe stenosis with 80% tandem lesions present. The mid and distal vessels are small and diffusely diseased.  Left circumflex (LCx): The LCx is small to moderate in caliber. There is a small intermediate and 2 moderate caliber OM branches  without significant stenoses.  Right coronary artery (RCA): Large, dominant vessel with moderate diffuse calcification. The proximal vessel has 70% stenosis with segmental disease throughout the mid-vessel. The distal RCA just before it bifurcates into the PDA and PLA branches has 50% stenosis. The PDA and PLA branches are patent.  Left ventriculography: The anteroapical walls are akinetic. The basal anterior and basal inferior walls contract normally. The LVEF is estimated at 35%.    Treatments: surgery:   Median sternotomy, extracorporeal circulation,  coronary artery bypass graft surgery x4 using a left internal mammary  artery graft to left anterior descending coronary artery, with a  saphenous vein graft to the obtuse marginal branch of the left  circumflex coronary artery, and a sequential saphenous vein graft to the  posterior descending and posterolateral branches of the right coronary  artery. Endoscopic vein harvesting from the right leg.  Disposition: Home  The patient has been discharged on:   1.Beta Blocker:  Yes [ x  ]                              No   [   ]                              If No, reason:  2.Ace Inhibitor/ARB: Yes [ x  ]                                     No  [    ]                                     If No, reason:  3.Statin:   Yes [ x  ]                  No  [   ]                  If No, reason:  4.Ecasa:  Yes  [ x  ]                  No   [   ]                  If No, reason:       Medication List     As of 11/07/2012  9:12 AM    TAKE these medications         aspirin 325 MG EC tablet   Take 1 tablet (325 mg total) by mouth daily.      carvedilol 3.125 MG tablet   Commonly known as: COREG   Take 1 tablet (3.125 mg total) by mouth 2 (two) times daily with a meal.  clopidogrel 75 MG tablet   Commonly known as: PLAVIX   Take 1 tablet (75 mg total) by mouth daily.      diclofenac sodium 1 % Gel   Commonly known as: VOLTAREN    Apply 1 application topically.      freestyle lancets   Check blood sugar TID AC and QHS      furosemide 20 MG tablet   Commonly known as: LASIX   Take 1 tablet (20 mg total) by mouth 2 (two) times daily. For 5 Days      gabapentin 100 MG capsule   Commonly known as: NEURONTIN   Take 100 mg by mouth at bedtime.      glucose blood test strip   Use as instructed      glucose monitoring kit monitoring kit   1 each by Does not apply route as needed for other.      lisinopril 2.5 MG tablet   Commonly known as: PRINIVIL,ZESTRIL   Take 2.5 mg by mouth daily.      metFORMIN 500 MG tablet   Commonly known as: GLUCOPHAGE   Take 1 tablet (500 mg total) by mouth 2 (two) times daily with a meal.      omeprazole 20 MG capsule   Commonly known as: PRILOSEC   Take 20 mg by mouth 2 (two) times daily.      oxyCODONE 5 MG immediate release tablet   Commonly known as: Oxy IR/ROXICODONE   Take 1-2 tablets (5-10 mg total) by mouth every 4 (four) hours as needed.      potassium chloride SA 20 MEQ tablet   Commonly known as: K-DUR,KLOR-CON   Take 1 tablet (20 mEq total) by mouth 2 (two) times daily. For 5 Days      simvastatin 20 MG tablet   Commonly known as: ZOCOR   Take 20 mg by mouth at bedtime.        Follow-up Information    Follow up with Alleen Borne, MD.   Contact information:   [address] [place] [address] [phone]       Follow up with [facility].   Contact information:   [place]       Schedule an appointment as soon as possible for a visit with [name], MD. (Please contact office to set up 2-4 week hospital follow up)    Contact information:   [address] [place] [address] [phone]       Follow up with VYAS,DHRUV B., MD. In 2 weeks. (Please set up followup appointmen for new onset diabetes)    Contact information:   [address] [place] [address] [phone]          Signed: Lowella Dandy 11/07/2012, 9:12 AM

## 2012-11-08 LAB — CBC
MCH: 26.7 pg (ref 26.0–34.0)
MCHC: 32.8 g/dL (ref 30.0–36.0)
MCV: 81.2 fL (ref 78.0–100.0)
Platelets: 168 10*3/uL (ref 150–400)
RBC: 4.05 MIL/uL (ref 3.87–5.11)
RDW: 17.7 % — ABNORMAL HIGH (ref 11.5–15.5)

## 2012-11-08 LAB — BASIC METABOLIC PANEL
CO2: 26 mEq/L (ref 19–32)
Calcium: 8.7 mg/dL (ref 8.4–10.5)
Creatinine, Ser: 0.87 mg/dL (ref 0.50–1.10)
GFR calc non Af Amer: 66 mL/min — ABNORMAL LOW (ref >90)
Sodium: 135 mEq/L (ref 135–145)

## 2012-11-08 MED ORDER — CARVEDILOL 6.25 MG PO TABS
6.2500 mg | ORAL_TABLET | Freq: Two times a day (BID) | ORAL | Status: DC
  Administered 2012-11-08 – 2012-11-10 (×2): 6.25 mg via ORAL
  Filled 2012-11-08 (×7): qty 1

## 2012-11-08 NOTE — Progress Notes (Addendum)
Nutrition Education Note  RD consulted for nutrition education regarding diabetes.   Lab Results  Component Value Date   HGBA1C 6.2* 11/02/2012    RD attempted to utilize telephonic interpreter for education, however, patient not very interested; stopped talking into telephone after brief conversation.  Provided "Carbohydrate Counting for Vegetarians with Diabetes" handouts from the Academy of Nutrition and Dietetics ---> left on patient's tray table.  Please re-consult RD as needed.  Maureen Chatters, RD, LDN Pager #: [phone] After-Hours Pager #: [phone]

## 2012-11-08 NOTE — Progress Notes (Signed)
After explaining procedure including bedrest  to pt per interpreter# [id number] EPW removed per protocol without difficulty ends intact, tolerated well. Reinforced bedrest, frequent vital signs per protocol began 106 49  Heart rate 100.

## 2012-11-08 NOTE — Progress Notes (Signed)
CARDIAC REHAB PHASE I   PRE:  Rate/Rhythm:107-110  BP:  Supine:   Sitting: 122/70  Standing:    SaO2: 92-93 on RA inc with deep breathing/PLB  MODE:  Ambulation: 400 ft One standing rest break, returned to room c/o dizzy/wobbley  POST:  Rate/Rhythem: 132  O2 sat 90 RA PLB inc 92  BP:  Supine:   Sitting: 118/72  Standing:    SaO2: 94 RA Hr at rest/chair 107  Ambulation slow x 1 assist. Pt wobbled as the walk progressed with complaint of feeling tired and dizzy.  Returned to room placed in chair, pt felt better.  Offered pt breakfast tray, pt refused "no eat". Offered beverage pt declined.  Call bell in place. No complaints verbalized Arna Medici [phone]

## 2012-11-08 NOTE — Clinical Social Work Note (Signed)
CSW discussed Pt with multidisciplinary team this morning. RN identified language barrier for Pt's teaching. Staff attempting to use interpreter phone with guarded success.  CSW contacted [employer]pre[employer] to arrange for an "in person" interpreter on Thursday around 11am for 2 hours for maximum teaching time.  CSW will f/u with staff once interpreter is confirmed.   Frederico Hamman, LCSW [phone]

## 2012-11-08 NOTE — Progress Notes (Signed)
Pt tolerated ambulation in hallway with 1+assistance and using rollin walker- 500 ft.

## 2012-11-08 NOTE — Progress Notes (Addendum)
                    [address]            [place] [address]          [phone]     5 Days Post-Op Procedure(s) (LRB): CORONARY ARTERY BYPASS GRAFTING (CABG) (N/A) INTRAOPERATIVE TRANSESOPHAGEAL ECHOCARDIOGRAM (N/A)  Subjective: "No eat".  According to RN, she is drinking Ensure, but not eating much.  Walked in hall yesterday.    Objective: Vital signs in last 24 hours: Patient Vitals for the past 24 hrs:  BP Temp Temp src Pulse Resp SpO2 Weight  11/08/12 0357 120/68 mmHg 98.7 F (37.1 C) Oral 111  19  100 % 123 lb (55.792 kg)  11/07/12 2030 99/77 mmHg 99 F (37.2 C) Oral 104  19  99 % -  11/07/12 1627 96/55 mmHg 98.1 F (36.7 C) Oral 96  18  97 % -  11/07/12 1455 90/50 mmHg - - 92  18  93 % -  11/07/12 1100 102/52 mmHg - - 94  17  95 % -  11/07/12 1000 110/82 mmHg - - 101  27  97 % -  11/07/12 0900 99/46 mmHg - - 101  24  97 % -  11/07/12 0800 100/46 mmHg - - 100  22  95 % -   Current Weight  11/08/12 123 lb (55.792 kg)  PRE-OPERATIVE WEIGHT: 53.8 kg    Intake/Output from previous day: 01/21 0701 - 01/22 0700 In: 640 [P.O.:600; I.V.:40] Out: 1200 [Urine:1200]  CBG 146-124-103-90   PHYSICAL EXAM:  Heart: RRR, tachy 110-120s Lungs: Clear Wound: Clean and dry Extremities: Trace LE edema    Lab Results: CBC: Basename 11/08/12 0550 11/06/12 0700  WBC 3.8* 7.5  HGB 10.8* 11.6*  HCT 32.9* 34.8*  PLT 168 120*   BMET:  Basename 11/08/12 0550 11/07/12 0400  NA 135 137  K 5.2* 4.7  CL 98 99  CO2 26 30  GLUCOSE 103* 83  BUN 17 20  CREATININE 0.87 0.81  CALCIUM 8.7 8.7    PT/INR: No results found for this basename: LABPROT,INR in the last 72 hours    Assessment/Plan: S/P Procedure(s) (LRB): CORONARY ARTERY BYPASS GRAFTING (CABG) (N/A) INTRAOPERATIVE TRANSESOPHAGEAL ECHOCARDIOGRAM (N/A)  CV- A little tachy this am, but BPs soft.  Will d/c ACE-I until BPs improved and increase Coreg.  Hyperkalemia- will d/c supplemental K+  and watch.  DM- sugars stable on Metformin.  Vol overload- she does not appear to be significantly overloaded.  Possibly d/c low dose Lasix.  CRPI, pulm toilet.  Hopefully home soon.  Will need to see what her situation is with assistance at home and check for [facility] needs once family arrives as she speaks only broken [place].   LOS: 9 days    COLLINS,GINA H 11/08/2012    Chart reviewed, patient examined, agree with above. She doesn't have any edema so lasix probably unnecessary. Would hold off on ACE I for now with borderline  BP and plan to resume that as an outpatient as BP rises.

## 2012-11-09 LAB — GLUCOSE, CAPILLARY
Glucose-Capillary: 88 mg/dL (ref 70–99)
Glucose-Capillary: 111 mg/dL — ABNORMAL HIGH (ref 70–99)
Glucose-Capillary: 127 mg/dL — ABNORMAL HIGH (ref 70–99)
Glucose-Capillary: 104 mg/dL — ABNORMAL HIGH (ref 70–99)

## 2012-11-09 LAB — BASIC METABOLIC PANEL
BUN: 22 mg/dL (ref 6–23)
CO2: 22 mEq/L (ref 19–32)
Calcium: 8.5 mg/dL (ref 8.4–10.5)
Glucose, Bld: 86 mg/dL (ref 70–99)
Potassium: 4 mEq/L (ref 3.5–5.1)
Sodium: 134 mEq/L — ABNORMAL LOW (ref 135–145)

## 2012-11-09 MED ORDER — HYDROCORTISONE 2.5 % RE CREA
TOPICAL_CREAM | Freq: Two times a day (BID) | RECTAL | Status: DC
  Administered 2012-11-09 – 2012-11-10 (×3): via RECTAL
  Filled 2012-11-09: qty 28.35

## 2012-11-09 NOTE — Progress Notes (Signed)
CARDIAC REHAB PHASE I   PRE:  Rate/Rhythm: 97 SR    BP: sitting 92/40    SaO2: 97 RA  MODE:  Ambulation: 550 ft   POST:  Rate/Rhythm: 108 ST    BP: sitting 94/50     SaO2: 89-93 RA  Pt attempted using RW but poor control. Better without it and supervision. Denies dizziness or SOB. SaO2 89-90 RA after walk. Daughter present. Will f/u for education with translator. [phone]  Harriet Masson CES, ACSM

## 2012-11-09 NOTE — Progress Notes (Addendum)
[address]            [place] [address]          [phone]     6 Days Post-Op  Procedure(s) (LRB): CORONARY ARTERY BYPASS GRAFTING (CABG) (N/A) INTRAOPERATIVE TRANSESOPHAGEAL ECHOCARDIOGRAM (N/A) Subjective: Slowly feeling stronger, C/O back pain, also having cough with white sputum  Objective  Telemetry sinus rhythm   Temp:  [98 F (36.7 C)-98.6 F (37 C)] 98.6 F (37 C) (01/23 0601) Pulse Rate:  [87-112] 87  (01/23 0601) Resp:  [18-19] 19  (01/23 0601) BP: (84-110)/(45-61) 92/61 mmHg (01/23 0601) SpO2:  [93 %-100 %] 93 % (01/23 0601)   Intake/Output Summary (Last 24 hours) at 11/09/12 0844 Last data filed at 11/08/12 1700  Gross per 24 hour  Intake    480 ml  Output      0 ml  Net    480 ml       General appearance: alert, cooperative and no distress Heart: regular rate and rhythm Lungs: mildly dim in basses Abdomen: soft, nontender Extremities: no sig edema Wound: incisions healing well  Lab Results:  Basename 11/09/12 0550 11/08/12 0550  NA 134* 135  K 4.0 5.2*  CL 97 98  CO2 22 26  GLUCOSE 86 103*  BUN 22 17  CREATININE 0.88 0.87  CALCIUM 8.5 8.7  MG -- --  PHOS -- --   No results found for this basename: AST:2,ALT:2,ALKPHOS:2,BILITOT:2,PROT:2,ALBUMIN:2 in the last 72 hours No results found for this basename: LIPASE:2,AMYLASE:2 in the last 72 hours  Basename 11/08/12 0550  WBC 3.8*  NEUTROABS --  HGB 10.8*  HCT 32.9*  MCV 81.2  PLT 168   No results found for this basename: CKTOTAL:4,CKMB:4,TROPONINI:4 in the last 72 hours No components found with this basename: POCBNP:3 No results found for this basename: DDIMER in the last 72 hours No results found for this basename: HGBA1C in the last 72 hours No results found for this basename: CHOL,HDL,LDLCALC,TRIG,CHOLHDL in the last 72 hours No results found for this basename: TSH,T4TOTAL,FREET3,T3FREE,THYROIDAB in the last 72 hours No results found for this  basename: VITAMINB12,FOLATE,FERRITIN,TIBC,IRON,RETICCTPCT in the last 72 hours  Medications: Scheduled    . aspirin EC  325 mg Oral Daily   Or  . aspirin  324 mg Per Tube Daily  . bisacodyl  10 mg Oral Daily   Or  . bisacodyl  10 mg Rectal Daily  . carvedilol  6.25 mg Oral BID WC  . docusate sodium  200 mg Oral Daily  . enoxaparin  20 mg Subcutaneous Q24H  . feeding supplement  237 mL Oral BID BM  . gabapentin  100 mg Oral QHS  . insulin aspart  0-24 Units Subcutaneous TID AC & HS  . metFORMIN  500 mg Oral BID WC  . pantoprazole  40 mg Oral Daily  . simvastatin  20 mg Oral QHS  . sodium chloride  3 mL Intravenous Q12H  . sodium chloride  3 mL Intravenous Q12H     Radiology/Studies:  No results found.  INR: Will add last result for INR, ABG once components are confirmed Will add last 4 CBG results once components are confirmed  Assessment/Plan: S/P Procedure(s) (LRB): CORONARY ARTERY BYPASS GRAFTING (CABG) (N/A) INTRAOPERATIVE TRANSESOPHAGEAL ECHOCARDIOGRAM (N/A)  1 conts to do well, bp low at times but lasix/ace inh stopped 2 cbg's ok control 3 SW to  see re; home needs, poss home soon    LOS: 10 days    GOLD,WAYNE E 1/23/20148:44 AM    I have seen and examined the patient and agree with the assessment and plan as outlined.  Biggest complaint is due to hemorrhoids at this time.  Annusol cream started.  OWEN,CLARENCE H 11/09/2012 2:07 PM

## 2012-11-09 NOTE — Progress Notes (Signed)
Discharge instructions including how to clean surgical incisions, signs and symptoms of infection and monitoring daily weights completed through translator. Daughter at bedside and voiced understanding. Will complete discharge  instructions tomorrow. Dion Saucier

## 2012-11-09 NOTE — Progress Notes (Signed)
Ed completed through Nurse, learning disability, with daughter present. Pt voiced understanding. Not interested in CRPII, sts she will walk at home. Gave papers on diet for reference. Family requests [facility] RN, shower chair, and also a donut pillow for her hemorrhoids which are very painful right now.  Ethelda Chick CES, ACSM

## 2012-11-10 LAB — GLUCOSE, CAPILLARY: Glucose-Capillary: 101 mg/dL — ABNORMAL HIGH (ref 70–99)

## 2012-11-10 MED ORDER — HYDROCORTISONE 2.5 % RE CREA: TOPICAL_CREAM | Freq: Two times a day (BID) | RECTAL | Status: DC

## 2012-11-10 MED ORDER — BENZONATATE 100 MG PO CAPS: 100.0000 mg | ORAL_CAPSULE | Freq: Three times a day (TID) | ORAL | Status: AC | PRN

## 2012-11-10 MED ORDER — CARVEDILOL 6.25 MG PO TABS: 6.2500 mg | ORAL_TABLET | Freq: Two times a day (BID) | ORAL | Status: DC

## 2012-11-10 NOTE — Progress Notes (Signed)
11/10/2012 7:13 AM Pt's daughter with concerns about pt's pacemaker.  Daughter states that it has been "making a noise" and that there has been a recall on the device for about a year.  No ectopy noted on monitor. The daughter wants to make sure the device isn't malfunctioning. Will continue to monitor. Yesennia Hirota, Avie Echevaria , RN

## 2012-11-10 NOTE — Progress Notes (Signed)
                    [address]            [place] [address]          [phone]     7 Days Post-Op Procedure(s) (LRB): CORONARY ARTERY BYPASS GRAFTING (CABG) (N/A) INTRAOPERATIVE TRANSESOPHAGEAL ECHOCARDIOGRAM (N/A)  Subjective: Less hemorrhoid pain since Anusol started.  +productive cough, clear-white sputum.   Objective: Vital signs in last 24 hours: Patient Vitals for the past 24 hrs:  BP Temp Temp src Pulse Resp SpO2 Weight  11/10/12 0500 110/62 mmHg 98.8 F (37.1 C) Oral 93  18  99 % 128 lb 12 oz (58.4 kg)  11/09/12 2045 97/50 mmHg 98.4 F (36.9 C) Oral 91  18  96 % -  11/09/12 1416 - - - - - - 133 lb 6.4 oz (60.51 kg)  11/09/12 1400 93/53 mmHg 99.3 F (37.4 C) Oral 98  18  98 % -   Current Weight  11/10/12 128 lb 12 oz (58.4 kg)  PRE-OPERATIVE WEIGHT: 53.8 kg    Intake/Output from previous day: 01/23 0701 - 01/24 0700 In: 480 [P.O.:480] Out: -   CBGs 127-104-86   PHYSICAL EXAM:  Heart: RRR Lungs: Clear Wound: Clean and dry Extremities: No LE edema    Lab Results: CBC: Basename 11/08/12 0550  WBC 3.8*  HGB 10.8*  HCT 32.9*  PLT 168   BMET:  Basename 11/09/12 0550 11/08/12 0550  NA 134* 135  K 4.0 5.2*  CL 97 98  CO2 22 26  GLUCOSE 86 103*  BUN 22 17  CREATININE 0.88 0.87  CALCIUM 8.5 8.7    PT/INR: No results found for this basename: LABPROT,INR in the last 72 hours    Assessment/Plan: S/P Procedure(s) (LRB): CORONARY ARTERY BYPASS GRAFTING (CABG) (N/A) INTRAOPERATIVE TRANSESOPHAGEAL ECHOCARDIOGRAM (N/A) She is progressing well overall. BPs low normal, but she is tolerating this well.  Eating a little better. Disp- daughter from [place] is here and will stay with her for the next week or so, then some family friends will be assisting.  Will check to see if [facility] arrangements complete. Plan d/c later today if she remains stable.    LOS: 11 days    Issachar Broady H 11/10/2012

## 2012-11-28 ENCOUNTER — Encounter: Payer: Self-pay | Admitting: Gastroenterology

## 2012-11-28 ENCOUNTER — Ambulatory Visit (INDEPENDENT_AMBULATORY_CARE_PROVIDER_SITE_OTHER): Payer: Medicare Other | Admitting: Gastroenterology

## 2012-11-28 VITALS — BP 107/61 | HR 84 | Temp 97.3°F | Ht 60.0 in | Wt 117.0 lb

## 2012-11-28 DIAGNOSIS — K648 Other hemorrhoids: Secondary | ICD-10-CM

## 2012-11-28 MED ORDER — HYDROCORTISONE ACETATE 25 MG RE SUPP: 25.0000 mg | Freq: Two times a day (BID) | RECTAL | Status: DC

## 2012-11-28 NOTE — Patient Instructions (Addendum)
I have sent a prescription for suppositories to your pharmacy. This will help heal the inflammation of the hemorrhoids.   I will be talking with Dr. Darrick Penna about the procedure to take care of the hemorrhoids. We will need to have your heart doctor clear you for this procedure.  We will be in touch shortly.

## 2012-11-28 NOTE — Progress Notes (Signed)
Referring Provider: Ignatius Specking., MD Primary Care Physician:  Ignatius Specking., MD Primary Gastroenterologist: Dr. Darrick Penna   Chief Complaint  Patient presents with  . Hemorrhoids    HPI:   [name] is a pleasant 72 year old female who presents today as a self-referral secondary to hemorrhoids. She actually was recently discharged from the hospital after undergoing a CABG X 4 on 11/03/12 by Dr. Laneta Simmers. Her daughter is present with her today, and a family friend has accompanied them in case of need for further interpretation, as pt is originally from [place]. She does not speak [place], and her daughter has relayed the majority of the information. Apparently, onset of hemorrhoids occurred close to time of surgery. Daughter notes 4 large prolapsed hemorrhoids originally, with improvement since starting cream that was prescribed by PCP. Also notes using castor oil and other home remedies with improvement. They have been doing sitz baths as well and ensuring pt is having a BM daily, avoiding constipation. Daughter notes [name] is just now able to sit comfortably, as previously she had extreme rectal discomfort. Denies any rectal bleeding. No abdominal pain, change in bowel habits, weight loss. Appetite is fair and without change. She has never had a colonoscopy. Both the daughter and [name] would rather not proceed with a colonoscopy at this point in time due to her recent cardiac surgery. They are willing to consider this in the future. [name] and her daughter are very interested in the banding technique to hopefully provide some relief for the patient.   Past Medical History  Diagnosis Date  . PSVT (paroxysmal supraventricular tachycardia)   . Ischemic cardiomyopathy     Anterior MI 2005 with primary PCI  . Automatic implantable cardiac defibrillator in situ   . Chronic systolic heart failure   . 9604 lead 09/06/2011    Past Surgical History  Procedure Laterality Date  . Cardiac  defibrillator placement      [employer]  . Coronary artery bypass graft  11/03/2012    Procedure: CORONARY ARTERY BYPASS GRAFTING (CABG);  Surgeon: Alleen Borne, MD;  Location: [facility] OR;  Service: Open Heart Surgery;  Laterality: N/A;  . Intraoperative transesophageal echocardiogram  11/03/2012    Procedure: INTRAOPERATIVE TRANSESOPHAGEAL ECHOCARDIOGRAM;  Surgeon: Alleen Borne, MD;  Location: [facility] OR;  Service: Open Heart Surgery;  Laterality: N/A;    Current Outpatient Prescriptions  Medication Sig Dispense Refill  . aspirin EC 325 MG EC tablet Take 1 tablet (325 mg total) by mouth daily.  30 tablet  12  . benzonatate (TESSALON) 100 MG capsule Take 1 capsule (100 mg total) by mouth 3 (three) times daily as needed for cough.  20 capsule  0  . carvedilol (COREG) 6.25 MG tablet Take 1 tablet (6.25 mg total) by mouth 2 (two) times daily with a meal.  60 tablet  1  . diclofenac sodium (VOLTAREN) 1 % GEL Apply 1 application topically.        . gabapentin (NEURONTIN) 100 MG capsule Take 100 mg by mouth at bedtime.        [name] Kitchen glucose blood test strip Use as instructed  100 each  12  . glucose monitoring kit (FREESTYLE) monitoring kit 1 each by Does not apply route as needed for other.  1 each  0  . hydrocortisone (ANUSOL-HC) 2.5 % rectal cream Place rectally 2 (two) times daily.  30 g  0  . hydrocortisone-pramoxine (PROCTOFOAM-HC) rectal foam Place 1 applicator rectally 2 (two) times  daily.      . Lancets (FREESTYLE) lancets Check blood sugar TID AC and QHS  100 each  12  . metFORMIN (GLUCOPHAGE) 500 MG tablet Take 500 mg by mouth daily with breakfast.      . omeprazole (PRILOSEC) 20 MG capsule Take 20 mg by mouth 2 (two) times daily.        [name] Kitchen oxyCODONE (OXY IR/ROXICODONE) 5 MG immediate release tablet Take 1-2 tablets (5-10 mg total) by mouth every 4 (four) hours as needed.  50 tablet  0  . polyethylene glycol (MIRALAX / GLYCOLAX) packet Take 17 g by mouth daily.      . simvastatin (ZOCOR) 20 MG tablet  Take 20 mg by mouth at bedtime.        . hydrocortisone (ANUSOL-HC) 25 MG suppository Place 1 suppository (25 mg total) rectally every 12 (twelve) hours. For 7 days  14 suppository  0   No current facility-administered medications for this visit.    Allergies as of 11/28/2012  . (No Known Allergies)    Family History  Problem Relation Age of Onset  . Colon cancer Neg Hx     History   Social History  . Marital Status: Married    Spouse Name: N/A    Number of Children: N/A  . Years of Education: N/A   Social History Main Topics  . Smoking status: Former Games developer  . Smokeless tobacco: None  . Alcohol Use: No  . Drug Use: None  . Sexually Active: None   Other Topics Concern  . None   Social History Narrative  . None    Review of Systems: Gen: SEE HPI CV: Denies chest pain, palpitations, syncope, peripheral edema, and claudication. Resp: Denies dyspnea at rest, cough, wheezing, coughing up blood, and pleurisy. GI: Denies vomiting blood, jaundice, and fecal incontinence.   Denies dysphagia or odynophagia. Derm: Denies rash, itching, dry skin Psych: Denies depression, anxiety, memory loss, confusion. No homicidal or suicidal ideation.  Heme: Denies bruising, bleeding, and enlarged lymph nodes.  Physical Exam: BP 107/61  Pulse 84  Temp(Src) 97.3 F (36.3 C) (Oral)  Ht 5' (1.524 m)  Wt 117 lb (53.071 kg)  BMI 22.85 kg/m2 General:   Alert and oriented. No distress noted. Pleasant and cooperative.  Head:  Normocephalic and atraumatic. Eyes:  Conjuctiva clear without scleral icterus. Mouth:  Oral mucosa pink and moist.  Neck:  Supple, without mass or thyromegaly. Heart:  S1, S2 present without murmurs, rubs, or gallops. Regular rate and rhythm. Abdomen:  +BS, soft, non-tender and non-distended. No rebound or guarding. No HSM or masses noted. Rectal: one moderate-sized prolapsed internal hemorrhoid, easily reducible, no gross blood noted, internal exam without mass  noted, good sphincter tone.  Msk:  Symmetrical without gross deformities. kyphosis Extremities:  Without edema. Neurologic:  Alert and  oriented x4;  grossly normal neurologically. Skin:  Intact without significant lesions or rashes. Cervical Nodes:  No significant cervical adenopathy. Psych:  Alert and cooperative. Normal mood and affect.

## 2012-11-29 ENCOUNTER — Encounter: Payer: Self-pay | Admitting: Gastroenterology

## 2012-11-29 DIAGNOSIS — K648 Other hemorrhoids: Secondary | ICD-10-CM | POA: Insufficient documentation

## 2012-11-29 NOTE — Assessment & Plan Note (Signed)
72 year old pleasant [place] female (non-English speaking) who presents today due to prolapsed hemorrhoids. Her daughter and interpreter are present. She recently underwent a CABGX 4 and is recovering quite nicely. At this point, hemorrhoids have improved with proctofoam and home remedies. Discomfort improving. One moderate-sized prolapsed, non-thrombosed hemorrhoid noted on exam. Easily reducible. Patient has never had a colonoscopy, and this would be highly recommended in the near future. She has no rectal bleeding. Yet, patient and daughter are hesitant to proceed with a colonoscopy now due to her recent extensive cardiac surgery. They are interested, however, in hemorrhoid banding.   I discussed this with Dr. Darrick Penna, and it is feasible to proceed with banding in the near future without a complete colonoscopy prior. However, we would still recommend complete lower GI tract evaluation later. We will contact patient with this plan, proceed with banding procedure in the near future, then have patient follow-up with myself or Dr. Darrick Penna in the office to discuss a colonoscopy.

## 2012-12-01 ENCOUNTER — Other Ambulatory Visit: Payer: Self-pay | Admitting: *Deleted

## 2012-12-01 DIAGNOSIS — I2510 Atherosclerotic heart disease of native coronary artery without angina pectoris: Secondary | ICD-10-CM

## 2012-12-04 ENCOUNTER — Telehealth: Payer: Self-pay | Admitting: Gastroenterology

## 2012-12-04 NOTE — Telephone Encounter (Signed)
I called and spoke with patient's daughter. Dicussed banding procedure in future. Of course, pt recently underwent a CABG procedure, and she will need to recuperate fully from this prior to elective procedures from a GI standpoint.   Darl Pikes, please have patient come back end of March for follow-up. Thanks!

## 2012-12-04 NOTE — Progress Notes (Signed)
Faxed to PCP

## 2012-12-05 ENCOUNTER — Encounter: Payer: Self-pay | Admitting: Surgery

## 2012-12-05 ENCOUNTER — Ambulatory Visit (INDEPENDENT_AMBULATORY_CARE_PROVIDER_SITE_OTHER): Payer: Self-pay | Admitting: Surgery

## 2012-12-05 ENCOUNTER — Ambulatory Visit
Admission: RE | Admit: 2012-12-05 | Discharge: 2012-12-05 | Disposition: A | Discharge status: Home / Self Care | Payer: Medicare Other | Source: Ambulatory Visit | Attending: Surgery | Admitting: Surgery

## 2012-12-05 VITALS — BP 104/60 | HR 73 | Resp 16 | Ht 61.0 in | Wt 115.0 lb

## 2012-12-05 DIAGNOSIS — I2510 Atherosclerotic heart disease of native coronary artery without angina pectoris: Secondary | ICD-10-CM

## 2012-12-05 DIAGNOSIS — Z951 Presence of aortocoronary bypass graft: Secondary | ICD-10-CM

## 2012-12-05 NOTE — Patient Instructions (Signed)
She is encouraged to continue to increase ambulation and attend cardiac rehabilitation program. She does not drive. She understands ongoing lifting restrictions.

## 2012-12-05 NOTE — Progress Notes (Signed)
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 The patient is seen with a translator and her daughter who speaks [place]. The patient herself does not speak [place]. They report that she is doing much better and is significantly stronger than she was preoperatively. Her exercise tolerance is increasing. She denies shortness of breath, chest pain or any constitutional symptoms. She is scheduled to see Dr. Graciela Husbands tomorrow in cardiology followup for her pacemaker and has a future appointment with Dr. Excell Seltzer.           History  Smoking status  . Former Smoker  Smokeless tobacco  . Not on file       No Known Allergies  Current Outpatient Prescriptions  Medication Sig Dispense Refill  . aspirin EC 325 MG EC tablet Take 1 tablet (325 mg total) by mouth daily.  30 tablet  12  . benzonatate (TESSALON) 100 MG capsule Take 1 capsule (100 mg total) by mouth 3 (three) times daily as needed for cough.  20 capsule  0  . carvedilol (COREG) 6.25 MG tablet Take 1 tablet (6.25 mg total) by mouth 2 (two) times daily with a meal.  60 tablet  1  . diclofenac sodium (VOLTAREN) 1 % GEL Apply 1 application topically.        . gabapentin (NEURONTIN) 100 MG capsule Take 100 mg by mouth at bedtime.        [name] Kitchen glucose blood test strip Use as instructed  100 each  12  . glucose monitoring kit (FREESTYLE) monitoring kit 1 each by Does not apply route as needed for other.  1 each  0  . hydrocortisone (ANUSOL-HC) 2.5 % rectal cream  Place rectally 2 (two) times daily.  30 g  0  . hydrocortisone (ANUSOL-HC) 25 MG suppository Place 1 suppository (25 mg total) rectally every 12 (twelve) hours. For 7 days  14 suppository  0  . hydrocortisone-pramoxine (PROCTOFOAM-HC) rectal foam Place 1 applicator rectally 2 (two) times daily.      . Lancets (FREESTYLE) lancets Check blood sugar TID AC and QHS  100 each  12  . metFORMIN (GLUCOPHAGE) 500 MG tablet Take 500 mg by mouth daily with breakfast.      . omeprazole (PRILOSEC) 20 MG capsule Take 20 mg by mouth 2 (two) times daily.        [name] Kitchen oxyCODONE (OXY IR/ROXICODONE) 5 MG immediate release tablet Take 1-2 tablets (5-10 mg total) by mouth every 4 (four) hours as needed.  50 tablet  0  . polyethylene glycol (MIRALAX / GLYCOLAX) packet Take 17 g by mouth daily.      . simvastatin (ZOCOR) 20 MG tablet Take 20 mg by mouth at bedtime.         No current facility-administered medications for this visit.       Physical  Exam: BP 104/60  Pulse 73  Resp 16  Ht 5\' 1"  (1.549 m)  Wt 115 lb (52.164 kg)  BMI 21.74 kg/m2  SpO2 98%  General appearance: alert, cooperative and no distress Heart: regular rate and rhythm and S1, S2 normal Lungs: clear to auscultation bilaterally Extremities: Minor edema of lower extremities Wound: Incisions healing well without evidence of infection Wounds:  Diagnostic Studies & Laboratory data:         Recent Radiology Findings: Dg Chest 2 View  12/05/2012  *RADIOLOGY REPORT*  Clinical Data: Coronary disease, bypass 4 weeks ago, follow-up  CHEST - 2 VIEW  Comparison: 11/06/2012  Findings: Stable coronary bypass changes and left subclavian AICD insertion.  Mild cardiac enlargement but normal vascularity. Negative for CHF or pneumonia.  No collapse, consolidation, edema, effusion or pneumothorax.  Trachea midline.  Nonobstructive bowel gas pattern.  Degenerative changes of the spine.  IMPRESSION: Stable postoperative changes.  No acute chest process.    Original Report Authenticated By: Judie Petit. Miles Costain, M.D.       Recent Labs: Lab Results  Component Value Date   WBC 3.8* 11/08/2012   HGB 10.8* 11/08/2012   HCT 32.9* 11/08/2012   PLT 168 11/08/2012   GLUCOSE 86 11/09/2012   CHOL 197 07/31/2007   TRIG 377* 07/31/2007   HDL 29.0* 07/31/2007   LDLDIRECT 122.2 07/31/2007   ALT 12 02/17/2010   AST 18 02/17/2010   NA 134* 11/09/2012   K 4.0 11/09/2012   CL 97 11/09/2012   CREATININE 0.88 11/09/2012   BUN 22 11/09/2012   CO2 22 11/09/2012   TSH 2.14 05/18/2007   INR 1.32 11/03/2012   HGBA1C 6.2* 11/02/2012      Assessment / Plan:  The patient continues to do well. She will continue her rehabilitation and the family is investigating the outpatient program. She is unsure if she will qualify for this with her insurance. She does not drive anymore. Of note her blood sugars have also been in the 80-100 range and she has stopped taking her Glucophage. As she has no current surgical issues we will see her again on a when necessary basis at request.          GOLD,WAYNE E 12/05/2012 2:59 PM

## 2012-12-06 ENCOUNTER — Ambulatory Visit (INDEPENDENT_AMBULATORY_CARE_PROVIDER_SITE_OTHER): Payer: Medicare Other | Admitting: Internal Medicine

## 2012-12-06 ENCOUNTER — Encounter: Payer: Self-pay | Admitting: Gastroenterology

## 2012-12-06 ENCOUNTER — Encounter: Payer: Self-pay | Admitting: Internal Medicine

## 2012-12-06 VITALS — BP 115/67 | HR 78 | Ht 61.0 in | Wt 117.8 lb

## 2012-12-06 DIAGNOSIS — I2589 Other forms of chronic ischemic heart disease: Secondary | ICD-10-CM

## 2012-12-06 DIAGNOSIS — Z95810 Presence of automatic (implantable) cardiac defibrillator: Secondary | ICD-10-CM

## 2012-12-06 DIAGNOSIS — I5022 Chronic systolic (congestive) heart failure: Secondary | ICD-10-CM

## 2012-12-06 MED ORDER — CARVEDILOL 6.25 MG PO TABS: 6.2500 mg | ORAL_TABLET | Freq: Two times a day (BID) | ORAL | Status: DC

## 2012-12-06 MED ORDER — SIMVASTATIN 20 MG PO TABS: 20.0000 mg | ORAL_TABLET | Freq: Every day | ORAL | Status: DC

## 2012-12-06 MED ORDER — LISINOPRIL 2.5 MG PO TABS: 2.5000 mg | ORAL_TABLET | Freq: Every day | ORAL | Status: DC

## 2012-12-06 NOTE — Assessment & Plan Note (Signed)
The patient's device was interrogated.  The information was reviewed. No changes were made in the programming.    

## 2012-12-06 NOTE — Patient Instructions (Signed)
Your physician has recommended you make the following change in your medication:  1) Decrease aspirin to 81 mg once daily. 2) Start lisinopril 2.5 mg once daily.  Your physician recommends that you return for FASTING lab work in: 4 weeks- bmp/ lipid  Remote monitoring is used to monitor your Pacemaker of ICD from home. This monitoring reduces the number of office visits required to check your device to one time per year. It allows [place] to keep an eye on the functioning of your device to ensure it is working properly. You are scheduled for a device check from home on 03/05/13. You may send your transmission at any time that day. If you have a wireless device, the transmission will be sent automatically. After your physician reviews your transmission, you will receive a postcard with your next transmission date.  Your physician wants you to follow-up in: 6 months with Dr. Logan Bores will receive a reminder letter in the mail two months in advance. If you don't receive a letter, please call our office to schedule the follow-up appointment.

## 2012-12-06 NOTE — Assessment & Plan Note (Addendum)
The patient has moderate LV dysfunction. She also has diabetes. We will resume her lisinopril at 2.5. She's had a history of hyperkalemia so we'll keep close attention to her potassium and check in 2 weeks.  We'll decrease the aspirin from 325-81

## 2012-12-06 NOTE — Assessment & Plan Note (Signed)
Stable and euvolemic continue current medications except as noted  above

## 2012-12-06 NOTE — Progress Notes (Signed)
Patient Care Team: Ignatius Specking, MD as PCP - General (Internal Medicine)   HPI  [name] is a 72 y.o. female Seen in followup for congestive heart failure in the setting of ischemic cardiomyopathy with previously implanted ICD. She was developing chest pain in the fall. Myoview scan was abnormal. She underwent catheterization. This demonstrated 70% proximal right 80% proximal LAD and a 95% left main. EF was 35% and she underwent bypass surgery  She is having less exertional dyspnea or chest discomfort. There is residual chest wall discomfort from her surgery there is no edema  Last BMET 11/07/12 K 4.7 Cr o.8  Abjo=goodbye  Kamcho==hello how are you  Abaard==thank you  Past Medical History  Diagnosis Date  . PSVT (paroxysmal supraventricular tachycardia)   . Ischemic cardiomyopathy     Anterior MI 2005 with primary PCI: cath 2013Cardiac catheterization showed a heavily calcified left main with a long 95% stenosis. The LAD had 80% proximal stenosis. The right coronary was a large dominant vessel with proximal 70% stenosis and 50% distal stenosis before the bifurcation. Left ventricular ejection fraction was 35% with akinesis of the anterior apical wall  . Implantable cardiac defibrillator Medtronic   . Chronic systolic heart failure   . 6045 lead 09/06/2011    Past Surgical History  Procedure Laterality Date  . Cardiac defibrillator placement      Medtronic  . Coronary artery bypass graft  11/03/2012    Procedure: CORONARY ARTERY BYPASS GRAFTING (CABG);  Surgeon: Alleen Borne, MD;  Location: [facility] OR;  Service: Open Heart Surgery;  Laterality: N/A;  . Intraoperative transesophageal echocardiogram  11/03/2012    Procedure: INTRAOPERATIVE TRANSESOPHAGEAL ECHOCARDIOGRAM;  Surgeon: Alleen Borne, MD;  Location: [facility] OR;  Service: Open Heart Surgery;  Laterality: N/A;    Current Outpatient Prescriptions  Medication Sig Dispense Refill  . aspirin EC 325 MG EC tablet Take 1 tablet  (325 mg total) by mouth daily.  30 tablet  12  . benzonatate (TESSALON) 100 MG capsule Take 1 capsule (100 mg total) by mouth 3 (three) times daily as needed for cough.  20 capsule  0  . carvedilol (COREG) 6.25 MG tablet Take 1 tablet (6.25 mg total) by mouth 2 (two) times daily with a meal.  60 tablet  1  . diclofenac sodium (VOLTAREN) 1 % GEL Apply 1 application topically.        . gabapentin (NEURONTIN) 100 MG capsule Take 100 mg by mouth at bedtime.        [name] Kitchen glucose blood test strip Use as instructed  100 each  12  . glucose monitoring kit (FREESTYLE) monitoring kit 1 each by Does not apply route as needed for other.  1 each  0  . hydrocortisone (ANUSOL-HC) 2.5 % rectal cream Place rectally 2 (two) times daily.  30 g  0  . hydrocortisone (ANUSOL-HC) 25 MG suppository Place 1 suppository (25 mg total) rectally every 12 (twelve) hours. For 7 days  14 suppository  0  . hydrocortisone-pramoxine (PROCTOFOAM-HC) rectal foam Place 1 applicator rectally 2 (two) times daily.      . Lancets (FREESTYLE) lancets Check blood sugar TID AC and QHS  100 each  12  . metFORMIN (GLUCOPHAGE) 500 MG tablet Take 500 mg by mouth daily with breakfast.      . omeprazole (PRILOSEC) 20 MG capsule Take 20 mg by mouth 2 (two) times daily.        [name] Kitchen oxyCODONE (OXY IR/ROXICODONE) 5 MG immediate  release tablet Take 1-2 tablets (5-10 mg total) by mouth every 4 (four) hours as needed.  50 tablet  0  . polyethylene glycol (MIRALAX / GLYCOLAX) packet Take 17 g by mouth daily.      . simvastatin (ZOCOR) 20 MG tablet Take 20 mg by mouth at bedtime.         No current facility-administered medications for this visit.    No Known Allergies  Review of Systems negative except from HPI and PMH  Physical Exam BP 115/67  Pulse 78  Ht 5\' 1"  (1.549 m)  Wt 117 lb 12.8 oz (53.434 kg)  BMI 22.27 kg/m2 Well developed and well nourished in no acute distress HENT normal E scleral and icterus clear Neck Supple JVP flat; carotids  brisk and full Clear to ausculation  egular rate and rhythm, 2/6 systoloic mSoft with active bowel sounds No clubbing cyanosis none Edema Alert and oriented, grossly normal motor and sensory function Skin Warm and Dry  ECG demonstrates sinus rhythm at 71 intervals 13/08/40 Axis rightward 107 which is unchanged  Assessment and  Plan

## 2012-12-07 ENCOUNTER — Telehealth: Payer: Self-pay | Admitting: Gastroenterology

## 2012-12-07 NOTE — Telephone Encounter (Signed)
Rajen Mariana Single is the intrpr for this patient and has questions regarding the banding. Patient has had open heart surgery about a month ago and would prefer having the banding in [place] if possible. Patient also would need the procedure for banding to be done any day but Friday and has to be after 11am. Please advise and call the intrpr back at [phone] and he will relay message to the patient.

## 2012-12-07 NOTE — Telephone Encounter (Signed)
Pt is aware of OV on 3/27 at 130 with AS and appt card was mailed

## 2012-12-07 NOTE — Telephone Encounter (Signed)
Called and spoke to the interpreter. He was asking questions about having procedure done in [place] because of transportation. But when he found out if pt has it done in [place], she will need to have a consult with someone in [place] first, he just said to go ahead and try to schedule with Dr. Darrick Penna as out patient at [facility] on any day other than a Friday and after 11:00 Am. We can call his number listed above to do so and he will relay the message.

## 2012-12-12 NOTE — Telephone Encounter (Signed)
We need to have her come to the office for a follow-up before the procedure. Dr. Darrick Penna agreed to do the procedure without a colonoscopy first.  I had spoken with the daughter about having her return in March. Is this still ok?

## 2012-12-13 NOTE — Telephone Encounter (Signed)
Called and informed the interpreter, Rajen. He is aware of the pt's appt on 01/11/2013 at 1:30 PM with [name], NP.  He said that we should call the # [phone] and ask for interpreter for language of Gujarati, and tell them that the patient requests Rajen.

## 2012-12-18 ENCOUNTER — Other Ambulatory Visit: Payer: Self-pay

## 2012-12-18 MED ORDER — HYDROCORTISONE ACETATE 25 MG RE SUPP: 25.0000 mg | Freq: Two times a day (BID) | RECTAL | Status: DC

## 2012-12-22 ENCOUNTER — Encounter: Payer: Medicare Other | Admitting: Physician Assistant

## 2013-01-03 ENCOUNTER — Other Ambulatory Visit (INDEPENDENT_AMBULATORY_CARE_PROVIDER_SITE_OTHER): Payer: Medicare Other

## 2013-01-03 DIAGNOSIS — Z95810 Presence of automatic (implantable) cardiac defibrillator: Secondary | ICD-10-CM

## 2013-01-03 DIAGNOSIS — I2589 Other forms of chronic ischemic heart disease: Secondary | ICD-10-CM

## 2013-01-03 DIAGNOSIS — I5022 Chronic systolic (congestive) heart failure: Secondary | ICD-10-CM

## 2013-01-03 LAB — BASIC METABOLIC PANEL
BUN: 10 mg/dL (ref 6–23)
CO2: 26 mEq/L (ref 19–32)
Chloride: 105 mEq/L (ref 96–112)
Creatinine, Ser: 0.8 mg/dL (ref 0.4–1.2)

## 2013-01-03 LAB — LIPID PANEL
Cholesterol: 198 mg/dL (ref 0–200)
Triglycerides: 167 mg/dL — ABNORMAL HIGH (ref 0.0–149.0)

## 2013-01-11 ENCOUNTER — Ambulatory Visit (INDEPENDENT_AMBULATORY_CARE_PROVIDER_SITE_OTHER): Payer: Medicare Other | Admitting: Gastroenterology

## 2013-01-11 ENCOUNTER — Encounter: Payer: Self-pay | Admitting: Gastroenterology

## 2013-01-11 VITALS — BP 110/68 | HR 73 | Temp 98.0°F | Ht 59.0 in | Wt 117.0 lb

## 2013-01-11 DIAGNOSIS — K648 Other hemorrhoids: Secondary | ICD-10-CM

## 2013-01-11 MED ORDER — HYDROCORTISONE 2.5 % RE CREA: TOPICAL_CREAM | Freq: Two times a day (BID) | RECTAL | Status: DC

## 2013-01-11 NOTE — Assessment & Plan Note (Signed)
73 year old female s/p CABG X 4 in Jan 2014, presenting originally in Feb 2014 to our office with prolapsed internal hemorrhoids. Started on bowel regimen, addition of fiber, anusol. She now has significant improvement in symptoms, and physical exam with only 2 small hemorrhoidal tags. She would like to proceed with the banding procedure with Dr. Darrick Penna. NO PRIOR LOWER GI TRACT EVALUATION VIA COLONOSCOPY has been done. She understands this needs to be performed at some point this year, but I have already discussed with Dr. Darrick Penna her desire to proceed with banding first. Due to her daughter-in-law's diagnosis of rectal cancer and upcoming surgery, she desires to wait until May.   Proceed with band procedure in May with Dr. Val Riles cream provided.  Colonoscopy at later date. As of note, I expressed our recommendation of colonoscopy first; however, she and her family have desired to wait until she has recovered more from cardiac surgery.

## 2013-01-11 NOTE — Patient Instructions (Addendum)
I have sent the prescription for cream to your pharmacy. Use this once to twice a day for 7 days. Then, let it rest a few days. If you need to repeat, you may do so.  We have scheduled you for the banding procedure in May with Dr. Darrick Penna.

## 2013-01-11 NOTE — Progress Notes (Signed)
Referring Provider: Ignatius Specking., MD Primary Care Physician:  Ignatius Specking., MD Primary Gastroenterologist: Dr. Darrick Penna   Chief Complaint  Patient presents with  . Follow-up    HPI:   [name] is a pleasant 72 year old female who presents today in follow-up secondary to hemorrhoids. She actually underwent a CABG X 4 on 11/03/12 by Dr. Laneta Simmers. She was seen by myself in Feb 2014 as a self-referral. At that time, she was hesitant to proceed with a colonoscopy due to her recent heart surgery. She would rather undergo banding with a full lower GI evaluation later. This was discussed with Dr. Darrick Penna, who agreed with this plan.  She is here with her interpreter. She notes significant improvement of hemorrhoids with cream. No rectal discomfort, constipation, rectal bleeding. Her daughter-in-law has rectal cancer and is due for surgery April 20th. Patient would like to wait until May to proceed with [facility] banding.   Past Medical History  Diagnosis Date  . PSVT (paroxysmal supraventricular tachycardia)   . Ischemic cardiomyopathy     Anterior MI 2005 with primary PCI: cath 2013Cardiac catheterization showed a heavily calcified left main with a long 95% stenosis. The LAD had 80% proximal stenosis. The right coronary was a large dominant vessel with proximal 70% stenosis and 50% distal stenosis before the bifurcation. Left ventricular ejection fraction was 35% with akinesis of the anterior apical wall  . Implantable cardiac defibrillator Medtronic   . Chronic systolic heart failure   . 1610 lead 09/06/2011    Past Surgical History  Procedure Laterality Date  . Cardiac defibrillator placement      Medtronic  . Coronary artery bypass graft  11/03/2012    Procedure: CORONARY ARTERY BYPASS GRAFTING (CABG);  Surgeon: Alleen Borne, MD;  Location: [facility] OR;  Service: Open Heart Surgery;  Laterality: N/A;  . Intraoperative transesophageal echocardiogram  11/03/2012    Procedure: INTRAOPERATIVE TRANSESOPHAGEAL  ECHOCARDIOGRAM;  Surgeon: Alleen Borne, MD;  Location: [facility] OR;  Service: Open Heart Surgery;  Laterality: N/A;    Current Outpatient Prescriptions  Medication Sig Dispense Refill  . aspirin EC 81 MG tablet Take 1 tablet (81 mg total) by mouth daily.      . benzonatate (TESSALON) 100 MG capsule Take 1 capsule (100 mg total) by mouth 3 (three) times daily as needed for cough.  20 capsule  0  . carvedilol (COREG) 6.25 MG tablet Take 1 tablet (6.25 mg total) by mouth 2 (two) times daily with a meal.  60 tablet  1  . diclofenac sodium (VOLTAREN) 1 % GEL Apply 1 application topically.        . gabapentin (NEURONTIN) 100 MG capsule Take 100 mg by mouth at bedtime.        [name] Kitchen glucose blood test strip Use as instructed  100 each  12  . glucose monitoring kit (FREESTYLE) monitoring kit 1 each by Does not apply route as needed for other.  1 each  0  . Lancets (FREESTYLE) lancets Check blood sugar TID AC and QHS  100 each  12  . lisinopril (PRINIVIL,ZESTRIL) 2.5 MG tablet Take 1 tablet (2.5 mg total) by mouth daily.  90 tablet  3  . metFORMIN (GLUCOPHAGE) 500 MG tablet Take 500 mg by mouth daily with breakfast.      . omeprazole (PRILOSEC) 20 MG capsule Take 20 mg by mouth 2 (two) times daily.        [name] Kitchen oxyCODONE (OXY IR/ROXICODONE) 5 MG immediate release tablet Take 1-2 tablets (  5-10 mg total) by mouth every 4 (four) hours as needed.  50 tablet  0  . polyethylene glycol (MIRALAX / GLYCOLAX) packet Take 17 g by mouth daily.      . simvastatin (ZOCOR) 20 MG tablet Take 1 tablet (20 mg total) by mouth at bedtime.  30 tablet  6  . hydrocortisone (ANUSOL-HC) 2.5 % rectal cream Place rectally 2 (two) times daily.  30 g  0  . hydrocortisone (ANUSOL-HC) 25 MG suppository Place 1 suppository (25 mg total) rectally every 12 (twelve) hours.  20 suppository  0  . hydrocortisone-pramoxine (PROCTOFOAM-HC) rectal foam Place 1 applicator rectally 2 (two) times daily.       No current facility-administered medications for  this visit.    Allergies as of 01/11/2013  . (No Known Allergies)    Family History  Problem Relation Age of Onset  . Colon cancer Neg Hx     History   Social History  . Marital Status: Married    Spouse Name: N/A    Number of Children: N/A  . Years of Education: N/A   Social History Main Topics  . Smoking status: Former Games developer  . Smokeless tobacco: None  . Alcohol Use: No  . Drug Use: None  . Sexually Active: None   Other Topics Concern  . None   Social History Narrative  . None    Review of Systems: Negative unless mentioned in HPI.   Physical Exam: BP 110/68  Pulse 73  Temp(Src) 98 F (36.7 C) (Oral)  Ht 4\' 11"  (1.499 m)  Wt 117 lb (53.071 kg)  BMI 23.62 kg/m2 General:   Alert and oriented. No distress noted. Pleasant and cooperative.  Head:  Normocephalic and atraumatic. Eyes:  Conjuctiva clear without scleral icterus. Mouth:  Oral mucosa pink and moist. Good dentition. No lesions. Heart:  S1, S2 present without murmurs, rubs, or gallops. Regular rate and rhythm. Abdomen:  +BS, soft, non-tender and non-distended. No rebound or guarding. No HSM or masses noted. Rectal: external exam only, internal exam performed in Feb 2014. Small hemorrhoidal tag noted X 2.  Msk:  Symmetrical without gross deformities. Normal posture. Extremities:  Without edema. Skin:  Intact without significant lesions or rashes. Psych:  Alert and cooperative. Normal mood and affect.'

## 2013-01-17 ENCOUNTER — Ambulatory Visit: Payer: Medicare Other | Admitting: Gastroenterology

## 2013-01-18 ENCOUNTER — Other Ambulatory Visit: Payer: Self-pay

## 2013-01-18 ENCOUNTER — Other Ambulatory Visit: Payer: Self-pay | Admitting: *Deleted

## 2013-01-18 MED ORDER — CARVEDILOL 6.25 MG PO TABS: 6.2500 mg | ORAL_TABLET | Freq: Two times a day (BID) | ORAL | Status: DC

## 2013-01-19 ENCOUNTER — Other Ambulatory Visit: Payer: Self-pay | Admitting: *Deleted

## 2013-01-19 MED ORDER — SIMVASTATIN 40 MG PO TABS: 40.0000 mg | ORAL_TABLET | Freq: Every day | ORAL | Status: DC

## 2013-03-01 ENCOUNTER — Ambulatory Visit (INDEPENDENT_AMBULATORY_CARE_PROVIDER_SITE_OTHER): Payer: Medicare Other | Admitting: *Deleted

## 2013-03-01 ENCOUNTER — Encounter: Payer: Self-pay | Admitting: Internal Medicine

## 2013-03-01 DIAGNOSIS — I471 Supraventricular tachycardia, unspecified: Secondary | ICD-10-CM

## 2013-03-01 DIAGNOSIS — I2589 Other forms of chronic ischemic heart disease: Secondary | ICD-10-CM

## 2013-03-01 DIAGNOSIS — I5022 Chronic systolic (congestive) heart failure: Secondary | ICD-10-CM

## 2013-03-01 LAB — ICD DEVICE OBSERVATION
CHARGE TIME: 9.68 s
DEV-0020ICD: NEGATIVE
FVT: 0
RV LEAD AMPLITUDE: 10.4 mv
RV LEAD IMPEDENCE ICD: 496 Ohm
RV LEAD THRESHOLD: 1 V
TZAT-0001SLOWVT: 1
TZAT-0001SLOWVT: 2
TZAT-0005SLOWVT: 84 pct
TZAT-0005SLOWVT: 91 pct
TZAT-0011FASTVT: 10 ms
TZAT-0012FASTVT: 200 ms
TZAT-0013SLOWVT: 2
TZAT-0013SLOWVT: 2
TZAT-0018FASTVT: NEGATIVE
TZAT-0018SLOWVT: NEGATIVE
TZAT-0018SLOWVT: NEGATIVE
TZAT-0019FASTVT: 8 V
TZAT-0020SLOWVT: 1.6 ms
TZON-0004SLOWVT: 20
TZON-0005SLOWVT: 12
TZON-0008FASTVT: 0 ms
TZON-0008SLOWVT: 0 ms
TZON-0010FASTVT: 30 ms
TZON-0010SLOWVT: 30 ms
TZST-0001FASTVT: 3
TZST-0001FASTVT: 6
TZST-0001SLOWVT: 3
TZST-0001SLOWVT: 6
TZST-0003FASTVT: 35 J
TZST-0003SLOWVT: 35 J
TZST-0003SLOWVT: 35 J

## 2013-03-01 NOTE — Progress Notes (Signed)
ICD check in clinic 

## 2013-03-21 ENCOUNTER — Encounter (HOSPITAL_COMMUNITY): Payer: Self-pay | Admitting: General Practice

## 2013-03-21 ENCOUNTER — Encounter (HOSPITAL_COMMUNITY): Payer: Self-pay | Admitting: Pharmacy Technician

## 2013-03-21 ENCOUNTER — Ambulatory Visit (INDEPENDENT_AMBULATORY_CARE_PROVIDER_SITE_OTHER): Payer: Medicare Other | Admitting: *Deleted

## 2013-03-21 ENCOUNTER — Inpatient Hospital Stay (HOSPITAL_COMMUNITY)
Admission: AD | Admit: 2013-03-21 | Discharge: 2013-03-23 | DRG: 261 | Disposition: A | Discharge status: Home / Self Care | Payer: Medicare Other | Source: Ambulatory Visit | Attending: Internal Medicine | Admitting: Internal Medicine

## 2013-03-21 ENCOUNTER — Encounter: Payer: Self-pay | Admitting: Internal Medicine

## 2013-03-21 DIAGNOSIS — I509 Heart failure, unspecified: Secondary | ICD-10-CM | POA: Diagnosis present

## 2013-03-21 DIAGNOSIS — I2589 Other forms of chronic ischemic heart disease: Secondary | ICD-10-CM | POA: Diagnosis present

## 2013-03-21 DIAGNOSIS — E119 Type 2 diabetes mellitus without complications: Secondary | ICD-10-CM | POA: Diagnosis present

## 2013-03-21 DIAGNOSIS — K219 Gastro-esophageal reflux disease without esophagitis: Secondary | ICD-10-CM | POA: Diagnosis present

## 2013-03-21 DIAGNOSIS — I471 Supraventricular tachycardia: Secondary | ICD-10-CM

## 2013-03-21 DIAGNOSIS — Z9861 Coronary angioplasty status: Secondary | ICD-10-CM

## 2013-03-21 DIAGNOSIS — T82198A Other mechanical complication of other cardiac electronic device, initial encounter: Principal | ICD-10-CM | POA: Diagnosis present

## 2013-03-21 DIAGNOSIS — I5022 Chronic systolic (congestive) heart failure: Secondary | ICD-10-CM | POA: Diagnosis present

## 2013-03-21 DIAGNOSIS — Z951 Presence of aortocoronary bypass graft: Secondary | ICD-10-CM

## 2013-03-21 DIAGNOSIS — Z87891 Personal history of nicotine dependence: Secondary | ICD-10-CM

## 2013-03-21 DIAGNOSIS — I2510 Atherosclerotic heart disease of native coronary artery without angina pectoris: Secondary | ICD-10-CM | POA: Diagnosis present

## 2013-03-21 DIAGNOSIS — Y831 Surgical operation with implant of artificial internal device as the cause of abnormal reaction of the patient, or of later complication, without mention of misadventure at the time of the procedure: Secondary | ICD-10-CM | POA: Diagnosis present

## 2013-03-21 DIAGNOSIS — Z7982 Long term (current) use of aspirin: Secondary | ICD-10-CM

## 2013-03-21 DIAGNOSIS — I498 Other specified cardiac arrhythmias: Secondary | ICD-10-CM | POA: Diagnosis present

## 2013-03-21 DIAGNOSIS — T827XXA Infection and inflammatory reaction due to other cardiac and vascular devices, implants and grafts, initial encounter: Secondary | ICD-10-CM

## 2013-03-21 DIAGNOSIS — Z79899 Other long term (current) drug therapy: Secondary | ICD-10-CM

## 2013-03-21 DIAGNOSIS — E785 Hyperlipidemia, unspecified: Secondary | ICD-10-CM | POA: Diagnosis present

## 2013-03-21 DIAGNOSIS — I252 Old myocardial infarction: Secondary | ICD-10-CM

## 2013-03-21 HISTORY — DX: Atherosclerotic heart disease of native coronary artery without angina pectoris: I25.10

## 2013-03-21 HISTORY — DX: Acute myocardial infarction, unspecified: I21.9

## 2013-03-21 HISTORY — DX: Personal history of other medical treatment: Z92.89

## 2013-03-21 HISTORY — DX: Presence of automatic (implantable) cardiac defibrillator: Z95.810

## 2013-03-21 HISTORY — DX: Gastro-esophageal reflux disease without esophagitis: K21.9

## 2013-03-21 LAB — ICD DEVICE OBSERVATION
DEV-0020ICD: NEGATIVE
TZAT-0004FASTVT: 8
TZAT-0004SLOWVT: 8
TZAT-0004SLOWVT: 8
TZAT-0005FASTVT: 88 pct
TZAT-0005SLOWVT: 84 pct
TZAT-0005SLOWVT: 91 pct
TZAT-0011FASTVT: 10 ms
TZAT-0011SLOWVT: 10 ms
TZAT-0011SLOWVT: 10 ms
TZAT-0012SLOWVT: 200 ms
TZAT-0012SLOWVT: 200 ms
TZAT-0013FASTVT: 1
TZAT-0013SLOWVT: 2
TZAT-0013SLOWVT: 2
TZAT-0018FASTVT: NEGATIVE
TZAT-0020SLOWVT: 1.6 ms
TZAT-0020SLOWVT: 1.6 ms
TZON-0003SLOWVT: 370 ms
TZON-0004SLOWVT: 20
TZON-0010AFLUTTER: 30 ms
TZON-0010SLOWVT: 30 ms
TZON-0010VSLOWVT: 30 ms
TZON-0011AFLUTTER: 70
TZST-0001FASTVT: 3
TZST-0001FASTVT: 5
TZST-0001SLOWVT: 5
TZST-0001SLOWVT: 6
TZST-0003FASTVT: 35 J
TZST-0003FASTVT: 35 J
TZST-0003FASTVT: 35 J
TZST-0003SLOWVT: 25 J
TZST-0003SLOWVT: 35 J
TZST-0003SLOWVT: 35 J

## 2013-03-21 LAB — CBC WITH DIFFERENTIAL/PLATELET
Eosinophils Absolute: 0.1 10*3/uL (ref 0.0–0.7)
Eosinophils Relative: 2 % (ref 0–5)
HCT: 34.1 % — ABNORMAL LOW (ref 36.0–46.0)
Hemoglobin: 11.2 g/dL — ABNORMAL LOW (ref 12.0–15.0)
Lymphocytes Relative: 46 % (ref 12–46)
Lymphs Abs: 2.4 10*3/uL (ref 0.7–4.0)
MCH: 26 pg (ref 26.0–34.0)
MCV: 79.3 fL (ref 78.0–100.0)
Monocytes Absolute: 0.4 10*3/uL (ref 0.1–1.0)
Monocytes Relative: 7 % (ref 3–12)
Platelets: 219 10*3/uL (ref 150–400)
RBC: 4.3 MIL/uL (ref 3.87–5.11)
WBC: 5.1 10*3/uL (ref 4.0–10.5)

## 2013-03-21 LAB — BASIC METABOLIC PANEL
BUN: 10 mg/dL (ref 6–23)
CO2: 21 mEq/L (ref 19–32)
Calcium: 9.3 mg/dL (ref 8.4–10.5)
GFR calc non Af Amer: 73 mL/min — ABNORMAL LOW (ref >90)
Glucose, Bld: 78 mg/dL (ref 70–99)

## 2013-03-21 MED ORDER — ACETAMINOPHEN 325 MG PO TABS: 650.0000 mg | ORAL_TABLET | ORAL | Status: DC | PRN

## 2013-03-21 MED ORDER — LISINOPRIL 2.5 MG PO TABS
2.5000 mg | ORAL_TABLET | Freq: Every day | ORAL | Status: DC
  Administered 2013-03-22 – 2013-03-23 (×2): 2.5 mg via ORAL
  Filled 2013-03-21 (×3): qty 1

## 2013-03-21 MED ORDER — CHLORHEXIDINE GLUCONATE 4 % EX LIQD
60.0000 mL | Freq: Once | CUTANEOUS | Status: DC
  Filled 2013-03-21: qty 60

## 2013-03-21 MED ORDER — SODIUM CHLORIDE 0.9 % IR SOLN
80.0000 mg | Status: DC
  Filled 2013-03-21 (×2): qty 2

## 2013-03-21 MED ORDER — CARVEDILOL 6.25 MG PO TABS
6.2500 mg | ORAL_TABLET | Freq: Two times a day (BID) | ORAL | Status: DC
  Administered 2013-03-21: 6.25 mg via ORAL
  Filled 2013-03-21 (×2): qty 1

## 2013-03-21 MED ORDER — SODIUM CHLORIDE 0.9 % IV SOLN: INTRAVENOUS | Status: DC

## 2013-03-21 MED ORDER — CARVEDILOL 3.125 MG PO TABS
3.1250 mg | ORAL_TABLET | Freq: Two times a day (BID) | ORAL | Status: DC
  Administered 2013-03-22 – 2013-03-23 (×2): 3.125 mg via ORAL
  Filled 2013-03-21 (×5): qty 1

## 2013-03-21 MED ORDER — METFORMIN HCL 500 MG PO TABS
500.0000 mg | ORAL_TABLET | Freq: Every day | ORAL | Status: DC
Start: 2013-03-22 — End: 2013-03-23
  Filled 2013-03-21 (×3): qty 1

## 2013-03-21 MED ORDER — GABAPENTIN 100 MG PO CAPS
100.0000 mg | ORAL_CAPSULE | Freq: Every day | ORAL | Status: DC
  Administered 2013-03-21 – 2013-03-22 (×2): 100 mg via ORAL
  Filled 2013-03-21 (×3): qty 1

## 2013-03-21 MED ORDER — CEFAZOLIN SODIUM-DEXTROSE 2-3 GM-% IV SOLR
2.0000 g | INTRAVENOUS | Status: DC
  Filled 2013-03-21: qty 50

## 2013-03-21 MED ORDER — SIMVASTATIN 40 MG PO TABS
40.0000 mg | ORAL_TABLET | Freq: Every day | ORAL | Status: DC
  Administered 2013-03-21 – 2013-03-22 (×2): 40 mg via ORAL
  Filled 2013-03-21 (×3): qty 1

## 2013-03-21 MED ORDER — PANTOPRAZOLE SODIUM 40 MG PO TBEC
40.0000 mg | DELAYED_RELEASE_TABLET | Freq: Every day | ORAL | Status: DC
  Administered 2013-03-23: 40 mg via ORAL
  Filled 2013-03-21: qty 1

## 2013-03-21 MED ORDER — SODIUM CHLORIDE 0.9 % IV SOLN
INTRAVENOUS | Status: DC
  Administered 2013-03-21: 16:00:00 via INTRAVENOUS

## 2013-03-21 MED ORDER — ASPIRIN EC 81 MG PO TBEC
81.0000 mg | DELAYED_RELEASE_TABLET | Freq: Every day | ORAL | Status: DC
  Administered 2013-03-22 – 2013-03-23 (×2): 81 mg via ORAL
  Filled 2013-03-21 (×3): qty 1

## 2013-03-21 MED ORDER — CHLORHEXIDINE GLUCONATE 4 % EX LIQD
60.0000 mL | Freq: Once | CUTANEOUS | Status: AC
  Administered 2013-03-21: 4 via TOPICAL
  Filled 2013-03-21: qty 60

## 2013-03-21 NOTE — Progress Notes (Signed)
icd check in clinic for beeping. [phone] lead fracture. SIC 1335. Lead impedance > 3000. ICD turned off. Lead revision to be scheduled for 03-22-13. SK spoke to pt and pt aware of situation.

## 2013-03-21 NOTE — Progress Notes (Signed)
Patient's HR at rest is 57 and while ambulating in the hallway 550 feet her HR got up to 76. Lajuana Matte, RN

## 2013-03-21 NOTE — H&P (Signed)
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 ICD interrogation was performed in the office which revealed her RV lead impedance is >3000 and SIC >1300. Her tachy therapies were turned off and she is being directly admitted from the office for RV lead revision tomorrow.   She does not speak a lot of English; therefore, her history is obtained with the help of a translator via phone. She states she has been feeling well. She has no complaints other than occasional exertional fatigue which has been ongoing since CABG earlier this year. She denies CP, SOB or palpitations. She denies dizziness, near syncope or syncope. She denies LE swelling, orthopnea or PND. She denies ICD shocks. She reports compliance with her medications.   Past Medical History Past Medical History  Diagnosis Date  . PSVT (paroxysmal supraventricular tachycardia)   . Ischemic cardiomyopathy     Anterior MI 2005 with primary PCI: cath 2013Cardiac catheterization showed a heavily calcified left main with a long 95% stenosis. The LAD had 80% proximal stenosis. The right coronary was a large dominant vessel with proximal 70% stenosis and 50% distal stenosis before the bifurcation. Left ventricular ejection fraction was 35% with akinesis of the anterior apical wall  . Chronic systolic heart failure   . 7846 lead 09/06/2011  . Automatic implantable cardioverter-defibrillator in situ   . Coronary artery disease    . Myocardial infarction     "that's why they put ICD in" (03/20/2013)  . History of blood transfusion     "after heart surgery" (03/21/2013)  . GERD (gastroesophageal reflux disease)     Past Surgical History Past Surgical History  Procedure Laterality Date  . Cardiac defibrillator placement      Medtronic  . Intraoperative transesophageal echocardiogram  11/03/2012    Procedure: INTRAOPERATIVE TRANSESOPHAGEAL ECHOCARDIOGRAM;  Surgeon: Alleen Borne, MD;  Location: [facility] OR;  Service: Open Heart Surgery;  Laterality: N/A;  . Coronary artery bypass graft  11/03/2012    Procedure: CORONARY ARTERY BYPASS GRAFTING (CABG);  Surgeon: Alleen Borne, MD;  Location: [facility] OR;  Service: Open Heart Surgery;  Laterality: N/A;  . Cataract extraction w/ intraocular lens  implant, bilateral Bilateral ~2008  . Knee arthroscopy Left     Allergies/Intolerances No Known Allergies  Home Medications Medications Prior to Admission  Medication Sig Dispense Refill  . aspirin EC 81 MG tablet Take 1 tablet (81 mg total) by mouth daily.      . benzonatate (TESSALON) 100 MG capsule Take 1 capsule (100 mg total) by mouth 3 (three) times daily as needed for cough.  20 capsule  0  . carvedilol (COREG) 6.25 MG tablet Take 1 tablet (6.25 mg total) by mouth 2 (two) times daily with a meal.  60 tablet  3  . diclofenac sodium (VOLTAREN) 1 % GEL Apply 1 application topically.        . gabapentin (NEURONTIN) 100 MG capsule Take 100 mg by mouth at bedtime.        [name] Kitchen  glucose blood test strip Use as instructed  100 each  12  . glucose monitoring kit (FREESTYLE) monitoring kit 1 each by Does not apply route as needed for other.  1 each  0  . hydrocortisone (ANUSOL-HC) 25 MG suppository Place 1 suppository (25 mg total) rectally every 12 (twelve) hours.  20 suppository  0  . hydrocortisone-pramoxine (PROCTOFOAM-HC) rectal foam Place 1 applicator rectally 2 (two) times daily.      . Lancets (FREESTYLE) lancets Check blood sugar TID AC  and QHS  100 each  12  . lisinopril (PRINIVIL,ZESTRIL) 2.5 MG tablet Take 1 tablet (2.5 mg total) by mouth daily.  90 tablet  3  . metFORMIN (GLUCOPHAGE) 500 MG tablet Take 500 mg by mouth daily with breakfast.      . omeprazole (PRILOSEC) 20 MG capsule Take 20 mg by mouth 2 (two) times daily.        [name] Kitchen oxyCODONE (OXY IR/ROXICODONE) 5 MG immediate release tablet Take 1-2 tablets (5-10 mg total) by mouth every 4 (four) hours as needed.  50 tablet  0  . polyethylene glycol (MIRALAX / GLYCOLAX) packet Take 17 g by mouth daily.      . simvastatin (ZOCOR) 40 MG tablet Take 1 tablet (40 mg total) by mouth at bedtime.  30 tablet  6   Family History Family History  Problem Relation Age of Onset  . Colon cancer Neg Hx     Social History Social History  . Marital Status: Married   Social History Main Topics  . Smoking status: Former Games developer  . Smokeless tobacco: No  . Alcohol Use: No  . Drug Use: No   Review of Systems General: No chills, fever, night sweats or weight changes.  Cardiovascular: No chest pain, dyspnea on exertion, edema, orthopnea, palpitations, paroxysmal nocturnal dyspnea. Dermatological: No rash, lesions or masses. Respiratory: No cough, dyspnea. Urologic: No hematuria, dysuria. Abdominal: No nausea, vomiting, diarrhea, bright red blood per rectum, melena, or hematemesis. Neurologic: No visual changes, weakness, changes in mental status. All other systems reviewed and are otherwise negative except as noted above.  Physical Exam Blood pressure 127/68, pulse 70, temperature 98.1 F (36.7 C), temperature source Oral, resp. rate 18, height 5\' 2"  (1.575 m), weight 119 lb 9.6 oz (54.25 kg), SpO2 100.00%.  General: Well developed, well appearing 72 year old female in no acute distress. HEENT: Normocephalic, atraumatic. EOMs intact. Sclera nonicteric. Oropharynx clear.  Neck: Supple. No JVD. Lungs: Respirations regular and unlabored, CTA bilaterally. No wheezes, rales or  rhonchi. Heart: RRR. S1, S2 present. No murmur, rub, S3 or S4. Abdomen: Soft, non-tender, non-distended. BS present x 4 quadrants. No hepatosplenomegaly.  Extremities: No clubbing, cyanosis or edema. DP/PT/Radials 2+ and equal bilaterally. Psych: Normal affect. Neuro: Alert and oriented X 3. Moves all extremities spontaneously. Musculoskeletal: No kyphosis. Skin: Intact. Warm and dry. No rashes or petechiae in exposed areas.   Labs Admission labs pending  Radiology/Studies No results found.  12-lead ECG pending 12-lead ECG from Feb 2014 shows SR with narrow QRS Telemetry shows SR at 60 bpm  Device interrogation in the office today - Medtronic Maximo single chamber ICD - Battery voltage 2.66. RV lead impedance >3000. SIC 1335. VP <1%. By interrogation of all episodes, Ms. Valido has received ATP therapy previously but never ICD shock.  Assessment and Plan 1. 6949 lead fracture 2. Ischemic CM s/p single chamber ICD implant for primary prevention SCD Sept 2005 (Medtronic Maximo) 3. CAD s/p MI May 2005, recent CABG  Jan 2014  Ms. Roa presents to the office with her device beeping. On interrogation today she was found to have RV lead fracture. She will be admitted to telemetry for RV lead revision tomorrow, 03/22/2013. Risks, benefits and alternatives to lead revision were discussed in detail with Ms. Politte today. These risks include, but are not limited to, bleeding, infection, pneumothorax, perforation, tamponade, vascular damage, renal failure, lead dislodgement, MI, stroke and death. Ms. Rorabaugh expressed verbal understanding and agrees to proceed. Of note, she reports exertional fatigue since CABG and it would be prudent to rule out chronotropic incompetence while we have her here on telemetry to determine if she needs an atrial lead. We will have her ambulate in the hallway to document heart rate excursion with exercise. Her 12-lead ECG today is pending; however, based on her ECG from Feb  2014 she does not meet criteria for CRT as she has a narrow QRS.   Signed, Rick Duff, PA-C 03/21/2013, 12:09 PM The patient presented to day with beats related to her lead integrity alert from her 6949-lead. There is evidence of fracture. As suggested above chronotropic competence is worth clarifying.and heart rate in the hall was only 76 suggesting the value of an atrial lead.

## 2013-03-22 ENCOUNTER — Encounter (HOSPITAL_COMMUNITY)
Admission: AD | Disposition: A | Discharge status: Home / Self Care | Payer: Self-pay | Source: Ambulatory Visit | Attending: Internal Medicine

## 2013-03-22 DIAGNOSIS — T82198A Other mechanical complication of other cardiac electronic device, initial encounter: Secondary | ICD-10-CM

## 2013-03-22 HISTORY — PX: LEAD REVISION: SHX5945

## 2013-03-22 LAB — GLUCOSE, CAPILLARY
Glucose-Capillary: 92 mg/dL (ref 70–99)
Glucose-Capillary: 79 mg/dL (ref 70–99)
Glucose-Capillary: 113 mg/dL — ABNORMAL HIGH (ref 70–99)

## 2013-03-22 SURGERY — LEAD REVISION
Anesthesia: LOCAL

## 2013-03-22 MED ORDER — LIDOCAINE HCL (PF) 1 % IJ SOLN
INTRAMUSCULAR | Status: AC
  Filled 2013-03-22: qty 60

## 2013-03-22 MED ORDER — ACETAMINOPHEN 325 MG PO TABS
325.0000 mg | ORAL_TABLET | ORAL | Status: DC | PRN
  Administered 2013-03-22: 650 mg via ORAL
  Filled 2013-03-22: qty 2

## 2013-03-22 MED ORDER — FENTANYL CITRATE 0.05 MG/ML IJ SOLN
INTRAMUSCULAR | Status: AC
  Filled 2013-03-22: qty 2

## 2013-03-22 MED ORDER — CEFAZOLIN SODIUM 1-5 GM-% IV SOLN
1.0000 g | Freq: Four times a day (QID) | INTRAVENOUS | Status: AC
  Administered 2013-03-22 – 2013-03-23 (×3): 1 g via INTRAVENOUS
  Filled 2013-03-22 (×4): qty 50

## 2013-03-22 MED ORDER — HEPARIN (PORCINE) IN NACL 2-0.9 UNIT/ML-% IJ SOLN
INTRAMUSCULAR | Status: AC
  Filled 2013-03-22: qty 500

## 2013-03-22 MED ORDER — ONDANSETRON HCL 4 MG/2ML IJ SOLN: 4.0000 mg | Freq: Four times a day (QID) | INTRAMUSCULAR | Status: DC | PRN

## 2013-03-22 MED ORDER — MIDAZOLAM HCL 5 MG/5ML IJ SOLN
INTRAMUSCULAR | Status: AC
  Filled 2013-03-22: qty 5

## 2013-03-22 MED ORDER — OXYCODONE HCL 5 MG PO TABS
5.0000 mg | ORAL_TABLET | ORAL | Status: DC | PRN
  Administered 2013-03-22 – 2013-03-23 (×3): 5 mg via ORAL
  Filled 2013-03-22 (×3): qty 1

## 2013-03-22 MED ORDER — SODIUM CHLORIDE 0.9 % IV SOLN: 250.0000 mL | INTRAVENOUS | Status: DC | PRN

## 2013-03-22 NOTE — Progress Notes (Signed)
REVIEWED.  

## 2013-03-22 NOTE — Progress Notes (Signed)
   ELECTROPHYSIOLOGY ROUNDING NOTE    Patient Name: [name] Date of Encounter: 03-22-2013    SUBJECTIVE:Patient feels well this morning.  No chest pain or shortness of breath.  Admitted from the office yesterday for RV lead fracture (6949).  Plan for RV lead revision today.  Patient's daughter states that at home patient has been doing relatively well but tires out easily with activity.   TELEMETRY: Reviewed telemetry pt in sinus brady Filed Vitals:   03/21/13 1359 03/21/13 2010 03/22/13 0508 03/22/13 0542  BP: 124/49 117/67 122/68   Pulse: 57 64 60   Temp: 98 F (36.7 C) 97.2 F (36.2 C) 97.7 F (36.5 C)   TempSrc: Oral Oral Oral   Resp: 18 18 18   Height:      Weight:    116 lb 12.8 oz (52.98 kg)  SpO2: 100% 99% 99%     Intake/Output Summary (Last 24 hours) at 03/22/13 0724 Last data filed at 03/22/13 0500  Gross per 24 hour  Intake    240 ml  Output    251 ml  Net    -11 ml    LABS: Basic Metabolic Panel:  Recent Labs  03/21/13 1416  NA 138  K 4.5  CL 107  CO2 21  GLUCOSE 78  BUN 10  CREATININE 0.80  CALCIUM 9.3   CBC:  Recent Labs  03/21/13 1416  WBC 5.1  NEUTROABS 2.3  HGB 11.2*  HCT 34.1*  MCV 79.3  PLT 219     PHYSICAL EXAM Well appearing 72 yo woman, NAD HEENT: Unremarkable Neck:  7 cm JVD, no thyromegally Back:  No CVA tenderness Lungs:  Clear with no wheezes HEART:  Regular brady rhythm, no murmurs, no rubs, no clicks Abd:  Flat, positive bowel sounds, no organomegally, no rebound, no guarding Ext:  2 plus pulses, no edema, no cyanosis, no clubbing Skin:  No rashes no nodules Neuro:  CN II through XII intact, motor grossly intact   DEVICE INTERROGATION: Device interrogated yesterday in the office.  Reveals fracture of 6949 lead.  Battery voltage 2.66 and has been in since 2005.    A/P 1. ICD lead fracture 2. ICD near ERI 3. Sinus bradycardia 4. ICM, s/p CABG Rec: I have discussed the treatment options with the patient  and the risks/benefits/goals/and expectations of RV lead revision, insertion of a new RA lead and a new device have been discussed and she wishes to proceed.  Matheson Vandehei,M.D.  

## 2013-03-22 NOTE — Interval H&P Note (Signed)
History and Physical Interval Note:  03/22/2013 1:59 PM  [name]  has presented today for surgery, with the diagnosis of rv lead fracture  The various methods of treatment have been discussed with the patient and family. After consideration of risks, benefits and other options for treatment, the patient has consented to  Procedure(s): LEAD REVISION (N/A) as a surgical intervention .  The patient's history has been reviewed, patient examined, no change in status, stable for surgery.  I have reviewed the patient's chart and labs.  Questions were answered to the patient's satisfaction.     Lewayne Bunting

## 2013-03-22 NOTE — Progress Notes (Signed)
Utilization Review Completed.Lilia Letterman T6/02/2013  

## 2013-03-22 NOTE — Care Management Note (Unsigned)
    Page 1 of 1   03/22/2013     4:21:21 PM   CARE MANAGEMENT NOTE 03/22/2013  Patient:  [name]   Account Number:  [id number]  Date Initiated:  03/22/2013  Documentation initiated by:  Kehaulani Fruin  Subjective/Objective Assessment:   PT ADM WITH ICD LEAD FRACTURE; FOR REVISION TODAY.  PTA, PT RESIDES AT HOME WITH GRANDDAUGHTER.     Action/Plan:   WILL FOLLOW FOR HOME NEEDS AS PT PROGRESSES.   Anticipated DC Date:  03/24/2013   Anticipated DC Plan:  HOME/SELF CARE      DC Planning Services  CM consult      Choice offered to / List presented to:             Status of service:  In process, will continue to follow Medicare Important Message given?   (If response is "NO", the following Medicare IM given date fields will be blank) Date Medicare IM given:   Date Additional Medicare IM given:    Discharge Disposition:    Per UR Regulation:  Reviewed for med. necessity/level of care/duration of stay  If discussed at Long Length of Stay Meetings, dates discussed:    Comments:

## 2013-03-22 NOTE — Op Note (Signed)
ICD lead revision and insertion of a new atrial pacing lead and pocket revision without immediate complication. D#

## 2013-03-22 NOTE — H&P (View-Only) (Signed)
   ELECTROPHYSIOLOGY ROUNDING NOTE    Patient Name: [name] Date of Encounter: 03-22-2013    SUBJECTIVE:Patient feels well this morning.  No chest pain or shortness of breath.  Admitted from the office yesterday for RV lead fracture (1191).  Plan for RV lead revision today.  Patient's daughter states that at home patient has been doing relatively well but tires out easily with activity.   TELEMETRY: Reviewed telemetry pt in sinus brady Filed Vitals:   03/21/13 1359 03/21/13 2010 03/22/13 0508 03/22/13 0542  BP: 124/49 117/67 122/68   Pulse: 57 64 60   Temp: 98 F (36.7 C) 97.2 F (36.2 C) 97.7 F (36.5 C)   TempSrc: Oral Oral Oral   Resp: 18 18 18    Height:      Weight:    116 lb 12.8 oz (52.98 kg)  SpO2: 100% 99% 99%     Intake/Output Summary (Last 24 hours) at 03/22/13 0724 Last data filed at 03/22/13 0500  Gross per 24 hour  Intake    240 ml  Output    251 ml  Net    -11 ml    LABS: Basic Metabolic Panel:  Recent Labs  47/82/95 1416  NA 138  K 4.5  CL 107  CO2 21  GLUCOSE 78  BUN 10  CREATININE 0.80  CALCIUM 9.3   CBC:  Recent Labs  03/21/13 1416  WBC 5.1  NEUTROABS 2.3  HGB 11.2*  HCT 34.1*  MCV 79.3  PLT 219     PHYSICAL EXAM Well appearing 72 yo woman, NAD HEENT: Unremarkable Neck:  7 cm JVD, no thyromegally Back:  No CVA tenderness Lungs:  Clear with no wheezes HEART:  Regular brady rhythm, no murmurs, no rubs, no clicks Abd:  Flat, positive bowel sounds, no organomegally, no rebound, no guarding Ext:  2 plus pulses, no edema, no cyanosis, no clubbing Skin:  No rashes no nodules Neuro:  CN II through XII intact, motor grossly intact   DEVICE INTERROGATION: Device interrogated yesterday in the office.  Reveals fracture of 6949 lead.  Battery voltage 2.66 and has been in since 2005.    A/P 1. ICD lead fracture 2. ICD near ERI 3. Sinus bradycardia 4. ICM, s/p CABG Rec: I have discussed the treatment options with the patient  and the risks/benefits/goals/and expectations of RV lead revision, insertion of a new RA lead and a new device have been discussed and she wishes to proceed.  Leonia Reeves.D.

## 2013-03-23 ENCOUNTER — Inpatient Hospital Stay (HOSPITAL_COMMUNITY): Payer: Medicare Other

## 2013-03-23 MED ORDER — HYDROCODONE-ACETAMINOPHEN 5-325 MG PO TABS: 1.0000 | ORAL_TABLET | Freq: Four times a day (QID) | ORAL | Status: DC | PRN

## 2013-03-23 NOTE — Discharge Summary (Signed)
ELECTROPHYSIOLOGY PROCEDURE DISCHARGE SUMMARY    Patient ID: [name],  MRN: [id number], DOB/AGE: [birth year] 72 y.o.  Admit date: 03/21/2013 Discharge date: 03/23/2013  Primary Care Physician: Loreen Freud, MD Primary Cardiologist: Sherryl Manges, MD  Primary Discharge Diagnosis:  Fractured [phone] lead s/p ICD system revision this admission  Secondary Discharge Diagnosis:  1.  Ischemic cardiomyopathy-Anterior MI 2005 with primary PCI: cath 2013Cardiac catheterization showed a heavily calcified left main with a long 95% stenosis. The LAD had 80% proximal stenosis. The right coronary was a large dominant vessel with proximal 70% stenosis and 50% distal stenosis before the bifurcation. Left ventricular ejection fraction was 35% with akinesis of the anterior apical wall  2.  Sinus bradycardia 3.  PSVT  Procedures This Admission:  1.  Insertion of a new right ventricular defibrillation lead, insertion of a new atrial pacing lead, removal of  an old ICD, and insertion of a new ICD with revision of the ICD pocket and defibrillation threshold testing on 03-22-2013 by Dr Ladona Ridgel.  The patient received a MDT Evera XT ICD with model number 5076 right ventricular lead and model number 6935 right ventricular lead.  DFT's were successful at 20J.  There were no early apparent complications. 2.  CXR on 03-23-2013 demonstrated no pneumothorax status post device revision.   Brief HPI: 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 ICD interrogation was performed in the office which revealed her RV lead impedance is >3000 and SIC >1300. Her tachy therapies were turned off and she is being directly admitted from the office for RV lead revision.  Hospital Course:  The patient  was admitted and underwent ICD system revision with details as outlined above.   She was monitored on telemetry overnight which demonstrated sinus rhythm.  Left chest was without hematoma or ecchymosis.  The device was interrogated and found to be functioning normally.  CXR was obtained and demonstrated no pneumothorax status post device implantation.  Wound care, arm mobility, and restrictions were reviewed with the patient.  Dr Ladona Ridgel examined the patient and considered them stable for discharge to home.    Discharge Vitals: Blood pressure 113/58, pulse 69, temperature 98.2 F (36.8 C), temperature source Oral, resp. rate 18, height 5\' 2"  (1.575 m), weight 118 lb 1.6 oz (53.57 kg), SpO2 100.00%.    Labs:   Lab Results  Component Value Date   WBC 5.1 03/21/2013   HGB 11.2* 03/21/2013   HCT 34.1* 03/21/2013   MCV 79.3 03/21/2013   PLT 219 03/21/2013    Recent Labs Lab 03/21/13 1416  NA 138  K 4.5  CL 107  CO2 21  BUN 10  CREATININE 0.80  CALCIUM 9.3  GLUCOSE 78    Discharge Medications:    Medication List    TAKE these medications       aspirin EC 81 MG tablet  Take 1 tablet (81 mg total) by mouth daily.     benzonatate 100 MG capsule  Commonly known as:  TESSALON  Take 1 capsule (100 mg total) by mouth 3 (three) times daily as needed for cough.     carvedilol 6.25 MG tablet  Commonly known as:  COREG  Take 1 tablet (6.25 mg total) by mouth  2 (two) times daily with a meal.     diclofenac sodium 1 % Gel  Commonly known as:  VOLTAREN  Apply 1 application topically.     freestyle lancets  Check blood sugar TID AC and QHS     gabapentin 100 MG capsule  Commonly known as:  NEURONTIN  Take 100 mg by mouth at bedtime.     glucose blood test strip  Use as instructed     glucose monitoring kit monitoring kit  1 each by Does not apply route as needed for other.     HYDROcodone-acetaminophen 5-325 MG per tablet  Commonly known as:  NORCO/VICODIN  Take 1-2 tablets by mouth  every 6 (six) hours as needed (for moderate-severe pain).     hydrocortisone 25 MG suppository  Commonly known as:  ANUSOL-HC  Place 1 suppository (25 mg total) rectally every 12 (twelve) hours.     hydrocortisone-pramoxine rectal foam  Commonly known as:  PROCTOFOAM-HC  Place 1 applicator rectally 2 (two) times daily.     lisinopril 2.5 MG tablet  Commonly known as:  PRINIVIL,ZESTRIL  Take 1 tablet (2.5 mg total) by mouth daily.     metFORMIN 500 MG tablet  Commonly known as:  GLUCOPHAGE  Take 500 mg by mouth daily with breakfast.     omeprazole 20 MG capsule  Commonly known as:  PRILOSEC  Take 20 mg by mouth 2 (two) times daily.     oxyCODONE 5 MG immediate release tablet  Commonly known as:  Oxy IR/ROXICODONE  Take 1-2 tablets (5-10 mg total) by mouth every 4 (four) hours as needed.     polyethylene glycol packet  Commonly known as:  MIRALAX / GLYCOLAX  Take 17 g by mouth daily.     simvastatin 20 MG tablet  Commonly known as:  ZOCOR  Take 20 mg by mouth at bedtime.        Disposition:      Discharge Orders   Future Appointments Provider Department Dept Phone   04/02/2013 10:30 AM [facility] [employer] Main Office [place]) [phone]   06/26/2013 9:30 AM Duke Salvia, MD [facility] Main Office [place]) [phone]   Future Orders Complete By Expires     Diet - low sodium heart healthy  As directed     Increase activity slowly  As directed     Comments:      Please see attached sheet at the end of your After-Visit Summary for instructions on wound care, activity, and bathing.      Follow-up Information   Follow up with [facility]. (Wound check on 04/02/13 at 10:30am)    Contact information:   [address] [place] [address] [phone]      Follow up with Sherryl Manges, MD. (06/26/13 at 9:30am)    Contact information:   [address] [place] [address] [phone]       Duration of  Discharge Encounter: Greater than 30 minutes including physician time.  Signed, Gypsy Balsam, RN, BSN 03/23/2013, 9:43 AM

## 2013-03-23 NOTE — Op Note (Signed)
NAMEANNAMAY, LAYMON NO.:  [id number]  MEDICAL RECORD NO.:  [id number]  LOCATION:  2013                         FACILITY:  [facility]  PHYSICIAN:  Doylene Canning. Ladona Ridgel, MD    DATE OF BIRTH:  10-24-1940  DATE OF PROCEDURE:  03/22/2013 DATE OF DISCHARGE:                              OPERATIVE REPORT   PROCEDURE PERFORMED:  Insertion of a new right ventricular defibrillation lead, insertion of a new atrial pacing lead, removal of an old ICD, and insertion of a new ICD with revision of the ICD pocket and defibrillation threshold testing.  INTRODUCTION:  The patient is a 72 year old woman who was found to have a broken 6949 Medtronic lead.  She also was found to have sinus bradycardia with heart rates in the mid 40s.  She was admitted to the hospital and she now presents for revision of her ICD system.  PROCEDURE:  After informed consent was obtained, the patient was taken to the diagnostic EP lab in a fasting state.  After usual preparation and draping, intravenous fentanyl and midazolam was given for sedation. 30 mL of lidocaine was infiltrated into the left infraclavicular region. A 7-cm incision was carried out over this region.  Electrocautery was utilized to dissect down to the fascial plane.  The left subclavian vein was punctured x2 and the Medtronic model 6935, 55 cm active fixation defibrillation lead, serial number [id number] was advanced into the right ventricle.  The Medtronic model 5076 45 cm active fixation pacing lead, serial number [id number] was advanced to the right atrium. Mapping was first carried out in the right ventricle at the final site on the RV apical septum, the R-waves measured 12 mV and the pacing impedance was 500 ohms with a pacing threshold was 0.5 V at 0.4 milliseconds with the lead actively fixed.  10 V pacing not stimulate the diaphragm.  There was a large injury current.  With the right ventricular lead in satisfactory position,  attention was then turned to the atrial lead.  The atrial lead was placed in the anterolateral portion of the right atrium where P-waves measured 1.5 mV.  The pacing impedance was 500 ohms and threshold 0.75 V at 0.4 milliseconds.  With these satisfactory parameters, the leads were secured to the subpectoral fascia with a figure-of-eight silk suture and the sewing sleeve was secured with silk suture.  At this point, electrocautery was utilized to enter the subcutaneous pocket.  There was dense fibrous scar tissue in the pocket.  The old [phone] lead was freed up from its dense fibrous adhesions.  At this point, the pocket was irrigated with antibiotic irrigation.  At this point, blunt dissection and electrocautery was utilized to make a subpectoral pocket by dissecting through the pectoralis major.  At this point, the pocket was irrigated.  The electrocautery was utilized to assure hemostasis.  The new Medtronic Evera XT DR dual-chamber ICD was connected to the new atrial and RV lead.  The device was placed back in the subpectoral pocket.  The old defibrillation lead was capped and placed in the subpectoral pocket. The pocket was irrigated.  The subpectoral major muscle was reapproximated with Vicryl suture.  The pocket was  again irrigated and the incision was closed with 2-0 and 3-0 Vicryl suture.  Benzoin and Steri-Strips were painted on the skin, pressure dressing was applied, and the patient was returned to her room in satisfactory condition. Prior to return to his room, defibrillation threshold testing was carried out.  I scrubbed out of the case.  Under my direct supervision, the patient was more deeply sedated with intravenous fentanyl and Versed, VF was induced with a T-wave shock.  A 20-joule shock was utilized to terminate ventricular fibrillation and restore sinus rhythm. No additional defibrillation threshold testing was carried out and the patient returned to her room in a  satisfactory condition.  COMPLICATIONS:  There were no immediate procedure complications.  RESULTS:  This demonstrates successful implantation of a new Medtronic defibrillator with new defibrillator lead as well as atrial pacing lead, removal of an old ICD with subpectoral pocket revision and defibrillation threshold testing.     Doylene Canning. Ladona Ridgel, MD     GWT/MEDQ  D:  03/22/2013  T:  03/23/2013  Job:  [id number]

## 2013-03-23 NOTE — Progress Notes (Signed)
Pt discharged to home, driven by granddaughter. All new meds explained to pt and g-daughter, follow up appointments made and gone over. Rx for vicodin given to pt. All questions answered.   Delynn Flavin, RN, BSN

## 2013-03-23 NOTE — Progress Notes (Signed)
   ELECTROPHYSIOLOGY ROUNDING NOTE    Patient Name: [name] Date of Encounter: 03-23-2013    SUBJECTIVE:Patient with incisional soreness this morning.  No chest pain or shortness of breath. S/p ICD system revision 03-22-2013  TELEMETRY: Reviewed telemetry pt in sinus rhythm with occasional atrial pacing, short run NSVT Filed Vitals:   03/22/13 1745 03/22/13 1810 03/22/13 2108 03/22/13 2310  BP: 142/76 148/72 148/72 103/66  Pulse:   60 66  Temp:   98.1 F (36.7 C) 98 F (36.7 C)  TempSrc:   Oral Oral  Resp:   18 18  Height:      Weight:      SpO2:   100% 97%    Intake/Output Summary (Last 24 hours) at 03/23/13 0617 Last data filed at 03/22/13 0730  Gross per 24 hour  Intake      0 ml  Output    330 ml  Net   -330 ml    LABS: Basic Metabolic Panel:  Recent Labs  40/98/11 1416  NA 138  K 4.5  CL 107  CO2 21  GLUCOSE 78  BUN 10  CREATININE 0.80  CALCIUM 9.3   CBC:  Recent Labs  03/21/13 1416  WBC 5.1  NEUTROABS 2.3  HGB 11.2*  HCT 34.1*  MCV 79.3  PLT 219    Radiology/Studies:  Pending  PHYSICAL EXAM Left chest without hematoma or ecchymosis.   DEVICE INTERROGATION: Device interrogation pending     A/P 1. ICD lead fracture 2. S/p ICD system revision with insertion of a new ICD lead. Rec: ok for discharge home. Usual followup.  Leonia Reeves.D.

## 2013-04-02 ENCOUNTER — Encounter: Payer: Self-pay | Admitting: Internal Medicine

## 2013-04-02 ENCOUNTER — Ambulatory Visit (INDEPENDENT_AMBULATORY_CARE_PROVIDER_SITE_OTHER): Payer: Medicare Other | Admitting: *Deleted

## 2013-04-02 DIAGNOSIS — I471 Supraventricular tachycardia, unspecified: Secondary | ICD-10-CM

## 2013-04-02 LAB — ICD DEVICE OBSERVATION
AL AMPLITUDE: 2 mv
AL IMPEDENCE ICD: 475 Ohm
HV IMPEDENCE: 42 Ohm
RV LEAD IMPEDENCE ICD: 456 Ohm
TZAT-0001FASTVT: 1
TZAT-0001SLOWVT: 2
TZAT-0001SLOWVT: 4
TZAT-0018FASTVT: NEGATIVE
TZAT-0018SLOWVT: NEGATIVE
TZAT-0018SLOWVT: NEGATIVE
TZAT-0018SLOWVT: NEGATIVE
TZST-0001FASTVT: 2
TZST-0001FASTVT: 4
TZST-0001SLOWVT: 5
TZST-0001SLOWVT: 7
TZST-0003FASTVT: 35 J
TZST-0003FASTVT: 35 J
TZST-0003FASTVT: 35 J
TZST-0003FASTVT: 35 J
TZST-0003SLOWVT: 35 J
TZST-0003SLOWVT: 35 J
TZST-0003SLOWVT: 35 J

## 2013-04-02 NOTE — Progress Notes (Signed)
Dual chamber icd in clinic.

## 2013-04-03 ENCOUNTER — Other Ambulatory Visit: Payer: Self-pay

## 2013-04-05 ENCOUNTER — Other Ambulatory Visit: Payer: Self-pay

## 2013-04-05 MED ORDER — HYDROCORTISONE 2.5 % RE CREA: TOPICAL_CREAM | Freq: Two times a day (BID) | RECTAL | Status: DC

## 2013-04-05 MED ORDER — HYDROCORTISONE ACE-PRAMOXINE 1-1 % RE FOAM: 1.0000 | Freq: Two times a day (BID) | RECTAL | Status: DC

## 2013-04-07 NOTE — Progress Notes (Signed)
REVIEWED.  

## 2013-05-17 ENCOUNTER — Other Ambulatory Visit: Payer: Self-pay | Admitting: *Deleted

## 2013-05-17 MED ORDER — CARVEDILOL 6.25 MG PO TABS: 6.2500 mg | ORAL_TABLET | Freq: Two times a day (BID) | ORAL | Status: DC

## 2013-06-07 ENCOUNTER — Encounter: Payer: Medicare Other | Admitting: Internal Medicine

## 2013-06-25 ENCOUNTER — Encounter: Payer: Self-pay | Admitting: Internal Medicine

## 2013-06-25 ENCOUNTER — Ambulatory Visit (INDEPENDENT_AMBULATORY_CARE_PROVIDER_SITE_OTHER): Payer: Medicare Other | Admitting: Internal Medicine

## 2013-06-25 VITALS — BP 120/76 | HR 61 | Ht 59.0 in | Wt 120.2 lb

## 2013-06-25 DIAGNOSIS — I2589 Other forms of chronic ischemic heart disease: Secondary | ICD-10-CM

## 2013-06-25 DIAGNOSIS — Z95810 Presence of automatic (implantable) cardiac defibrillator: Secondary | ICD-10-CM

## 2013-06-25 DIAGNOSIS — I498 Other specified cardiac arrhythmias: Secondary | ICD-10-CM

## 2013-06-25 DIAGNOSIS — I5022 Chronic systolic (congestive) heart failure: Secondary | ICD-10-CM

## 2013-06-25 DIAGNOSIS — R001 Bradycardia, unspecified: Secondary | ICD-10-CM

## 2013-06-25 LAB — ICD DEVICE OBSERVATION
AL IMPEDENCE ICD: 418 Ohm
AL THRESHOLD: 0.75 V
DEV-0020ICD: NEGATIVE
RV LEAD AMPLITUDE: 13.5 mv
RV LEAD THRESHOLD: 1.5 V
TZAT-0001FASTVT: 1
TZAT-0001SLOWVT: 1
TZAT-0001SLOWVT: 2
TZAT-0001SLOWVT: 4
TZAT-0018SLOWVT: NEGATIVE
TZAT-0018SLOWVT: NEGATIVE
TZAT-0018SLOWVT: NEGATIVE
TZON-0003FASTVT: 300 ms
TZST-0003FASTVT: 30 J
TZST-0003FASTVT: 35 J
TZST-0003FASTVT: 35 J
TZST-0003SLOWVT: 35 J
TZST-0003SLOWVT: 35 J
VENTRICULAR PACING ICD: 0.1 pct

## 2013-06-25 NOTE — Assessment & Plan Note (Signed)
contienu current meds  

## 2013-06-25 NOTE — Assessment & Plan Note (Signed)
she patient's device was interrogated and the information was fully reviewed.  The device was reprogrammed to maximize longevity

## 2013-06-25 NOTE — Assessment & Plan Note (Signed)
euvolemic 

## 2013-06-25 NOTE — Assessment & Plan Note (Signed)
Stable post pacing 

## 2013-06-25 NOTE — Progress Notes (Signed)
Patient Care Team: Ignatius Specking, MD as PCP - General (Internal Medicine) West Bali, MD as Attending Physician (Gastroenterology)   HPI  [name] is a 72 y.o. female Seen in followup for congestive heart failure in the setting of ischemic cardiomyopathy with previously implanted ICD. She was developing chest pain in the fall. Myoview scan was abnormal. She underwent catheterization. This demonstrated 70% proximal right 80% proximal LAD and a 95% left main. EF was 35% and she underwent bypass surgery    Of June 2014 per 6949 lead fractured requiring replacement. At that time she received a new atrial lead versus bradycardia and a new generator.  The patient denies chest pain, shortness of breath, nocturnal dyspnea, orthopnea or peripheral edema.  There have been no palpitations, lightheadedness or syncope.   There is some discomfort at device site and sternum  Past Medical History  Diagnosis Date  . PSVT (paroxysmal supraventricular tachycardia)   . Ischemic cardiomyopathy     Anterior MI 2005 with primary PCI: cath 2013Cardiac catheterization showed a heavily calcified left main with a long 95% stenosis. The LAD had 80% proximal stenosis. The right coronary was a large dominant vessel with proximal 70% stenosis and 50% distal stenosis before the bifurcation. Left ventricular ejection fraction was 35% with akinesis of the anterior apical wall  . Chronic systolic heart failure   . 4782 lead 09/06/2011  . Coronary artery disease   . History of blood transfusion     "after heart surgery" (03/21/2013)  . GERD (gastroesophageal reflux disease)   . Myocardial infarction 02/18/2004    [name] 02/18/2004 (03/20/2013)  . Automatic implantable cardioverter-defibrillator in situ 06/24/2004    [name] 06/24/2004 (03/21/2013)    Past Surgical History  Procedure Laterality Date  . Cardiac defibrillator placement      Medtronic  . Intraoperative transesophageal echocardiogram  11/03/2012   Procedure: INTRAOPERATIVE TRANSESOPHAGEAL ECHOCARDIOGRAM;  Surgeon: Alleen Borne, MD;  Location: [facility];  Service: Open Heart Surgery;  Laterality: N/A;  . Coronary artery bypass graft  11/03/2012    Procedure: CORONARY ARTERY BYPASS GRAFTING (CABG);  Surgeon: Alleen Borne, MD;  Location: [facility] OR;  Service: Open Heart Surgery;  Laterality: N/A;  . Cataract extraction w/ intraocular lens  implant, bilateral Bilateral ~2008  . Knee arthroscopy Left 02/19/2010    [name] 02/19/2010 (03/21/2013)  . Coronary angioplasty  02/18/2004    [name] 02/18/2004 (03/21/2013)    Current Outpatient Prescriptions  Medication Sig Dispense Refill  . aspirin EC 81 MG tablet Take 1 tablet (81 mg total) by mouth daily.      . benzonatate (TESSALON) 100 MG capsule Take 1 capsule (100 mg total) by mouth 3 (three) times daily as needed for cough.  20 capsule  0  . carvedilol (COREG) 6.25 MG tablet Take 1 tablet (6.25 mg total) by mouth 2 (two) times daily with a meal.  60 tablet  1  . clopidogrel (PLAVIX) 75 MG tablet Take 75 mg by mouth daily.      . diclofenac sodium (VOLTAREN) 1 % GEL Apply 1 application topically.        [name] Kitchen glucose blood test strip Use as instructed  100 each  12  . glucose monitoring kit (FREESTYLE) monitoring kit 1 each by Does not apply route as needed for other.  1 each  0  . hydrocortisone (ANUSOL-HC) 2.5 % rectal cream Place rectally 2 (two) times daily.  30 g  0  . hydrocortisone (ANUSOL-HC) 25 MG suppository Place  1 suppository (25 mg total) rectally every 12 (twelve) hours.  20 suppository  0  . Lancets (FREESTYLE) lancets Check blood sugar TID AC and QHS  100 each  12  . lisinopril (PRINIVIL,ZESTRIL) 2.5 MG tablet Take 1 tablet (2.5 mg total) by mouth daily.  90 tablet  3  . metFORMIN (GLUCOPHAGE) 500 MG tablet Take 500 mg by mouth daily with breakfast.      . omeprazole (PRILOSEC) 20 MG capsule Take 20 mg by mouth 2 (two) times daily.        . simvastatin (ZOCOR) 20 MG tablet Take 20 mg by mouth at  bedtime.      . gabapentin (NEURONTIN) 100 MG capsule Take 100 mg by mouth at bedtime.         No current facility-administered medications for this visit.    No Known Allergies  Review of Systems negative except from HPI and PMH  Physical Exam BP 120/76  Pulse 61  Ht 4\' 11"  (1.499 m)  Wt 120 lb 3.2 oz (54.522 kg)  BMI 24.26 kg/m2 Well developed and well nourished in no acute distress HENT normal E scleral and icterus clear Neck Supple JVP flat; carotids brisk and full Clear to ausculation Device pocket well healed; without hematoma or erythema.  There is no tethering Regular rate and rhythm, no murmurs gallops or rub Soft with active bowel sounds No clubbing cyanosis none Edema Alert and oriented, grossly normal motor and sensory function Skin Warm and Dry  ecg atrial pacing .[birth year]   Assessment and  Plan

## 2013-06-25 NOTE — Patient Instructions (Addendum)
Your physician recommends that you continue on your current medications as directed. Please refer to the Current Medication list given to you today.  Your physician wants you to follow-up in: 6 months with Dr. Graciela Husbands. You will receive a reminder letter in the mail two months in advance. If you don't receive a letter, please call our office to schedule the follow-up appointment.  Your physician recommends that you schedule a follow-up appointment in: 3 months with device clinic

## 2013-06-26 ENCOUNTER — Encounter: Payer: Medicare Other | Admitting: Internal Medicine

## 2013-07-18 ENCOUNTER — Other Ambulatory Visit: Payer: Self-pay

## 2013-07-18 MED ORDER — CARVEDILOL 6.25 MG PO TABS: 6.2500 mg | ORAL_TABLET | Freq: Two times a day (BID) | ORAL | Status: DC

## 2013-07-23 ENCOUNTER — Encounter: Payer: Self-pay | Admitting: Internal Medicine

## 2013-08-03 ENCOUNTER — Telehealth: Payer: Self-pay | Admitting: Internal Medicine

## 2013-08-03 NOTE — Telephone Encounter (Signed)
New message    Son is calling stating someone called regarding an appt for his mother for next week or early nov.

## 2013-10-01 ENCOUNTER — Encounter (INDEPENDENT_AMBULATORY_CARE_PROVIDER_SITE_OTHER): Payer: Self-pay

## 2013-10-01 ENCOUNTER — Ambulatory Visit (INDEPENDENT_AMBULATORY_CARE_PROVIDER_SITE_OTHER): Payer: Medicare Other | Admitting: *Deleted

## 2013-10-01 ENCOUNTER — Encounter: Payer: Self-pay | Admitting: Internal Medicine

## 2013-10-01 DIAGNOSIS — I471 Supraventricular tachycardia: Secondary | ICD-10-CM

## 2013-10-01 DIAGNOSIS — I5022 Chronic systolic (congestive) heart failure: Secondary | ICD-10-CM

## 2013-10-02 LAB — MDC_IDC_ENUM_SESS_TYPE_INCLINIC
Battery Remaining Longevity: 127 mo
Brady Statistic AP VP Percent: 0.03 %
Brady Statistic RA Percent Paced: 20.99 %
Brady Statistic RV Percent Paced: 0.04 %
Date Time Interrogation Session: 20141215172455
Lead Channel Impedance Value: 456 Ohm
Lead Channel Impedance Value: 456 Ohm
Lead Channel Pacing Threshold Amplitude: 1 V
Lead Channel Pacing Threshold Pulse Width: 0.4 ms
Lead Channel Pacing Threshold Pulse Width: 0.4 ms
Lead Channel Setting Pacing Amplitude: 2.5 V
Lead Channel Setting Sensing Sensitivity: 0.3 mV
Zone Setting Detection Interval: 240 ms
Zone Setting Detection Interval: 350 ms
Zone Setting Detection Interval: 450 ms

## 2013-10-02 NOTE — Progress Notes (Signed)
ICD check in clinic. Normal device function. Thresholds and sensing consistent with previous device measurements. Impedance trends stable over time. 4 nst episodes. No mode switches. Histogram distribution appropriate for patient and level of activity. Turned on other 1:1 SVT and changed max lead impedances for LIA. Device programmed at appropriate safety margins--changed Ra output from 1.50 to 2.00 V and rV output from 2.00 to 2.50 V. Device programmed to optimize intrinsic conduction. Estimated longevity 10.5 years.  Alert tones/vibration demonstrated for patient and pt aware to contact office if heard. ROV 01-03-14 @ 1130 with SK.

## 2013-10-20 ENCOUNTER — Other Ambulatory Visit: Payer: Self-pay | Admitting: Internal Medicine

## 2014-01-03 ENCOUNTER — Encounter: Payer: Self-pay | Admitting: Internal Medicine

## 2014-01-03 ENCOUNTER — Ambulatory Visit (INDEPENDENT_AMBULATORY_CARE_PROVIDER_SITE_OTHER): Payer: Medicare Other | Admitting: Internal Medicine

## 2014-01-03 VITALS — BP 137/79 | HR 79 | Ht <= 58 in | Wt 125.0 lb

## 2014-01-03 DIAGNOSIS — I5022 Chronic systolic (congestive) heart failure: Secondary | ICD-10-CM

## 2014-01-03 DIAGNOSIS — I2589 Other forms of chronic ischemic heart disease: Secondary | ICD-10-CM

## 2014-01-03 DIAGNOSIS — I255 Ischemic cardiomyopathy: Secondary | ICD-10-CM

## 2014-01-03 DIAGNOSIS — Z95810 Presence of automatic (implantable) cardiac defibrillator: Secondary | ICD-10-CM

## 2014-01-03 LAB — MDC_IDC_ENUM_SESS_TYPE_INCLINIC
Battery Voltage: 3.03 V
Brady Statistic AS VP Percent: 0.01 %
Brady Statistic RA Percent Paced: 21.64 %
Brady Statistic RV Percent Paced: 0.04 %
Date Time Interrogation Session: 20150319121714
HIGH POWER IMPEDANCE MEASURED VALUE: 190 Ohm
HighPow Impedance: 53 Ohm
Lead Channel Impedance Value: 456 Ohm
Lead Channel Impedance Value: 456 Ohm
Lead Channel Pacing Threshold Amplitude: 1 V
Lead Channel Pacing Threshold Pulse Width: 0.4 ms
Lead Channel Sensing Intrinsic Amplitude: 13.375 mV
Lead Channel Setting Pacing Amplitude: 2 V
Lead Channel Setting Sensing Sensitivity: 0.3 mV
MDC IDC MSMT BATTERY REMAINING LONGEVITY: 124 mo
MDC IDC MSMT LEADCHNL RA SENSING INTR AMPL: 0.375 mV
MDC IDC MSMT LEADCHNL RA SENSING INTR AMPL: 0.75 mV
MDC IDC MSMT LEADCHNL RV PACING THRESHOLD AMPLITUDE: 0.5 V
MDC IDC MSMT LEADCHNL RV PACING THRESHOLD PULSEWIDTH: 0.4 ms
MDC IDC MSMT LEADCHNL RV SENSING INTR AMPL: 13.5 mV
MDC IDC SET LEADCHNL RV PACING AMPLITUDE: 2.5 V
MDC IDC SET LEADCHNL RV PACING PULSEWIDTH: 0.4 ms
MDC IDC SET ZONE DETECTION INTERVAL: 300 ms
MDC IDC SET ZONE DETECTION INTERVAL: 350 ms
MDC IDC STAT BRADY AP VP PERCENT: 0.03 %
MDC IDC STAT BRADY AP VS PERCENT: 21.61 %
MDC IDC STAT BRADY AS VS PERCENT: 78.35 %
Zone Setting Detection Interval: 240 ms
Zone Setting Detection Interval: 370 ms
Zone Setting Detection Interval: 450 ms

## 2014-01-03 LAB — SEDIMENTATION RATE: Sed Rate: 25 mm/hr — ABNORMAL HIGH (ref 0–22)

## 2014-01-03 NOTE — Progress Notes (Signed)
Patient Care Team: [name], MD as PCP - General (Internal Medicine) Danie Binder, MD as Attending Physician (Gastroenterology)   HPI  [name] is a 73 y.o. female Seen in followup for congestive heart failure in the setting of ischemic cardiomyopathy with previously implanted ICD. She was developing chest pain in the fall. Myoview scan was abnormal. She underwent catheterization. This demonstrated 70% proximal right 80% proximal LAD and a 95% left main. EF was 35% and she underwent bypass surgery  Of June 2014 her 6949 lead fractured requiring replacement. At that time she received a new atrial lead 2/2 bradycardia and a new generator.   The patient denies chest pain, shortness of breath, nocturnal dyspnea, orthopnea or peripheral edema. There have been no palpitations, lightheadedness or syncope.  There is some discomfort at device site and sternum   She complains of aching in her arms and legs    Past Medical History  Diagnosis Date  . PSVT (paroxysmal supraventricular tachycardia)   . Ischemic cardiomyopathy     Anterior MI 2005 with primary PCI: cath 2013Cardiac catheterization showed a heavily calcified left main with a long 95% stenosis. The LAD had 80% proximal stenosis. The right coronary was a large dominant vessel with proximal 70% stenosis and 50% distal stenosis before the bifurcation. Left ventricular ejection fraction was 35% with akinesis of the anterior apical wall  . Chronic systolic heart failure   . 3785 lead 09/06/2011  . Coronary artery disease   . History of blood transfusion     "after heart surgery" (03/21/2013)  . GERD (gastroesophageal reflux disease)   . Myocardial infarction 02/18/2004    Archie Endo 02/18/2004 (03/20/2013)  . Automatic implantable cardioverter-defibrillator in situ 06/24/2004    Archie Endo 06/24/2004 (03/21/2013)    Past Surgical History  Procedure Laterality Date  . Cardiac defibrillator placement      Medtronic  . Intraoperative  transesophageal echocardiogram  11/03/2012    Procedure: INTRAOPERATIVE TRANSESOPHAGEAL ECHOCARDIOGRAM;  Surgeon: Gaye Pollack, MD;  Location: [facility] OR;  Service: Open Heart Surgery;  Laterality: N/A;  . Coronary artery bypass graft  11/03/2012    Procedure: CORONARY ARTERY BYPASS GRAFTING (CABG);  Surgeon: Gaye Pollack, MD;  Location: [place];  Service: Open Heart Surgery;  Laterality: N/A;  . Cataract extraction w/ intraocular lens  implant, bilateral Bilateral ~2008  . Knee arthroscopy Left 02/19/2010    Archie Endo 02/19/2010 (03/21/2013)  . Coronary angioplasty  02/18/2004    Archie Endo 02/18/2004 (03/21/2013)    Current Outpatient Prescriptions  Medication Sig Dispense Refill  . aspirin EC 81 MG tablet Take 1 tablet (81 mg total) by mouth daily.      . benzonatate (TESSALON) 100 MG capsule Take 1 capsule (100 mg total) by mouth 3 (three) times daily as needed for cough.  20 capsule  0  . carvedilol (COREG) 6.25 MG tablet Take 1 tablet (6.25 mg total) by mouth 2 (two) times daily with a meal.  60 tablet  6  . clopidogrel (PLAVIX) 75 MG tablet TAKE ONE (1) TABLET EACH DAY  30 tablet  6  . diclofenac sodium (VOLTAREN) 1 % GEL Apply 1 application topically.        . gabapentin (NEURONTIN) 100 MG capsule Take 100 mg by mouth at bedtime.        [name] Kitchen glucose blood test strip Use as instructed  100 each  12  . glucose monitoring kit (FREESTYLE) monitoring kit 1 each by Does not apply  route as needed for other.  1 each  0  . hydrocortisone (ANUSOL-HC) 2.5 % rectal cream Place rectally 2 (two) times daily.  30 g  0  . hydrocortisone (ANUSOL-HC) 25 MG suppository Place 1 suppository (25 mg total) rectally every 12 (twelve) hours.  20 suppository  0  . Lancets (FREESTYLE) lancets Check blood sugar TID AC and QHS  100 each  12  . lisinopril (PRINIVIL,ZESTRIL) 2.5 MG tablet TAKE ONE (1) TABLET BY MOUTH EVERY DAY  90 tablet  1  . metFORMIN (GLUCOPHAGE) 500 MG tablet Take 500 mg by mouth daily with breakfast.      . omeprazole  (PRILOSEC) 20 MG capsule Take 20 mg by mouth daily.      . simvastatin (ZOCOR) 20 MG tablet Take 20 mg by mouth at bedtime.       No current facility-administered medications for this visit.    No Known Allergies  Review of Systems negative except from HPI and PMH  Physical Exam BP 137/79  Pulse 79  Ht $R'4\' 10"'oY$  (1.473 m)  Wt 125 lb (56.7 kg)  BMI 26.13 kg/m2 Well developed and well nourished in no acute distress HENT normal E scleral and icterus clear Neck Supple JVP flat; carotids brisk and full Clear to ausculation *Regular rate and rhythm, no murmurs gallops or rub Soft with active bowel sounds No clubbing cyanosis  Edema Alert and oriented, grossly normal motor and sensory function Skin Warm and Dry    Assessment and  Plan  Ischemic cardiomyopathy  CRT-D  Systolic heart failure   Myalgias  From cardiac point of view, she is doing well  Will continue curent cardiac meds  Her myalgias are worrisome for polymyalgia rheumatica so will check ESR

## 2014-01-03 NOTE — Patient Instructions (Signed)
Your physician recommends that you return for lab work today: Sed rate  Your physician recommends that you continue on your current medications as directed. Please refer to the Current Medication list given to you today.  Your physician recommends that you schedule a follow-up appointment in: 3 months with device clinic.  Your physician wants you to follow-up in: 6 months with Dr. [name]. You will receive a reminder letter in the mail two months in advance. If you don't receive a letter, please call our office to schedule the follow-up appointment.

## 2014-02-13 ENCOUNTER — Telehealth: Payer: Self-pay

## 2014-02-13 NOTE — Telephone Encounter (Addendum)
Pt would like to be seen in 2 weeks for some rectal bleeding and burning. She would like it around or after lunch. Please advise.  Her call back number is [phone]. The inter's number is [phone].

## 2014-02-14 NOTE — Telephone Encounter (Signed)
This is the right patient. This is the mother. She will be coming on May 6 @ 1115 to see SLF

## 2014-02-14 NOTE — Telephone Encounter (Signed)
REVIEWED. PT WANTS APPT FOR HER MOTHER.

## 2014-02-15 ENCOUNTER — Other Ambulatory Visit: Payer: Self-pay | Admitting: Internal Medicine

## 2014-02-20 ENCOUNTER — Encounter: Payer: Self-pay | Admitting: Gastroenterology

## 2014-02-20 ENCOUNTER — Ambulatory Visit (INDEPENDENT_AMBULATORY_CARE_PROVIDER_SITE_OTHER): Payer: Medicare Other | Admitting: Gastroenterology

## 2014-02-20 ENCOUNTER — Other Ambulatory Visit: Payer: Self-pay | Admitting: Gastroenterology

## 2014-02-20 VITALS — BP 120/67 | HR 73 | Temp 97.6°F | Ht 59.0 in | Wt 128.2 lb

## 2014-02-20 DIAGNOSIS — I2589 Other forms of chronic ischemic heart disease: Secondary | ICD-10-CM

## 2014-02-20 DIAGNOSIS — K625 Hemorrhage of anus and rectum: Secondary | ICD-10-CM

## 2014-02-20 MED ORDER — HYDROCORTISONE ACETATE 25 MG RE SUPP: 25.0000 mg | Freq: Two times a day (BID) | RECTAL | Status: DC

## 2014-02-20 NOTE — Assessment & Plan Note (Signed)
Sx most likely due to hemorrhoids, LESS LIKELY RECTAL CA OR POLYP. Has NEVER had a TCS.   TCS MAY 12-MOVIPREP, FULL LIQUIDS. DISCUSSED TCS, BENEFITS AND RISKS VIA INTERPRETER. PT WOULD LIKE MORE SUPP FOLLOW UP IN 1 MOS.

## 2014-02-20 NOTE — Patient Instructions (Signed)
FULL LIQUID DIET MAY 11. SEE INFO BELOW.  COLONOSCOPY MAY 12.  FOLLOW UP IN 1 MOS.    Full Liquid Diet   Breads and Starches  Allowed: None are allowed except crackersWHOLE OR pureed (made into a thick, smooth soup) in soup.   Avoid: Any others.       Vegetables  Allowed: Strained tomato or vegetable juice. Vegetables pureed in soup.   Avoid: Any others.    Fruit  Allowed: Any strained fruit juices and fruit drinks. Include 1 serving of citrus or vitamin C-enriched fruit juice daily.   Avoid: Any others.  Meat and Meat Substitutes  Allowed: Egg  Avoid: Any meat, fish, or fowl. Milk  Allowed: SOY Milk beverages, including milk shakes and instant breakfast mixes. Smooth yogurt.   Avoid: Any others. Avoid dairy products if not tolerated.    Soups and Combination Foods  Allowed: Broth, strained cream soups. Strained, broth-based soups.   Avoid: Any others.    Desserts and Sweets  Allowed: flavored gelatin, plain ice cream, sherbet,  junket, fruit ices, frozen ice pops, pudding pops,, frozen fudge pops, chocolate syrup. Sugar, honey, jelly, syrup.   Avoid: Any others.     Beverages  Allowed: All.   Avoid: None.  Condiments  Allowed: Iodized salt, pepper, spices, flavorings. Cocoa powder.   Avoid: Any others.

## 2014-02-20 NOTE — Progress Notes (Signed)
Subjective:    Patient ID: [name], female    DOB: 1941/03/11, 73 y.o.   MRN: [id number]  [name]., MD  HPI OBTAINED VIA INTERPRETER. DAUGHTER PRESENT.  HPI BURNING AND BLEEDING LAST WEEK. SHE HAD HEMORRHOIDS LAST YEAR. JUST A FEW DROPS OF BLOOD. SHE WAS CONSTIPATED LAST WEEK. TAKING META,UCIL AND IT'S HELPING. NO RECENT PROBLEMS WITH HER HEART. PT STATES SHE DOES NOT HAVE SUGAR. SHE IS NOT TAKING METFORMIN. DOESN'T CHECK HER SUGARS ANYMORE. PT DENIES FEVER, CHILLS, nausea, vomiting, melena, diarrhea, abd pain, problems swallowing, problems with sedation, OR heartburn or indigestion.    Past Medical History  Diagnosis Date  . PSVT (paroxysmal supraventricular tachycardia)   . Ischemic cardiomyopathy     Anterior MI 2005 with primary PCI: cath 2013Cardiac catheterization showed a heavily calcified left main with a long 95% stenosis. The LAD had 80% proximal stenosis. The right coronary was a large dominant vessel with proximal 70% stenosis and 50% distal stenosis before the bifurcation. Left ventricular ejection fraction was 35% with akinesis of the anterior apical wall  . Chronic systolic heart failure   . 2637 lead 09/06/2011  . Coronary artery disease   . History of blood transfusion     "after heart surgery" (03/21/2013)  . GERD (gastroesophageal reflux disease)   . Myocardial infarction 02/18/2004    [name] 02/18/2004 (03/20/2013)  . Automatic implantable cardioverter-defibrillator in situ 06/24/2004    [name] 06/24/2004 (03/21/2013)   Past Surgical History  Procedure Laterality Date  . Cardiac defibrillator placement      Medtronic  . Intraoperative transesophageal echocardiogram  11/03/2012    Procedure: INTRAOPERATIVE TRANSESOPHAGEAL ECHOCARDIOGRAM;  Surgeon: Jennifer Pollack, MD;  Location: [facility] OR;  Service: Open Heart Surgery;  Laterality: N/A;  . Coronary artery bypass graft  11/03/2012    Procedure: CORONARY ARTERY BYPASS GRAFTING (CABG);  Surgeon: Jennifer Pollack, MD;   Location: [place];  Service: Open Heart Surgery;  Laterality: N/A;  . Cataract extraction w/ intraocular lens  implant, bilateral Bilateral ~2008  . Knee arthroscopy Left 02/19/2010    [name] 02/19/2010 (03/21/2013)  . Coronary angioplasty  02/18/2004    [name] 02/18/2004 (03/21/2013)   No Known Allergies  Current Outpatient Prescriptions  Medication Sig Dispense Refill  . aspirin EC 81 MG tablet Take 1 tablet (81 mg total) by mouth daily.    . benzonatate (TESSALON) 100 MG capsule Take 1 capsule (100 mg total) by mouth 3 (three) times daily as needed for cough.    . carvedilol (COREG) 6.25 MG tablet TAKE ONE TABLET BY MOUTH TWICE A DAY WITH A MEAL    . clopidogrel (PLAVIX) 75 MG tablet TAKE ONE (1) TABLET EACH DAY    . diclofenac sodium (VOLTAREN) 1 % GEL Apply 1 application topically.      . hydrocortisone (ANUSOL-HC) 2.5 % rectal cream Place rectally 2 (two) times daily. DONE   . lisinopril (PRINIVIL,ZESTRIL) 2.5 MG tablet TAKE ONE (1) TABLET BY MOUTH EVERY DAY    . omeprazole (PRILOSEC) 20 MG capsule Take 20 mg by mouth daily.      . simvastatin (ZOCOR) 20 MG tablet Take 20 mg by mouth at bedtime.       Family History  Problem Relation Age of Onset  . Colon cancer Neg Hx     History  Substance Use Topics  . Smoking status: Former Research scientist (life sciences)  . Smokeless tobacco: Never Used  . Alcohol Use: No       Review of Systems PER  HPI OTHERWISE ALL SYSTEMS ARE NEGATIVE.     Objective:   Physical Exam  Vitals reviewed. Constitutional: She is oriented to person, place, and time. She appears well-nourished. No distress.  HENT:  Head: Normocephalic and atraumatic.  Mouth/Throat: Oropharynx is clear and moist. No oropharyngeal exudate.  Eyes: Pupils are equal, round, and reactive to light. No scleral icterus.  Neck: Normal range of motion. Neck supple.  Cardiovascular: Normal rate, regular rhythm and normal heart sounds.   Pulmonary/Chest: Breath sounds normal.  Abdominal: Soft. Bowel sounds are  normal. She exhibits no distension. There is no tenderness.  Genitourinary: Rectal exam shows external hemorrhoid. Rectal exam shows no internal hemorrhoid, no fissure, no mass, no tenderness and anal tone normal.  Musculoskeletal: She exhibits no edema.  Lymphadenopathy:    She has no cervical adenopathy.  Neurological: She is alert and oriented to person, place, and time.  NO FOCAL DEFICITS   Psychiatric: She has a normal mood and affect.          Assessment & Plan:

## 2014-02-20 NOTE — Progress Notes (Signed)
cc'd to pcp 

## 2014-02-20 NOTE — Addendum Note (Signed)
Addended by: Danie Binder on: 02/20/2014 12:16 PM   Modules accepted: Orders, Medications

## 2014-02-25 ENCOUNTER — Encounter: Payer: Self-pay | Admitting: Gastroenterology

## 2014-02-25 NOTE — Progress Notes (Signed)
Pt is aware of OV on 6/24 at 10 with SF and appt card mailed

## 2014-02-26 ENCOUNTER — Ambulatory Visit (HOSPITAL_COMMUNITY)
Admission: RE | Admit: 2014-02-26 | Discharge: 2014-02-26 | Disposition: A | Discharge status: Home / Self Care | Payer: Medicare Other | Source: Ambulatory Visit | Attending: Gastroenterology | Admitting: Gastroenterology

## 2014-02-26 ENCOUNTER — Encounter (HOSPITAL_COMMUNITY): Payer: Self-pay

## 2014-02-26 ENCOUNTER — Encounter (HOSPITAL_COMMUNITY)
Admission: RE | Disposition: A | Discharge status: Home / Self Care | Payer: Self-pay | Source: Ambulatory Visit | Attending: Gastroenterology

## 2014-02-26 DIAGNOSIS — Q438 Other specified congenital malformations of intestine: Secondary | ICD-10-CM | POA: Insufficient documentation

## 2014-02-26 DIAGNOSIS — I471 Supraventricular tachycardia, unspecified: Secondary | ICD-10-CM | POA: Insufficient documentation

## 2014-02-26 DIAGNOSIS — K625 Hemorrhage of anus and rectum: Secondary | ICD-10-CM

## 2014-02-26 DIAGNOSIS — K219 Gastro-esophageal reflux disease without esophagitis: Secondary | ICD-10-CM | POA: Insufficient documentation

## 2014-02-26 DIAGNOSIS — I2510 Atherosclerotic heart disease of native coronary artery without angina pectoris: Secondary | ICD-10-CM | POA: Insufficient documentation

## 2014-02-26 DIAGNOSIS — Z7982 Long term (current) use of aspirin: Secondary | ICD-10-CM | POA: Insufficient documentation

## 2014-02-26 DIAGNOSIS — Z79899 Other long term (current) drug therapy: Secondary | ICD-10-CM | POA: Insufficient documentation

## 2014-02-26 DIAGNOSIS — Z7902 Long term (current) use of antithrombotics/antiplatelets: Secondary | ICD-10-CM | POA: Insufficient documentation

## 2014-02-26 DIAGNOSIS — I5022 Chronic systolic (congestive) heart failure: Secondary | ICD-10-CM | POA: Insufficient documentation

## 2014-02-26 DIAGNOSIS — Z951 Presence of aortocoronary bypass graft: Secondary | ICD-10-CM | POA: Insufficient documentation

## 2014-02-26 DIAGNOSIS — Z87891 Personal history of nicotine dependence: Secondary | ICD-10-CM | POA: Insufficient documentation

## 2014-02-26 DIAGNOSIS — K648 Other hemorrhoids: Secondary | ICD-10-CM | POA: Insufficient documentation

## 2014-02-26 DIAGNOSIS — I252 Old myocardial infarction: Secondary | ICD-10-CM | POA: Insufficient documentation

## 2014-02-26 DIAGNOSIS — I2589 Other forms of chronic ischemic heart disease: Secondary | ICD-10-CM | POA: Insufficient documentation

## 2014-02-26 DIAGNOSIS — D126 Benign neoplasm of colon, unspecified: Secondary | ICD-10-CM | POA: Insufficient documentation

## 2014-02-26 DIAGNOSIS — Z800 Family history of malignant neoplasm of digestive organs: Secondary | ICD-10-CM | POA: Insufficient documentation

## 2014-02-26 DIAGNOSIS — Z95810 Presence of automatic (implantable) cardiac defibrillator: Secondary | ICD-10-CM | POA: Insufficient documentation

## 2014-02-26 HISTORY — PX: COLONOSCOPY: SHX5424

## 2014-02-26 SURGERY — COLONOSCOPY
Anesthesia: Moderate Sedation

## 2014-02-26 MED ORDER — STERILE WATER FOR IRRIGATION IR SOLN
Status: DC | PRN
  Administered 2014-02-26: 13:00:00

## 2014-02-26 MED ORDER — SODIUM CHLORIDE 0.9 % IV SOLN
INTRAVENOUS | Status: DC
  Administered 2014-02-26: 12:00:00 via INTRAVENOUS

## 2014-02-26 MED ORDER — MEPERIDINE HCL 100 MG/ML IJ SOLN
INTRAMUSCULAR | Status: DC | PRN
  Administered 2014-02-26 (×2): 25 mg via INTRAVENOUS

## 2014-02-26 MED ORDER — MEPERIDINE HCL 100 MG/ML IJ SOLN
INTRAMUSCULAR | Status: AC
  Filled 2014-02-26: qty 2

## 2014-02-26 MED ORDER — MIDAZOLAM HCL 5 MG/5ML IJ SOLN
INTRAMUSCULAR | Status: AC
  Filled 2014-02-26: qty 10

## 2014-02-26 MED ORDER — MIDAZOLAM HCL 5 MG/5ML IJ SOLN
INTRAMUSCULAR | Status: DC | PRN
  Administered 2014-02-26 (×2): 2 mg via INTRAVENOUS
  Administered 2014-02-26: 1 mg via INTRAVENOUS

## 2014-02-26 NOTE — Discharge Instructions (Signed)
You have internal hemorrhoids. YOU had two POLYPs removed. I PLACED 3 BANDS TO TREAT YOUR HEMORRHOIDAL BLEEDING. YOU MAY SOME MILD BLEEDING OVER THE NEXT 3 TO 5 DAYS.    CALL [phone] IF YOU HAVE A FEVER, A LARGE AMOUNT OF BLEEDING, OR DIFFICULTY URINATING.  DRINK WATER TO KEEP URINE LIGHT YELLOW.  YOU MAY USE NAPROXEN OR IBUPROFEN TWICE DAILY FOR RECTAL DISCOMFORT. TYLENOL AS NEED FOR ADDITIONAL PAIN RELIEF.  IF NEEDED, USE COLACE TWICE DAILY TO SOFTEN STOOL.  YOUR BIOPSY RESULT WILL BE BACK IN 7 DAYS.  FOLLOW A LOW RESIDUE DIET FOR THE NEXT 2 WEEKS. SEE INFO BELOW.  FOLLOW UP IN 3-4 WEEKS.      Colonoscopy Care After Read the instructions outlined below and refer to this sheet in the next week. These discharge instructions provide you with general information on caring for yourself after you leave the hospital. While your treatment has been planned according to the most current medical practices available, unavoidable complications occasionally occur. If you have any problems or questions after discharge, call DR. Jaliel Deavers, [phone].  ACTIVITY  You may resume your regular activity, but move at a slower pace for the next 24 hours.   Take frequent rest periods for the next 24 hours.   Walking will help get rid of the air and reduce the bloated feeling in your belly (abdomen).   No driving for 24 hours (because of the medicine (anesthesia) used during the test).   You may shower.   Do not sign any important legal documents or operate any machinery for 24 hours (because of the anesthesia used during the test).    NUTRITION  Drink plenty of fluids.   You may resume your normal diet as instructed by your doctor.   Begin with a light meal and progress to your normal diet. Heavy or fried foods are harder to digest and may make you feel sick to your stomach (nauseated).   Avoid alcoholic beverages for 24 hours or as instructed.    MEDICATIONS  You may resume your  normal medications.   WHAT YOU CAN EXPECT TODAY  Some feelings of bloating in the abdomen.   Passage of more gas than usual.   Spotting of blood in your stool or on the toilet paper  .  IF YOU HAD POLYPS REMOVED DURING THE COLONOSCOPY:  Eat a soft diet IF YOU HAVE NAUSEA, BLOATING, ABDOMINAL PAIN, OR VOMITING.    FINDING OUT THE RESULTS OF YOUR TEST Not all test results are available during your visit. DR. Oneida Alar WILL CALL YOU WITHIN 7 DAYS OF YOUR PROCEDUE WITH YOUR RESULTS. Do not assume everything is normal if you have not heard from DR. Lailee Hoelzel IN ONE WEEK, CALL HER OFFICE AT [phone].  SEEK IMMEDIATE MEDICAL ATTENTION AND CALL THE OFFICE: [phone] IF:  You have more than a spotting of blood in your stool.   Your belly is swollen (abdominal distention).   You are nauseated or vomiting.   You have a temperature over 101F.   You have abdominal pain or discomfort that is severe or gets worse throughout the day.   HEMORRHOIDAL BANDING COMPLICATIONS:  COMMON: 1. MINOR PAIN  UNCOMMON: 1. ABSCESS  2. BAND FALLS OFF  3. PROLAPSE OF HEMORRHOIDS AND PAIN  4. ULCER BLEEDING  A. USUALLY SELF-LIMITED: MAY LAST 3-5 DAYS  B. MAY REQUIRE INTERVENTION: 1-2 WEEKS AFTER INTERACTIONS  5. NECROTIZING PELVIC SEPSIS  A. SYMPTOMS: FEVER, PAIN, DIFFICULTY URINATING   Hemorrhoids Hemorrhoids are dilated (  enlarged) veins around the rectum. Sometimes clots will form in the veins. This makes them swollen and painful. These are called thrombosed hemorrhoids. Causes of hemorrhoids include:  Constipation.   Straining to have a bowel movement.   HEAVY LIFTING HOME CARE INSTRUCTIONS  Eat a well balanced diet and drink 6 to 8 glasses of water every day to avoid constipation. You may also use a bulk laxative.   Avoid straining to have bowel movements.   Keep anal area dry and clean.   Do not use a donut shaped pillow or sit on the toilet for long periods. This increases  blood pooling and pain.   Move your bowels when your body has the urge; this will require less straining and will decrease pain and pressure.  LOW RESIDUE DIET  A low-residue diet, aka low-fiber diet, is usually recommended. An intake of less than 10 grams of fiber per day is generally considered a low-residue diet.  LOW RESIDUE Diet  Grain Products: enriched refined white bread, buns, bagels, English muffins  plain cereals e.g. Cheerios, Cornflakes, Cream of Wheat, Rice Krispies, Special K  arrowroot cookies, tea biscuits, soda crackers, plain melba toast  white rice, refined pasta and noodles  avoid whole grains   Fruits: fruit juices except prune juice  applesauce, apricots, banana (1/2), cantaloupe, canned fruit cocktail, grapes, honeydew melon, peaches, watermelon  avoid raw and dried fruits, and berries.   Vegetables: vegetable juices  potatoes (no Skin)  alfalfa sprouts, beets, green/yellow beans, carrots, celery, cucumber, eggplant, lettuce, mushrooms, green/red peppers, potatoes (peeled), squash, zucchini  avoid vegetables from the cruciferous family such as broccoli, cauliflower, brussels sprouts, cabbage, kale, Swiss chard, onion, etc   Meat and Protein Choice: well-cooked, tender meat, fish and eggs  avoid beans  avoid all nuts and seeds, as well as foods that may contain seeds (such as yogurt)   Dairy: NO RESTRICTIONS   Drinks: juices  tea and coffee   avoid alcohol

## 2014-02-26 NOTE — Op Note (Addendum)
[facility] [address] [place], [address]   COLONOSCOPY PROCEDURE REPORT  PATIENT: [name], [name]  MR#: [id number] BIRTHDATE: 09/25/1941 , 73  yrs. old GENDER: Female ENDOSCOPIST: Barney Drain, MD REFERRED HA:LPFXT [name], M.D. PROCEDURE DATE:  02/26/2014 PROCEDURE:   Colonoscopy with snare biopsy polypectomy, and Hemorrhoidectomy via banding(3) INDICATIONS:Rectal Bleeding.  DAUGHTER HAD STAGE III RECTAL CA. MEDICATIONS: Demerol 50 mg IV and Versed 5 mg IV  DESCRIPTION OF PROCEDURE:    Physical exam was performed.  Informed consent was obtained from the patient after explaining the benefits, risks, and alternatives to procedure.  The patient was connected to monitor and placed in left lateral position. Continuous oxygen was provided by nasal cannula and IV medicine administered through an indwelling cannula.  After administration of sedation and rectal exam, the patients rectum was intubated and the EC-3890Li ([id number]), EC-3490TLi ([id number]), and EG-[id number] ([id number])  colonoscope was advanced under direct visualization to the ileum.  The scope was removed slowly by carefully examining the color, texture, anatomy, and integrity mucosa on the way out.  The patient was recovered in endoscopy and discharged home in satisfactory condition.    COLON FINDINGS: The mucosa appeared normal in the terminal ileum.  , A sessile polyp measuring 6 mm in size was found at the splenic flexure.  A polypectomy was performed using snare cautery.  , A sessile polyp measuring 4 mm in size was found at the splenic flexure.  A polypectomy was performed with a cold snare.  , The LEFT colon IS redundant.  Manual abdominal counter-pressure was used to reach the cecum.  The patient was moved on to their back to reach the cecum, and Moderate sized internal hemorrhoids were found.  3 BANDS APPLIED.  PREP QUALITY: excellent.  CECAL W/D TIME: 24 minutes COMPLICATIONS:  None  ENDOSCOPIC IMPRESSION: 1.   Normal mucosa in the terminal ileum 2.   TWO COLON POLYPS REMOVED 3.   The LEFT colon IS redundant 5.   RECTAL BLEEDING DUE TO Moderate sized internal hemorrhoids  RECOMMENDATIONS: QQIW [phone] for A FEVER, A LARGE AMOUNT OF BLEEDING, OR DIFFICULTY URINATING.  DRINK WATER TO KEEP URINE LIGHT YELLOW. MAY USE NAPROXEN OR IBUPROFEN TWICE DAILY FOR RECTAL DISCOMFORT. TYLENOL AS NEEDED FOR ADDITIONAL PAIN RELIEF. IF NEEDED, USE COLACE TWICE DAILY TO SOFTEN STOOL. BIOPSY RESULT WILL BE BACK IN 7 DAYS. FOLLOW A LOW RESIDUE DIET FOR THE NEXT 2 WEEKS.  FOLLOW UP IN 3-4 WEEKS.     _______________________________eSigned:  Barney Drain, MD 03/04/2014 11:45 AMRevised: 03/04/2014 11:45 AM

## 2014-02-26 NOTE — H&P (View-Only) (Signed)
Subjective:    Patient ID: [name], female    DOB: 03/02/1941, 73 y.o.   MRN: [id number]  [name], MD  HPI OBTAINED VIA INTERPRETER. DAUGHTER PRESENT.  HPI BURNING AND BLEEDING LAST WEEK. SHE HAD HEMORRHOIDS LAST YEAR. JUST A FEW DROPS OF BLOOD. SHE WAS CONSTIPATED LAST WEEK. TAKING META,UCIL AND IT'S HELPING. NO RECENT PROBLEMS WITH HER HEART. PT STATES SHE DOES NOT HAVE SUGAR. SHE IS NOT TAKING METFORMIN. DOESN'T CHECK HER SUGARS ANYMORE. PT DENIES FEVER, CHILLS, nausea, vomiting, melena, diarrhea, abd pain, problems swallowing, problems with sedation, OR heartburn or indigestion.    Past Medical History  Diagnosis Date  . PSVT (paroxysmal supraventricular tachycardia)   . Ischemic cardiomyopathy     Anterior MI 2005 with primary PCI: cath 2013Cardiac catheterization showed a heavily calcified left main with a long 95% stenosis. The LAD had 80% proximal stenosis. The right coronary was a large dominant vessel with proximal 70% stenosis and 50% distal stenosis before the bifurcation. Left ventricular ejection fraction was 35% with akinesis of the anterior apical wall  . Chronic systolic heart failure   . 3154 lead 09/06/2011  . Coronary artery disease   . History of blood transfusion     "after heart surgery" (03/21/2013)  . GERD (gastroesophageal reflux disease)   . Myocardial infarction 02/18/2004    Archie Endo 02/18/2004 (03/20/2013)  . Automatic implantable cardioverter-defibrillator in situ 06/24/2004    Archie Endo 06/24/2004 (03/21/2013)   Past Surgical History  Procedure Laterality Date  . Cardiac defibrillator placement      Medtronic  . Intraoperative transesophageal echocardiogram  11/03/2012    Procedure: INTRAOPERATIVE TRANSESOPHAGEAL ECHOCARDIOGRAM;  Surgeon: Gaye Pollack, MD;  Location: [facility] OR;  Service: Open Heart Surgery;  Laterality: N/A;  . Coronary artery bypass graft  11/03/2012    Procedure: CORONARY ARTERY BYPASS GRAFTING (CABG);  Surgeon: Gaye Pollack, MD;   Location: [facility];  Service: Open Heart Surgery;  Laterality: N/A;  . Cataract extraction w/ intraocular lens  implant, bilateral Bilateral ~2008  . Knee arthroscopy Left 02/19/2010    Archie Endo 02/19/2010 (03/21/2013)  . Coronary angioplasty  02/18/2004    Archie Endo 02/18/2004 (03/21/2013)   No Known Allergies  Current Outpatient Prescriptions  Medication Sig Dispense Refill  . aspirin EC 81 MG tablet Take 1 tablet (81 mg total) by mouth daily.    . benzonatate (TESSALON) 100 MG capsule Take 1 capsule (100 mg total) by mouth 3 (three) times daily as needed for cough.    . carvedilol (COREG) 6.25 MG tablet TAKE ONE TABLET BY MOUTH TWICE A DAY WITH A MEAL    . clopidogrel (PLAVIX) 75 MG tablet TAKE ONE (1) TABLET EACH DAY    . diclofenac sodium (VOLTAREN) 1 % GEL Apply 1 application topically.      . hydrocortisone (ANUSOL-HC) 2.5 % rectal cream Place rectally 2 (two) times daily. DONE   . lisinopril (PRINIVIL,ZESTRIL) 2.5 MG tablet TAKE ONE (1) TABLET BY MOUTH EVERY DAY    . omeprazole (PRILOSEC) 20 MG capsule Take 20 mg by mouth daily.      . simvastatin (ZOCOR) 20 MG tablet Take 20 mg by mouth at bedtime.       Family History  Problem Relation Age of Onset  . Colon cancer Neg Hx     History  Substance Use Topics  . Smoking status: Former Research scientist (life sciences)  . Smokeless tobacco: Never Used  . Alcohol Use: No       Review of Systems PER  HPI OTHERWISE ALL SYSTEMS ARE NEGATIVE.     Objective:   Physical Exam  Vitals reviewed. Constitutional: She is oriented to person, place, and time. She appears well-nourished. No distress.  HENT:  Head: Normocephalic and atraumatic.  Mouth/Throat: Oropharynx is clear and moist. No oropharyngeal exudate.  Eyes: Pupils are equal, round, and reactive to light. No scleral icterus.  Neck: Normal range of motion. Neck supple.  Cardiovascular: Normal rate, regular rhythm and normal heart sounds.   Pulmonary/Chest: Breath sounds normal.  Abdominal: Soft. Bowel sounds are  normal. She exhibits no distension. There is no tenderness.  Genitourinary: Rectal exam shows external hemorrhoid. Rectal exam shows no internal hemorrhoid, no fissure, no mass, no tenderness and anal tone normal.  Musculoskeletal: She exhibits no edema.  Lymphadenopathy:    She has no cervical adenopathy.  Neurological: She is alert and oriented to person, place, and time.  NO FOCAL DEFICITS   Psychiatric: She has a normal mood and affect.          Assessment & Plan:

## 2014-02-26 NOTE — Progress Notes (Signed)
All post op instructions reviewed with patient and husband via interpreter.  [name]

## 2014-02-26 NOTE — Interval H&P Note (Signed)
History and Physical Interval Note:  02/26/2014 12:40 PM  [name]  has presented today for surgery, with the diagnosis of RECTAL BLEEDING  The various methods of treatment have been discussed with the patient and family. After consideration of risks, benefits and other options for treatment, the patient has consented to  Procedure(s) with comments: COLONOSCOPY (N/A) - 1:00 as a surgical intervention .  The patient's history has been reviewed, patient examined, no change in status, stable for surgery.  I have reviewed the patient's chart and labs.  Questions were answered to the patient's satisfaction.     Sandi L Fields

## 2014-02-28 ENCOUNTER — Encounter (HOSPITAL_COMMUNITY): Payer: Self-pay | Admitting: Gastroenterology

## 2014-03-21 ENCOUNTER — Telehealth: Payer: Self-pay | Admitting: Gastroenterology

## 2014-03-21 NOTE — Telephone Encounter (Signed)
PLEASE SEND PT LETTER. She had a simple adenoma removed from her colon. FOLLOW A High fiber diet. TCS in 10-15 years IF THE BENEFITS OUTWEIGH THE RISKS.

## 2014-03-21 NOTE — Telephone Encounter (Signed)
Reminder in Epic 

## 2014-03-28 NOTE — Telephone Encounter (Signed)
Letter sent to pt

## 2014-04-03 ENCOUNTER — Telehealth: Payer: Self-pay | Admitting: Gastroenterology

## 2014-04-03 NOTE — Telephone Encounter (Signed)
Family member is aware of OV with SF tomorrow at 47 not 1230. He wants to know if she still needs OV with SF on 6/24 at 1230. I told him that SF would decide that tomorrow.

## 2014-04-03 NOTE — Telephone Encounter (Signed)
PT CAN COME TOMORROW AT 11 AM.

## 2014-04-04 ENCOUNTER — Ambulatory Visit (INDEPENDENT_AMBULATORY_CARE_PROVIDER_SITE_OTHER): Payer: Medicare Other | Admitting: Gastroenterology

## 2014-04-04 ENCOUNTER — Encounter: Payer: Self-pay | Admitting: Gastroenterology

## 2014-04-04 VITALS — BP 122/74 | HR 70 | Temp 97.7°F | Ht 61.0 in | Wt 127.4 lb

## 2014-04-04 DIAGNOSIS — K648 Other hemorrhoids: Secondary | ICD-10-CM

## 2014-04-04 DIAGNOSIS — I2589 Other forms of chronic ischemic heart disease: Secondary | ICD-10-CM

## 2014-04-04 NOTE — Progress Notes (Signed)
cc'd to pcp 

## 2014-04-04 NOTE — Progress Notes (Signed)
  PROCEDURE TECHNIQUE: BENEFITS RISK EXPLAINED TO PT. ANOSCOPY PERFORMED. BULGING INTERNAL HEMORRHOID COLUMN IN THE R POSTERIOR AND ANTERIOR COLUMNS. ONE CRH BAND PLACED IN RIGHT POSTERIOR POSITION. POST-BANDING RECTAL EXAM REVEALED GOOD PLACEMENT. EXAM NON-TENDER.

## 2014-04-04 NOTE — Progress Notes (Signed)
   Subjective:    Patient ID: [name], female    DOB: Oct 26, 1940, 73 y.o.   MRN: [id number] VYAS,DHRUV B., MD  HPI After eats may have BURNING but no bleeding. RARE itching. NO ADDITIONAL questions or concerns.  Past Medical History  Diagnosis Date  . PSVT (paroxysmal supraventricular tachycardia)   . Ischemic cardiomyopathy     Anterior MI 2005 with primary PCI: cath 2013Cardiac catheterization showed a heavily calcified left main with a long 95% stenosis. The LAD had 80% proximal stenosis. The right coronary was a large dominant vessel with proximal 70% stenosis and 50% distal stenosis before the bifurcation. Left ventricular ejection fraction was 35% with akinesis of the anterior apical wall  . Chronic systolic heart failure   . 4315 lead 09/06/2011  . Coronary artery disease   . History of blood transfusion     "after heart surgery" (03/21/2013)  . GERD (gastroesophageal reflux disease)   . Myocardial infarction 02/18/2004    Archie Endo 02/18/2004 (03/20/2013)  . Automatic implantable cardioverter-defibrillator in situ 06/24/2004    Archie Endo 06/24/2004 (03/21/2013)    Past Surgical History  Procedure Laterality Date  . Cardiac defibrillator placement      Medtronic  . Intraoperative transesophageal echocardiogram  11/03/2012    Procedure: INTRAOPERATIVE TRANSESOPHAGEAL ECHOCARDIOGRAM;  Surgeon: Gaye Pollack, MD;  Location: [facility] OR;  Service: Open Heart Surgery;  Laterality: N/A;  . Coronary artery bypass graft  11/03/2012    Procedure: CORONARY ARTERY BYPASS GRAFTING (CABG);  Surgeon: Gaye Pollack, MD;  Location: [facility];  Service: Open Heart Surgery;  Laterality: N/A;  . Cataract extraction w/ intraocular lens  implant, bilateral Bilateral ~2008  . Knee arthroscopy Left 02/19/2010    Archie Endo 02/19/2010 (03/21/2013)  . Coronary angioplasty  02/18/2004    Archie Endo 02/18/2004 (03/21/2013)  . Colonoscopy N/A 02/26/2014    Procedure: COLONOSCOPY;  Surgeon: Danie Binder, MD;  Location: [facility];   Service: Endoscopy;  Laterality: N/A;  1:00    No Known Allergies    Review of Systems     Objective:   Physical Exam  Vitals reviewed. Constitutional: She is oriented to person, place, and time. She appears well-developed and well-nourished. No distress.  HENT:  Head: Normocephalic and atraumatic.  Mouth/Throat: Oropharynx is clear and moist. No oropharyngeal exudate.  Eyes: Pupils are equal, round, and reactive to light. No scleral icterus.  Neck: Normal range of motion. Neck supple.  Cardiovascular: Normal rate, regular rhythm and normal heart sounds.   Pulmonary/Chest: Effort normal and breath sounds normal. No respiratory distress.  Abdominal: Soft. Bowel sounds are normal. She exhibits no distension. There is no tenderness.  Musculoskeletal: She exhibits no edema.  Neurological: She is alert and oriented to person, place, and time.  Psychiatric: She has a normal mood and affect.          Assessment & Plan:

## 2014-04-04 NOTE — Assessment & Plan Note (Signed)
SX IMPROVED BUT NOT RESOLVED.   DRINK WATER TO KEEP YOUR URINE LIGHT YELLOW. EAT FIBER ANUSOL PRN OPV IN 3 WEEKS

## 2014-04-04 NOTE — Patient Instructions (Signed)
DRINK WATER TO KEEP YOUR URINE LIGHT YELLOW.  FOLLOW A HIGH FIBER DIET.  AVOID ITEMS THAT CAUSE BLOATING & GAS.  USE ANUSOL CREAM AS NEEDED FOR RECTAL BURNING OR ITCHING.  FOLLOW UP IN 3 WEEKS.   High-Fiber Diet A high-fiber diet changes your normal diet to include more whole grains, legumes, fruits, and vegetables. Changes in the diet involve replacing refined carbohydrates with unrefined foods. The calorie level of the diet is essentially unchanged. The Dietary Reference Intake (recommended amount) for adult males is 38 grams per day. For adult females, it is 25 grams per day. Pregnant and lactating women should consume 28 grams of fiber per day. Fiber is the intact part of a plant that is not broken down during digestion. Functional fiber is fiber that has been isolated from the plant to provide a beneficial effect in the body. PURPOSE  Increase stool bulk.   Ease and regulate bowel movements.   Lower cholesterol.  INDICATIONS THAT YOU NEED MORE FIBER  Constipation and hemorrhoids.   Uncomplicated diverticulosis (intestine condition) and irritable bowel syndrome.   Weight management.   As a protective measure against hardening of the arteries (atherosclerosis), diabetes, and cancer.   GUIDELINES FOR INCREASING FIBER IN THE DIET  Start adding fiber to the diet slowly. A gradual increase of about 5 more grams (2 slices of whole-wheat bread, 2 servings of most fruits or vegetables, or 1 bowl of high-fiber cereal) per day is best. Too rapid an increase in fiber may result in constipation, flatulence, and bloating.   Drink enough water and fluids to keep your urine clear or pale yellow. Water, juice, or caffeine-free drinks are recommended. Not drinking enough fluid may cause constipation.   Eat a variety of high-fiber foods rather than one type of fiber.   Try to increase your intake of fiber through using high-fiber foods rather than fiber pills or supplements that contain small  amounts of fiber.   The goal is to change the types of food eaten. Do not supplement your present diet with high-fiber foods, but replace foods in your present diet.  INCLUDE A VARIETY OF FIBER SOURCES  Replace refined and processed grains with whole grains, canned fruits with fresh fruits, and incorporate other fiber sources. White rice, white breads, and most bakery goods contain little or no fiber.   Brown whole-grain rice, buckwheat oats, and many fruits and vegetables are all good sources of fiber. These include: broccoli, Brussels sprouts, cabbage, cauliflower, beets, sweet potatoes, white potatoes (skin on), carrots, tomatoes, eggplant, squash, berries, fresh fruits, and dried fruits.   Cereals appear to be the richest source of fiber. Cereal fiber is found in whole grains and bran. Bran is the fiber-rich outer coat of cereal grain, which is largely removed in refining. In whole-grain cereals, the bran remains. In breakfast cereals, the largest amount of fiber is found in those with "bran" in their names. The fiber content is sometimes indicated on the label.   You may need to include additional fruits and vegetables each day.   In baking, for 1 cup white flour, you may use the following substitutions:   1 cup whole-wheat flour minus 2 tablespoons.   1/2 cup white flour plus 1/2 cup whole-wheat flour.

## 2014-04-08 ENCOUNTER — Encounter: Payer: Self-pay | Admitting: Internal Medicine

## 2014-04-08 ENCOUNTER — Ambulatory Visit (INDEPENDENT_AMBULATORY_CARE_PROVIDER_SITE_OTHER): Payer: Medicare Other | Admitting: *Deleted

## 2014-04-08 DIAGNOSIS — I2589 Other forms of chronic ischemic heart disease: Secondary | ICD-10-CM

## 2014-04-08 DIAGNOSIS — I498 Other specified cardiac arrhythmias: Secondary | ICD-10-CM

## 2014-04-08 DIAGNOSIS — R001 Bradycardia, unspecified: Secondary | ICD-10-CM

## 2014-04-08 DIAGNOSIS — I5022 Chronic systolic (congestive) heart failure: Secondary | ICD-10-CM

## 2014-04-08 DIAGNOSIS — I471 Supraventricular tachycardia: Secondary | ICD-10-CM

## 2014-04-08 LAB — MDC_IDC_ENUM_SESS_TYPE_INCLINIC
Battery Voltage: 3.02 V
Brady Statistic AP VP Percent: 0.03 %
Brady Statistic AS VP Percent: 0.01 %
Brady Statistic RA Percent Paced: 20.04 %
HIGH POWER IMPEDANCE MEASURED VALUE: 190 Ohm
HighPow Impedance: 48 Ohm
Lead Channel Impedance Value: 418 Ohm
Lead Channel Impedance Value: 418 Ohm
Lead Channel Pacing Threshold Pulse Width: 0.4 ms
Lead Channel Pacing Threshold Pulse Width: 0.4 ms
Lead Channel Sensing Intrinsic Amplitude: 0.5 mV
Lead Channel Sensing Intrinsic Amplitude: 1 mV
MDC IDC MSMT BATTERY REMAINING LONGEVITY: 120 mo
MDC IDC MSMT LEADCHNL RA PACING THRESHOLD AMPLITUDE: 0.875 V
MDC IDC MSMT LEADCHNL RV PACING THRESHOLD AMPLITUDE: 0.5 V
MDC IDC MSMT LEADCHNL RV SENSING INTR AMPL: 13 mV
MDC IDC MSMT LEADCHNL RV SENSING INTR AMPL: 14.5 mV
MDC IDC SESS DTM: 20150622115943
MDC IDC SET LEADCHNL RA PACING AMPLITUDE: 2.25 V
MDC IDC SET LEADCHNL RV PACING AMPLITUDE: 2.5 V
MDC IDC SET LEADCHNL RV PACING PULSEWIDTH: 0.4 ms
MDC IDC SET LEADCHNL RV SENSING SENSITIVITY: 0.3 mV
MDC IDC SET ZONE DETECTION INTERVAL: 350 ms
MDC IDC SET ZONE DETECTION INTERVAL: 450 ms
MDC IDC STAT BRADY AP VS PERCENT: 20.01 %
MDC IDC STAT BRADY AS VS PERCENT: 79.95 %
MDC IDC STAT BRADY RV PERCENT PACED: 0.04 %
Zone Setting Detection Interval: 240 ms
Zone Setting Detection Interval: 300 ms
Zone Setting Detection Interval: 370 ms

## 2014-04-08 NOTE — Progress Notes (Signed)
Pt is aware of OV for 7/13 at 1130 with SF ONLY

## 2014-04-08 NOTE — Progress Notes (Signed)
ICD check in clinic. Normal device function. Thresholds and sensing consistent with previous device measurements. Impedance trends stable over time. SIC=0. 1 NSVT episode x 13 bts @ 79/194. No mode switches. Histogram distribution appropriate for patient and level of activity. No changes made this session. Device programmed at appropriate safety margins. Device programmed to optimize intrinsic conduction. Estimated longevity 10 years. Plan to follow up with SK on 9-9. Patient placed on Statin Holiday due to cramps x 2 weeks w/o edema. Patient also advised to drink plenty of fluids. Advised pt to call back on 7-6 with results.

## 2014-04-10 ENCOUNTER — Ambulatory Visit: Payer: Medicare Other | Admitting: Gastroenterology

## 2014-04-16 ENCOUNTER — Other Ambulatory Visit: Payer: Self-pay | Admitting: Cardiovascular Disease

## 2014-05-01 ENCOUNTER — Encounter: Payer: Medicare Other | Admitting: Gastroenterology

## 2014-05-14 ENCOUNTER — Other Ambulatory Visit: Payer: Self-pay | Admitting: Cardiovascular Disease

## 2014-05-16 ENCOUNTER — Telehealth: Payer: Self-pay | Admitting: *Deleted

## 2014-05-16 NOTE — Telephone Encounter (Signed)
[place] stated that the omeprazole prescribed by Dr [name] interacts with the plavix. Ok to continue these together? Please advise. Thanks, MI

## 2014-05-17 IMAGING — CR DG CHEST 1V PORT
1 series · 1 of 1 positions shown · non-contrast
Comparison: Chest radiograph 11/05/2012

CLINICAL DATA: Postop chest tube

PORTABLE CHEST - 1 VIEW

[AP]
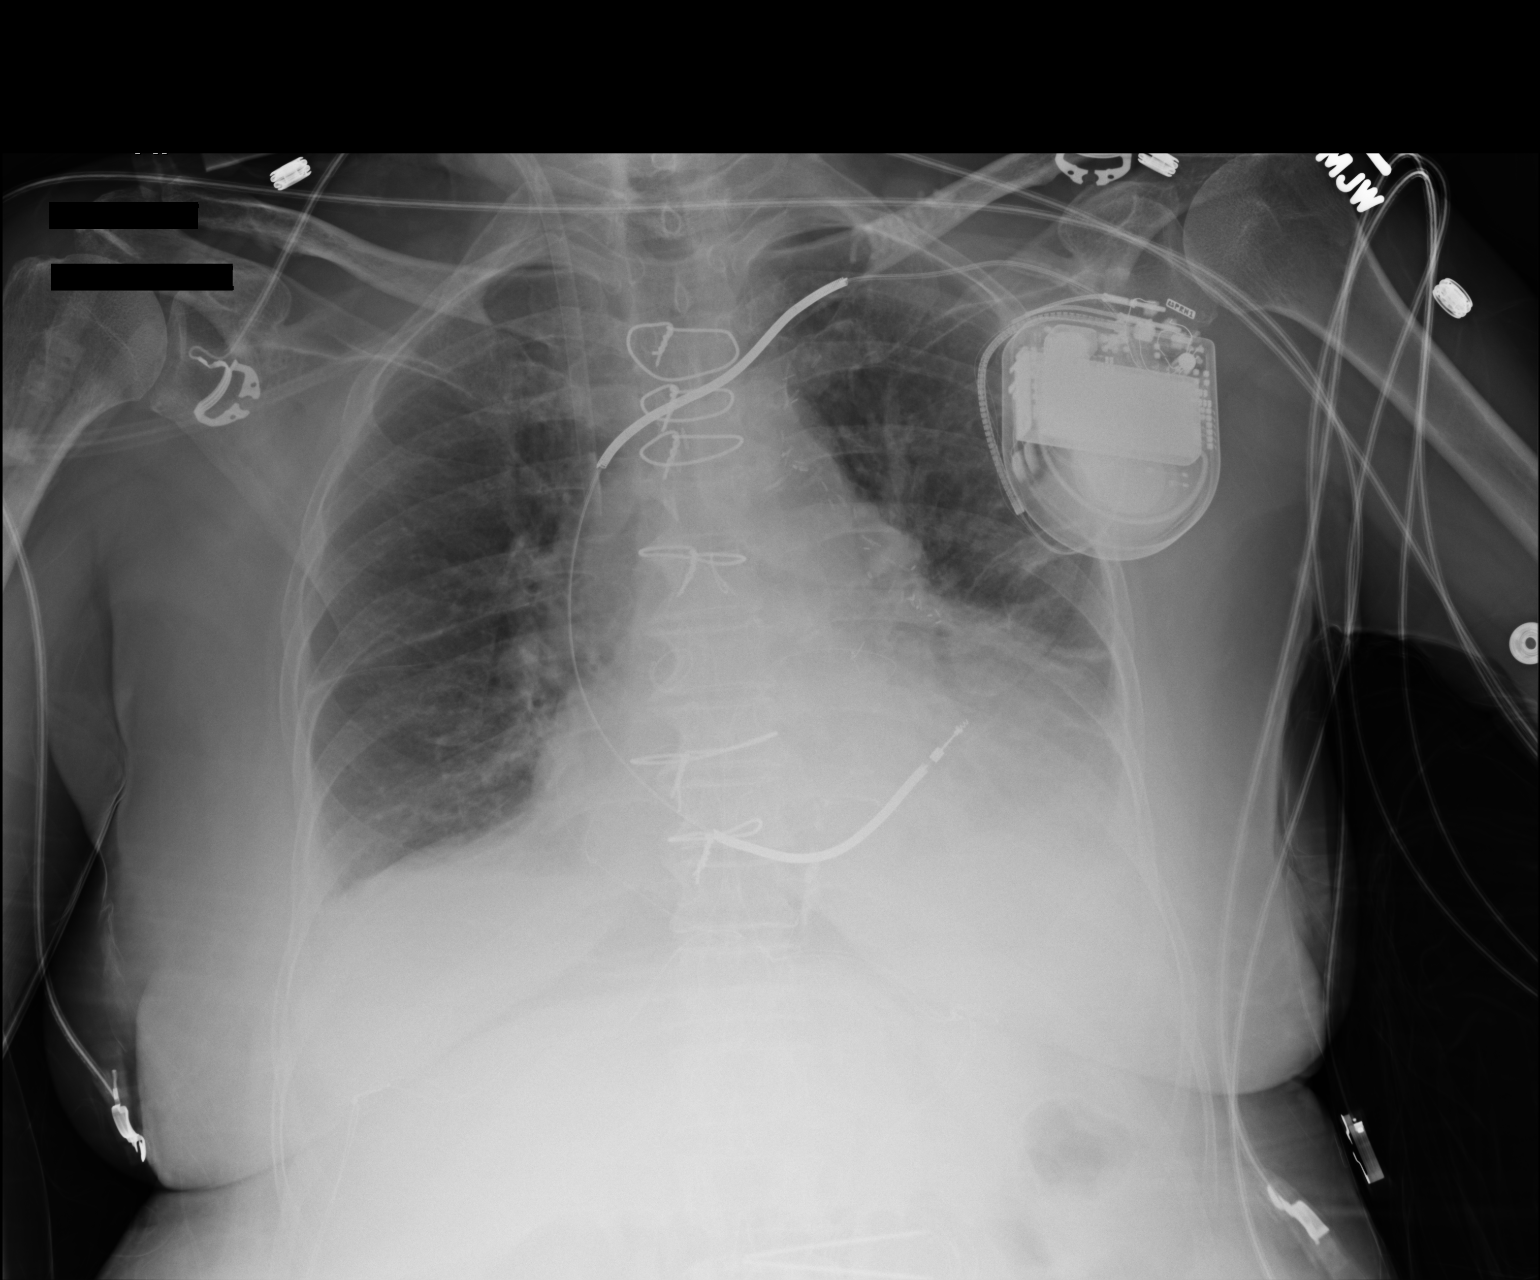

[1 of 1 positions shown; findings below may reference images not displayed]

FINDINGS: Sternotomy wires and left pacemaker overlies stable
enlarged cardiac silhouette.  Right IJ sheath remains.  There is
left basilar atelectasis and small effusion unchanged.  No
pneumothorax.
IMPRESSION: No interval change.  Left basilar atelectasis and small effusion.

## 2014-05-17 NOTE — Telephone Encounter (Signed)
Advised pharmacy that we would be discontinuing Plavix.

## 2014-05-17 NOTE — Telephone Encounter (Signed)
Called patient, who speaks little [place]. She asked for me to call back tomorrow morning to speak with her daughter who can translate.   (Per Dr. [name], patient ok to d/c Plavix)

## 2014-05-20 NOTE — Telephone Encounter (Signed)
Called pt's family member Sona (per pt request, as she understands small amt of [place]. Sona translates for her) and informed her that pt should stop Plavix. Also let them know that we notified [place] also.  Sona verbalized understanding and will pass important information along to patient today.

## 2014-05-23 ENCOUNTER — Encounter: Payer: Self-pay | Admitting: Gastroenterology

## 2014-05-23 ENCOUNTER — Ambulatory Visit (INDEPENDENT_AMBULATORY_CARE_PROVIDER_SITE_OTHER): Payer: Medicare Other | Admitting: Gastroenterology

## 2014-05-23 ENCOUNTER — Encounter (INDEPENDENT_AMBULATORY_CARE_PROVIDER_SITE_OTHER): Payer: Self-pay

## 2014-05-23 VITALS — BP 108/69 | HR 72 | Temp 97.6°F | Resp 16 | Ht 61.0 in | Wt 126.0 lb

## 2014-05-23 DIAGNOSIS — K648 Other hemorrhoids: Secondary | ICD-10-CM

## 2014-05-23 MED ORDER — HYDROCORTISONE 2.5 % RE CREA: 1.0000 "application " | TOPICAL_CREAM | Freq: Two times a day (BID) | RECTAL | Status: DC

## 2014-05-23 NOTE — Addendum Note (Signed)
Addended by: Danie Binder on: 05/23/2014 11:41 AM   Modules accepted: Orders

## 2014-05-23 NOTE — Assessment & Plan Note (Signed)
FOLLOW UP IN 4 MOS. USE ANUSOL CREAM AS NEEDED.

## 2014-05-23 NOTE — Progress Notes (Signed)
PROCEDURE TECHNIQUE: BENEFITS RISK EXPLAINED TO PT. RECTAL EXAM SHOWS SKIN TAG/NO MASSES. ANOSCOPY PERFORMED. BULGE IN R POSTERIOR COLUMN.  ONE CRH BAND PLACED IN RIGHT POSTERIOR POSITION. POST-BANDING RECTAL EXAM REVEALED GOOD PLACEMENT. EXAM NON-TENDER.

## 2014-05-23 NOTE — Progress Notes (Signed)
SYMPTOMS: no RECTAL BLEEDING, PRESSURE, or PAIN, rare ITCHING, no BURNING.  CONSTIPATION: NO DIARRHEA: no  STRAINS WITH BMs: no  TIME SPENT ON TOILET: 15 MINS TISSUE POKES OUT OF RECTUM: no FIBER SUPPLEMENTS: NO  GLASSES OF WATER/DAY: 6-8   ADDITIONAL QUESTIONS:  LATEX ALLERGY: NO PREGNANT: NO ERECTILE DYSFUNCTION MEDS OR NITRATES: NO ANTICOAGULATION/ANTIPLATELET MEDS: NO DIAGNOSED WITH CROHN'S DISEASE, PROCTITIS, PORTAL HTN, OR ANAL/RECTAL CA: NO TAKING IMMUNOSUPPRESSANTS/XRT: NO   PLAN: 1. ANOSCOPY/?CRH BANDING TODAY

## 2014-05-23 NOTE — Patient Instructions (Signed)
USE ANUSOL CREAM AS NEEDED FOR ITCHING.  DRINK WATER TO KEEP YOUR URINE LIGHT YELLOW.  FOLLOW A HIGH FIBER DIET.   FOLLOW UP IN 4 MOS.

## 2014-06-15 IMAGING — CR DG CHEST 2V
2 series · 2 of 2 positions shown · non-contrast
Comparison: 11/06/2012

CLINICAL DATA: Coronary disease, bypass 4 weeks ago, follow-up

CHEST - 2 VIEW

[w chest pa]
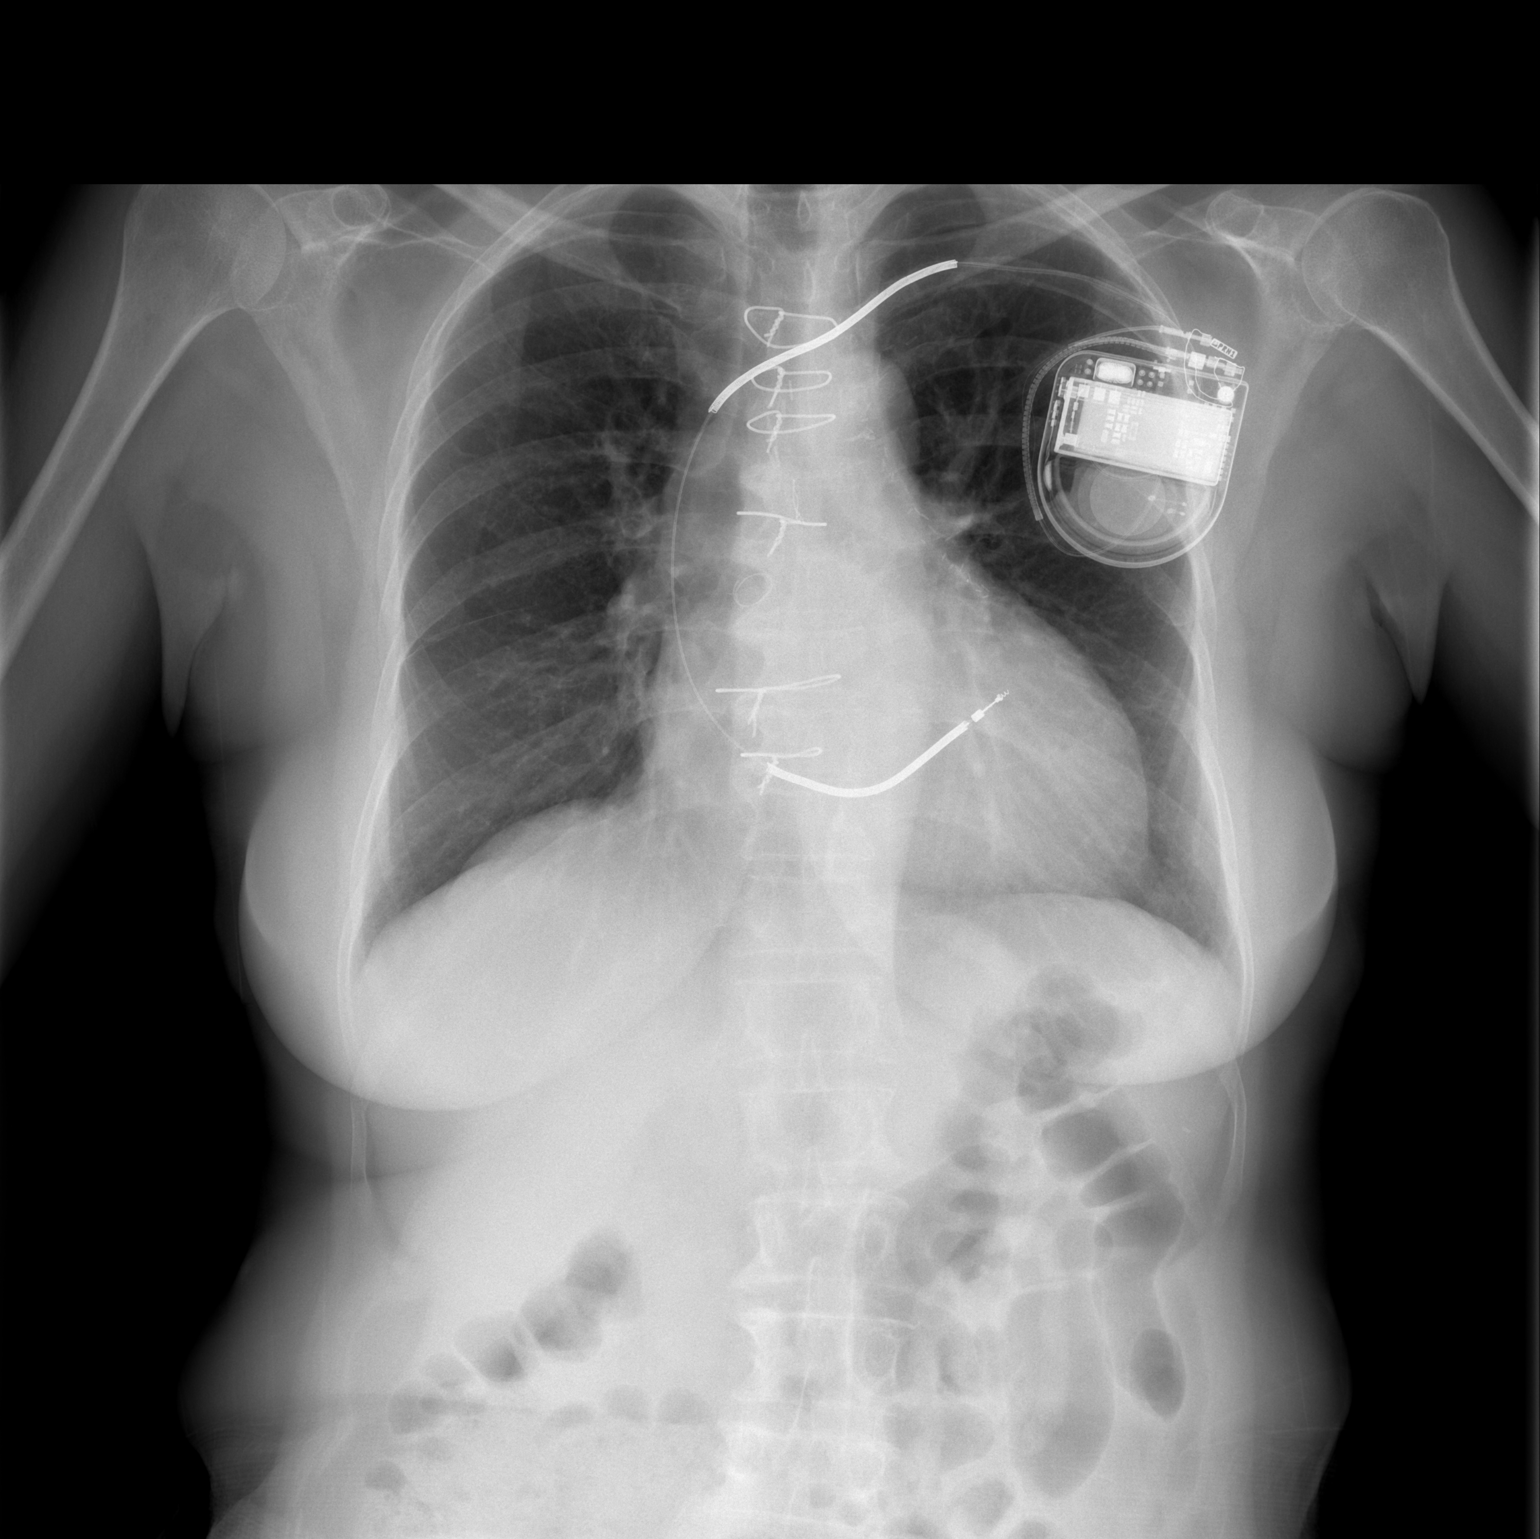

[w chest lat]
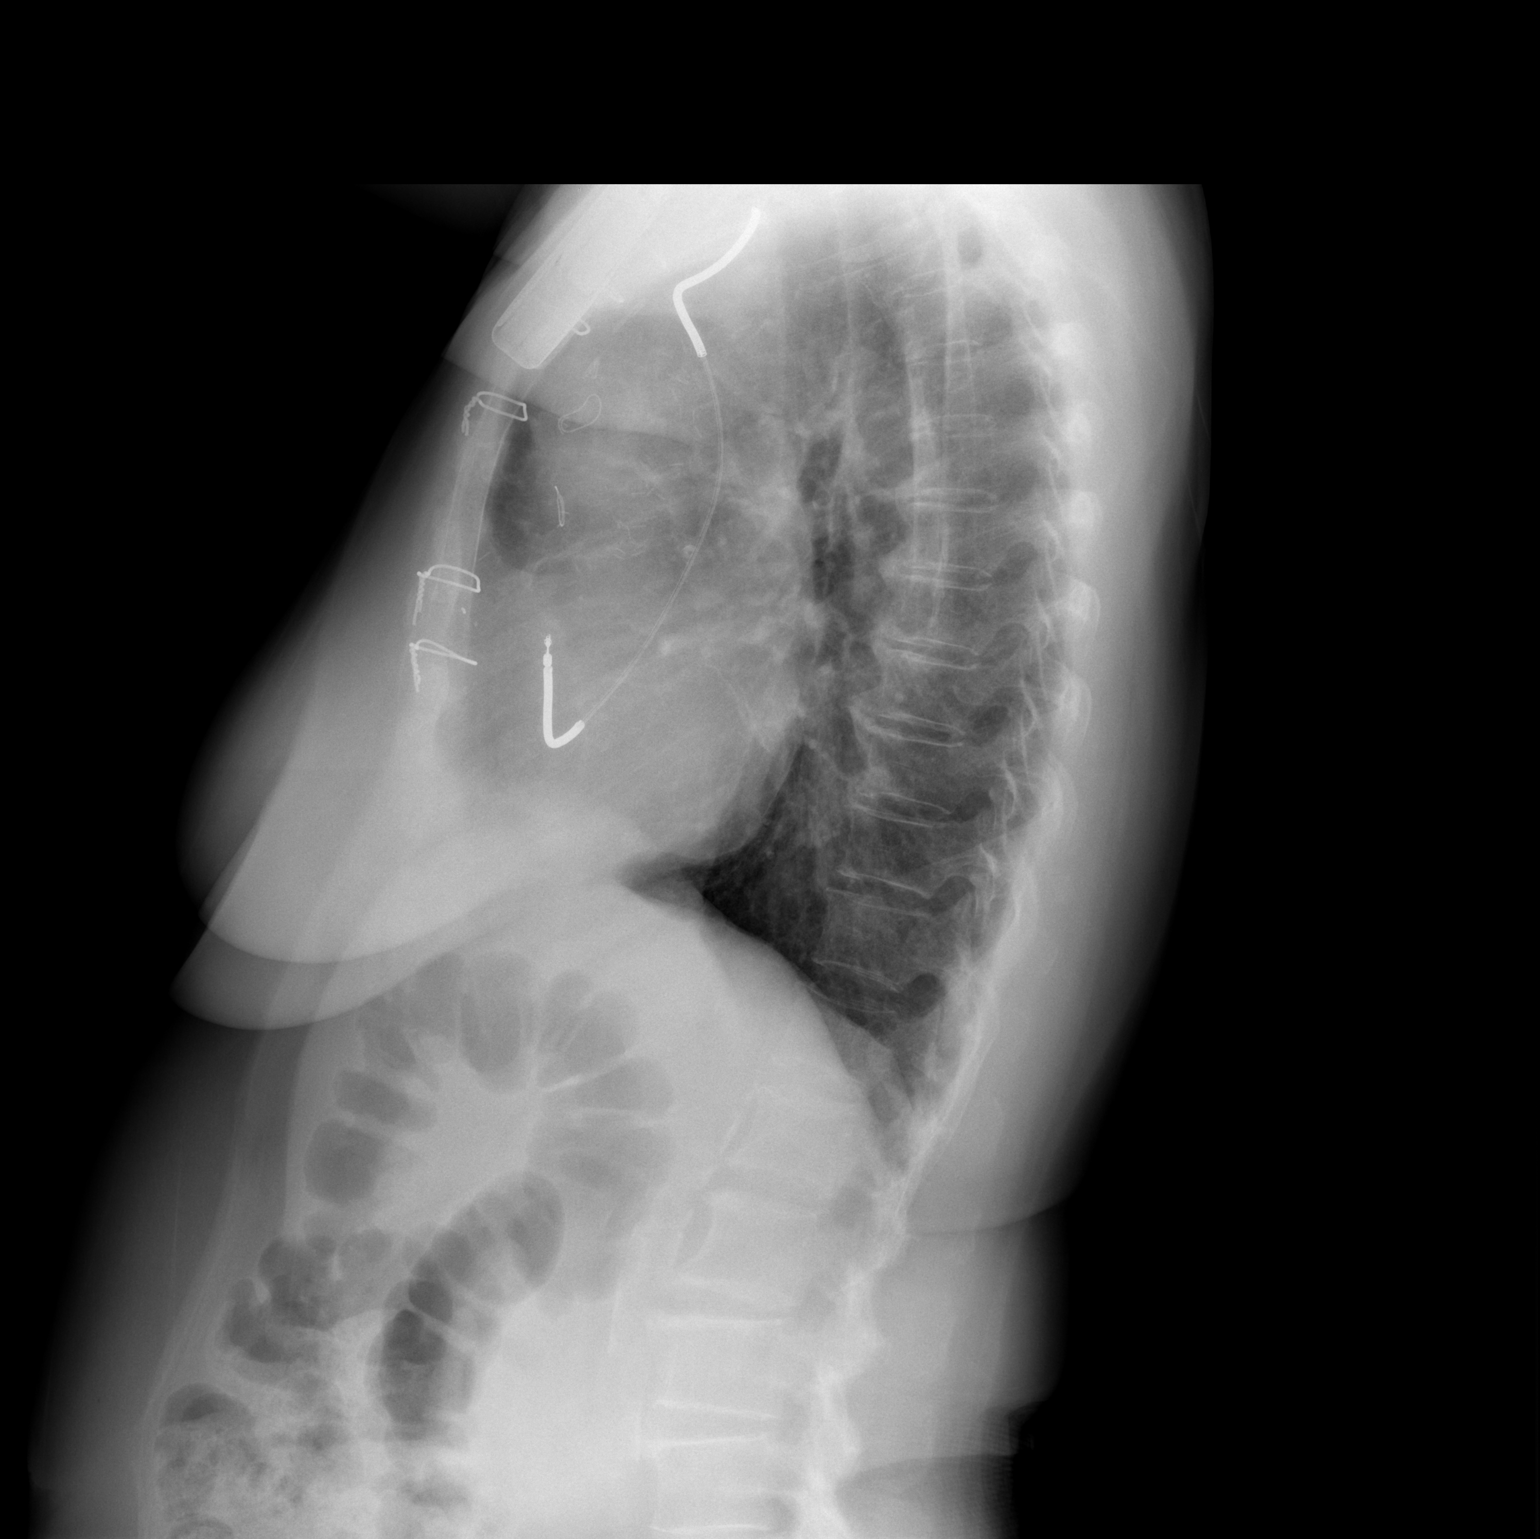

[2 of 2 positions shown; findings below may reference images not displayed]

FINDINGS: Stable coronary bypass changes and left subclavian AICD
insertion.  Mild cardiac enlargement but normal vascularity.
Negative for CHF or pneumonia.  No collapse, consolidation, edema,
effusion or pneumothorax.  Trachea midline.  Nonobstructive bowel
gas pattern.  Degenerative changes of the spine.
IMPRESSION: Stable postoperative changes.  No acute chest process.

## 2014-06-21 ENCOUNTER — Other Ambulatory Visit: Payer: Self-pay

## 2014-06-21 ENCOUNTER — Other Ambulatory Visit: Payer: Self-pay | Admitting: Internal Medicine

## 2014-06-21 MED ORDER — CARVEDILOL 6.25 MG PO TABS: ORAL_TABLET | ORAL | Status: DC

## 2014-06-26 ENCOUNTER — Ambulatory Visit (INDEPENDENT_AMBULATORY_CARE_PROVIDER_SITE_OTHER): Payer: Medicare Other | Admitting: Internal Medicine

## 2014-06-26 ENCOUNTER — Encounter: Payer: Self-pay | Admitting: Internal Medicine

## 2014-06-26 VITALS — BP 114/58 | HR 60 | Ht 60.0 in | Wt 123.2 lb

## 2014-06-26 DIAGNOSIS — I2589 Other forms of chronic ischemic heart disease: Secondary | ICD-10-CM

## 2014-06-26 DIAGNOSIS — I5022 Chronic systolic (congestive) heart failure: Secondary | ICD-10-CM

## 2014-06-26 DIAGNOSIS — I255 Ischemic cardiomyopathy: Secondary | ICD-10-CM

## 2014-06-26 DIAGNOSIS — Z95810 Presence of automatic (implantable) cardiac defibrillator: Secondary | ICD-10-CM

## 2014-06-26 DIAGNOSIS — I471 Supraventricular tachycardia: Secondary | ICD-10-CM

## 2014-06-26 LAB — MDC_IDC_ENUM_SESS_TYPE_INCLINIC
Brady Statistic AP VS Percent: 21.04 %
Brady Statistic AS VP Percent: 0.01 %
Brady Statistic RA Percent Paced: 21.07 %
HighPow Impedance: 190 Ohm
HighPow Impedance: 55 Ohm
Lead Channel Impedance Value: 456 Ohm
Lead Channel Pacing Threshold Amplitude: 0.75 V
Lead Channel Pacing Threshold Amplitude: 0.75 V
Lead Channel Sensing Intrinsic Amplitude: 0.75 mV
Lead Channel Sensing Intrinsic Amplitude: 18.625 mV
Lead Channel Setting Pacing Amplitude: 2 V
Lead Channel Setting Pacing Amplitude: 2.5 V
Lead Channel Setting Pacing Pulse Width: 0.4 ms
MDC IDC MSMT BATTERY REMAINING LONGEVITY: 117 mo
MDC IDC MSMT BATTERY VOLTAGE: 2.99 V
MDC IDC MSMT LEADCHNL RA IMPEDANCE VALUE: 456 Ohm
MDC IDC MSMT LEADCHNL RA PACING THRESHOLD PULSEWIDTH: 0.4 ms
MDC IDC MSMT LEADCHNL RV PACING THRESHOLD PULSEWIDTH: 0.4 ms
MDC IDC SESS DTM: 20150909124111
MDC IDC SET LEADCHNL RV SENSING SENSITIVITY: 0.3 mV
MDC IDC SET ZONE DETECTION INTERVAL: 240 ms
MDC IDC SET ZONE DETECTION INTERVAL: 450 ms
MDC IDC STAT BRADY AP VP PERCENT: 0.03 %
MDC IDC STAT BRADY AS VS PERCENT: 78.92 %
MDC IDC STAT BRADY RV PERCENT PACED: 0.04 %
Zone Setting Detection Interval: 300 ms
Zone Setting Detection Interval: 350 ms
Zone Setting Detection Interval: 370 ms

## 2014-06-26 MED ORDER — LISINOPRIL 2.5 MG PO TABS: 2.5000 mg | ORAL_TABLET | Freq: Every day | ORAL | Status: DC

## 2014-06-26 MED ORDER — SIMVASTATIN 20 MG PO TABS: ORAL_TABLET | ORAL | Status: DC

## 2014-06-26 MED ORDER — NITROGLYCERIN 0.4 MG SL SUBL: 0.4000 mg | SUBLINGUAL_TABLET | SUBLINGUAL | Status: DC | PRN

## 2014-06-26 MED ORDER — CARVEDILOL 6.25 MG PO TABS: ORAL_TABLET | ORAL | Status: DC

## 2014-06-26 NOTE — Patient Instructions (Signed)
Your physician recommends that you schedule a follow-up appointment in: last week of November with Dr. [name].   Your physician has recommended you make the following change in your medication:  1) CHANGE the way you take Carvedilol -- take 1/2 tablet in the morning, take 1 tablet in the evening 2) TAKE Lisinopril 2.5 mg daily at bedtime 3) TAKE Simvastatin 20 mg twice weekly 4) TAKE Nitroglycerin as needed. Take it as directed.  Nitroglycerin sublingual tablets What is this medicine? NITROGLYCERIN (nye troe GLI ser in) is a type of vasodilator. It relaxes blood vessels, increasing the blood and oxygen supply to your heart. This medicine is used to relieve chest pain caused by angina. It is also used to prevent chest pain before activities like climbing stairs, going outdoors in cold weather, or sexual activity. This medicine may be used for other purposes; ask your health care provider or pharmacist if you have questions. COMMON BRAND NAME(S): Nitroquick, Nitrostat, Nitrotab What should I tell my health care provider before I take this medicine? They need to know if you have any of these conditions: -anemia -head injury, recent stroke, or bleeding in the brain -liver disease -previous heart attack -an unusual or allergic reaction to nitroglycerin, other medicines, foods, dyes, or preservatives -pregnant or trying to get pregnant -breast-feeding How should I use this medicine? Take this medicine by mouth as needed. At the first sign of an angina attack (chest pain or tightness) place one tablet under your tongue. You can also take this medicine 5 to 10 minutes before an event likely to produce chest pain. Follow the directions on the prescription label. Let the tablet dissolve under the tongue. Do not swallow whole. Replace the dose if you accidentally swallow it. It will help if your mouth is not dry. Saliva around the tablet will help it to dissolve more quickly. Do not eat or drink, smoke or  chew tobacco while a tablet is dissolving. If you are not better within 5 minutes after taking ONE dose of nitroglycerin, call 9-1-1 immediately to seek emergency medical care. Do not take more than 3 nitroglycerin tablets over 15 minutes. If you take this medicine often to relieve symptoms of angina, your doctor or health care professional may provide you with different instructions to manage your symptoms. If symptoms do not go away after following these instructions, it is important to call 9-1-1 immediately. Do not take more than 3 nitroglycerin tablets over 15 minutes. Talk to your pediatrician regarding the use of this medicine in children. Special care may be needed. Overdosage: If you think you have taken too much of this medicine contact a poison control center or emergency room at once. NOTE: This medicine is only for you. Do not share this medicine with others. What if I miss a dose? This does not apply. This medicine is only used as needed. What may interact with this medicine? Do not take this medicine with any of the following medications: -certain migraine medicines like ergotamine and dihydroergotamine (DHE) -medicines used to treat erectile dysfunction like sildenafil, tadalafil, and vardenafil -riociguat This medicine may also interact with the following medications: -alteplase -aspirin -heparin -medicines for high blood pressure -medicines for mental depression -other medicines used to treat angina -phenothiazines like chlorpromazine, mesoridazine, prochlorperazine, thioridazine This list may not describe all possible interactions. Give your health care provider a list of all the medicines, herbs, non-prescription drugs, or dietary supplements you use. Also tell them if you smoke, drink alcohol, or use illegal  drugs. Some items may interact with your medicine. What should I watch for while using this medicine? Tell your doctor or health care professional if you feel your  medicine is no longer working. Keep this medicine with you at all times. Sit or lie down when you take your medicine to prevent falling if you feel dizzy or faint after using it. Try to remain calm. This will help you to feel better faster. If you feel dizzy, take several deep breaths and lie down with your feet propped up, or bend forward with your head resting between your knees. You may get drowsy or dizzy. Do not drive, use machinery, or do anything that needs mental alertness until you know how this drug affects you. Do not stand or sit up quickly, especially if you are an older patient. This reduces the risk of dizzy or fainting spells. Alcohol can make you more drowsy and dizzy. Avoid alcoholic drinks. Do not treat yourself for coughs, colds, or pain while you are taking this medicine without asking your doctor or health care professional for advice. Some ingredients may increase your blood pressure. What side effects may I notice from receiving this medicine? Side effects that you should report to your doctor or health care professional as soon as possible: -blurred vision -dry mouth -skin rash -sweating -the feeling of extreme pressure in the head -unusually weak or tired Side effects that usually do not require medical attention (report to your doctor or health care professional if they continue or are bothersome): -flushing of the face or neck -headache -irregular heartbeat, palpitations -nausea, vomiting This list may not describe all possible side effects. Call your doctor for medical advice about side effects. You may report side effects to FDA at 1-800-FDA-1088. Where should I keep my medicine? Keep out of the reach of children. Store at room temperature between 20 and 25 degrees C (68 and 77 degrees F). Store in [employer]. Protect from light and moisture. Keep tightly closed. Throw away any unused medicine after the expiration date. NOTE: This sheet is a summary. It may  not cover all possible information. If you have questions about this medicine, talk to your doctor, pharmacist, or health care provider.  2015, Elsevier/Gold Standard. (2013-08-02 17:57:36)

## 2014-06-26 NOTE — Progress Notes (Signed)
Patient Care Team: [name], MD as PCP - General (Internal Medicine) Danie Binder, MD as Attending Physician (Gastroenterology)   HPI  [name] is a 73 y.o. female Seen in followup for congestive heart failure in the setting of ischemic cardiomyopathy with previously implanted ICD. She was developing chest pain in the fall. Myoview scan was abnormal. She underwent catheterization. This demonstrated 70% proximal right 80% proximal LAD and a 95% left main. EF was 35% and she underwent bypass surgery  Of June 2014 her 6949 lead fractured requiring replacement. At that time she received a new atrial lead 2/2 bradycardia and a new generator.   The patient denies chest pain, shortness of breath, nocturnal dyspnea, orthopnea or peripheral edema. T  With exertion she gets fatigued and lightheaded but has no shortness of breath or chest pain; she also had shower lightheadedness and orthostatic lightheadedness. Her diet is very unusual and she fasts frequently because of her many  gods      Past Medical History  Diagnosis Date  . PSVT (paroxysmal supraventricular tachycardia)   . Ischemic cardiomyopathy     Anterior MI 2005 with primary PCI: cath 2013Cardiac catheterization showed a heavily calcified left main with a long 95% stenosis. The LAD had 80% proximal stenosis. The right coronary was a large dominant vessel with proximal 70% stenosis and 50% distal stenosis before the bifurcation. Left ventricular ejection fraction was 35% with akinesis of the anterior apical wall  . Chronic systolic heart failure   . 1937 lead 09/06/2011  . Coronary artery disease   . History of blood transfusion     "after heart surgery" (03/21/2013)  . GERD (gastroesophageal reflux disease)   . Myocardial infarction 02/18/2004    Archie Endo 02/18/2004 (03/20/2013)  . Automatic implantable cardioverter-defibrillator in situ 06/24/2004    Archie Endo 06/24/2004 (03/21/2013)    Past Surgical History  Procedure  Laterality Date  . Cardiac defibrillator placement      [employer]tronic  . Intraoperative transesophageal echocardiogram  11/03/2012    Procedure: INTRAOPERATIVE TRANSESOPHAGEAL ECHOCARDIOGRAM;  Surgeon: Gaye Pollack, MD;  Location: [facility] OR;  Service: Open Heart Surgery;  Laterality: N/A;  . Coronary artery bypass graft  11/03/2012    Procedure: CORONARY ARTERY BYPASS GRAFTING (CABG);  Surgeon: Gaye Pollack, MD;  Location: [place];  Service: Open Heart Surgery;  Laterality: N/A;  . Cataract extraction w/ intraocular lens  implant, bilateral Bilateral ~2008  . Knee arthroscopy Left 02/19/2010    Archie Endo 02/19/2010 (03/21/2013)  . Coronary angioplasty  02/18/2004    Archie Endo 02/18/2004 (03/21/2013)  . Colonoscopy N/A 02/26/2014    Procedure: COLONOSCOPY;  Surgeon: Danie Binder, MD;  [facility];  Service: Endoscopy;  Laterality: N/A;  1:00    Current Outpatient Prescriptions  Medication Sig Dispense Refill  . aspirin EC 81 MG tablet Take 1 tablet (81 mg total) by mouth daily.      . benzonatate (TESSALON) 100 MG capsule Take 1 capsule (100 mg total) by mouth 3 (three) times daily as needed for cough.  20 capsule  0  . carvedilol (COREG) 6.25 MG tablet TAKE ONE TABLET BY MOUTH TWICE A DAY WITH A MEAL  60 tablet  0  . diclofenac sodium (VOLTAREN) 1 % GEL Apply 1 application topically. As needed      . gabapentin (NEURONTIN) 100 MG capsule Take 100 mg by mouth at bedtime.      . hydrocortisone (ANUSOL-HC) 2.5 % rectal cream  Place 1 application rectally 2 (two) times daily.  30 g  1  . hydrocortisone (ANUSOL-HC) 25 MG suppository Place 25 mg rectally 2 (two) times daily.      [name] Kitchen lisinopril (PRINIVIL,ZESTRIL) 2.5 MG tablet TAKE ONE (1) TABLET BY MOUTH EVERY DAY  90 tablet  0  . metFORMIN (GLUCOPHAGE) 500 MG tablet Take 500 mg by mouth daily.      [name] Kitchen omeprazole (PRILOSEC) 20 MG capsule Take 20 mg by mouth as needed.       . simvastatin (ZOCOR) 20 MG tablet Take 20 mg by mouth at bedtime.       No current  facility-administered medications for this visit.    No Known Allergies  Review of Systems negative except from HPI and PMH  Physical Exam BP 114/58  Pulse 60  Ht 5' (1.524 m)  Wt 123 lb 3.2 oz (55.883 kg)  BMI 24.06 kg/m2 Well developed and well nourished in no acute distress HENT normal E scleral and icterus clear Neck Supple JVP flat; carotids brisk and full Clear to ausculation *Regular rate and rhythm, no murmurs gallops or rub Soft with active bowel sounds No clubbing cyanosis  Edema Alert and oriented, grossly normal motor and sensory function Skin Warm and Dry    Assessment and  Plan  Ischemic cardiomyopathy  CRT-D  Systolic heart failure   Orthostatic lightheadedness  Myalgias  From cardiac point of view, she is doing well  Will continue curent cardiac meds  Her myalgias resolved with the discontinuation of her statins; we'll try and resume it twice a week.  We will decrease her carvedilol 6.25 twice a day--3.125/6.25 and have her take her ACE inhibitor at night to try to mitigate some of her orthostasis. She is encouraged to walk more.  Without symptoms of ischemia

## 2014-07-17 ENCOUNTER — Other Ambulatory Visit: Payer: Self-pay | Admitting: Internal Medicine

## 2014-08-26 ENCOUNTER — Encounter: Payer: Self-pay | Admitting: Gastroenterology

## 2014-09-01 ENCOUNTER — Other Ambulatory Visit: Payer: Self-pay | Admitting: Cardiovascular Disease

## 2014-09-16 ENCOUNTER — Other Ambulatory Visit: Payer: Self-pay | Admitting: Internal Medicine

## 2014-09-17 ENCOUNTER — Other Ambulatory Visit: Payer: Self-pay | Admitting: Internal Medicine

## 2014-09-18 ENCOUNTER — Ambulatory Visit (INDEPENDENT_AMBULATORY_CARE_PROVIDER_SITE_OTHER): Payer: Medicare Other | Admitting: Internal Medicine

## 2014-09-18 ENCOUNTER — Encounter: Payer: Self-pay | Admitting: Internal Medicine

## 2014-09-18 ENCOUNTER — Other Ambulatory Visit: Payer: Self-pay | Admitting: Internal Medicine

## 2014-09-18 VITALS — BP 124/58 | HR 62 | Ht 60.0 in | Wt 125.0 lb

## 2014-09-18 DIAGNOSIS — I5022 Chronic systolic (congestive) heart failure: Secondary | ICD-10-CM | POA: Diagnosis not present

## 2014-09-18 DIAGNOSIS — I255 Ischemic cardiomyopathy: Secondary | ICD-10-CM

## 2014-09-18 DIAGNOSIS — I2589 Other forms of chronic ischemic heart disease: Secondary | ICD-10-CM

## 2014-09-18 DIAGNOSIS — Z4502 Encounter for adjustment and management of automatic implantable cardiac defibrillator: Secondary | ICD-10-CM | POA: Diagnosis not present

## 2014-09-18 LAB — MDC_IDC_ENUM_SESS_TYPE_INCLINIC
Battery Remaining Longevity: 116 mo
Brady Statistic AP VP Percent: 0.04 %
Brady Statistic AP VS Percent: 27.75 %
Brady Statistic AS VP Percent: 0.01 %
Brady Statistic AS VS Percent: 72.2 %
HighPow Impedance: 190 Ohm
HighPow Impedance: 55 Ohm
Lead Channel Pacing Threshold Amplitude: 0.625 V
Lead Channel Pacing Threshold Pulse Width: 0.4 ms
Lead Channel Sensing Intrinsic Amplitude: 0.75 mV
Lead Channel Sensing Intrinsic Amplitude: 0.75 mV
Lead Channel Sensing Intrinsic Amplitude: 13.25 mV
Lead Channel Sensing Intrinsic Amplitude: 13.25 mV
Lead Channel Setting Pacing Amplitude: 2.5 V
Lead Channel Setting Pacing Pulse Width: 0.4 ms
Lead Channel Setting Sensing Sensitivity: 0.3 mV
MDC IDC MSMT BATTERY VOLTAGE: 3.03 V
MDC IDC MSMT LEADCHNL RA IMPEDANCE VALUE: 418 Ohm
MDC IDC MSMT LEADCHNL RA PACING THRESHOLD AMPLITUDE: 0.875 V
MDC IDC MSMT LEADCHNL RA PACING THRESHOLD PULSEWIDTH: 0.4 ms
MDC IDC MSMT LEADCHNL RV IMPEDANCE VALUE: 418 Ohm
MDC IDC SESS DTM: 20151202190430
MDC IDC SET LEADCHNL RA PACING AMPLITUDE: 2 V
MDC IDC SET ZONE DETECTION INTERVAL: 240 ms
MDC IDC SET ZONE DETECTION INTERVAL: 370 ms
MDC IDC SET ZONE DETECTION INTERVAL: 450 ms
MDC IDC STAT BRADY RA PERCENT PACED: 27.79 %
MDC IDC STAT BRADY RV PERCENT PACED: 0.05 %
Zone Setting Detection Interval: 300 ms
Zone Setting Detection Interval: 350 ms

## 2014-09-18 MED ORDER — CARVEDILOL 6.25 MG PO TABS: ORAL_TABLET | ORAL | Status: DC

## 2014-09-18 MED ORDER — SIMVASTATIN 20 MG PO TABS: ORAL_TABLET | ORAL | Status: DC

## 2014-09-18 MED ORDER — PANTOPRAZOLE SODIUM 40 MG PO TBEC: 40.0000 mg | DELAYED_RELEASE_TABLET | Freq: Every day | ORAL | Status: DC

## 2014-09-18 MED ORDER — CLOPIDOGREL BISULFATE 75 MG PO TABS: 75.0000 mg | ORAL_TABLET | Freq: Every day | ORAL | Status: DC

## 2014-09-18 MED ORDER — LISINOPRIL 2.5 MG PO TABS: 2.5000 mg | ORAL_TABLET | Freq: Every day | ORAL | Status: DC

## 2014-09-18 NOTE — Patient Instructions (Addendum)
Your physician recommends that you continue on your current medications as directed. Please refer to the Current Medication list given to you today.  Your physician recommends that you schedule a follow-up appointment in: 3 months with device clinic.  Your physician wants you to follow-up in: 6 months  with Dr. [name].  You will receive a reminder letter in the mail two months in advance. If you don't receive a letter, please call our office to schedule the follow-up appointment.

## 2014-09-18 NOTE — Progress Notes (Signed)
Patient Care Team: [name], MD as PCP - General (Internal Medicine) Danie Binder, MD as Attending Physician (Gastroenterology)   HPI  [name] is a 73 y.o. female Seen in followup for congestive heart failure in the setting of ischemic cardiomyopathy with previously implanted ICD. She was developing chest pain in the fall. Myoview scan was abnormal. She underwent catheterization. This demonstrated 70% proximal right 80% proximal LAD and a 95% left main. EF was 35% and she underwent bypass surgery  Of June 2014 her 6949 lead fractured requiring replacement. At that time she received a new atrial lead 2/2 bradycardia and a new generator. T  With exertion she gets fatigued and lightheaded but has no shortness of breath or chest pain; she also had shower lightheadedness and orthostatic lightheadedness. Her diet is very unusual and she fasts frequently because of her many  Gods.  We decreased some of her afterload reducing medication. Trying to mitigate the symptoms THIS HAS been some better    KAMCHO=HELLO AB CHO= GOOD BYE    Past Medical History  Diagnosis Date  . PSVT (paroxysmal supraventricular tachycardia)   . Ischemic cardiomyopathy     Anterior MI 2005 with primary PCI: cath 2013Cardiac catheterization showed a heavily calcified left main with a long 95% stenosis. The LAD had 80% proximal stenosis. The right coronary was a large dominant vessel with proximal 70% stenosis and 50% distal stenosis before the bifurcation. Left ventricular ejection fraction was 35% with akinesis of the anterior apical wall  . Chronic systolic heart failure   . 9833 lead 09/06/2011  . Coronary artery disease   . History of blood transfusion     "after heart surgery" (03/21/2013)  . GERD (gastroesophageal reflux disease)   . Myocardial infarction 02/18/2004    Archie Endo 02/18/2004 (03/20/2013)  . Automatic implantable cardioverter-defibrillator in situ 06/24/2004    Archie Endo 06/24/2004  (03/21/2013)    Past Surgical History  Procedure Laterality Date  . Cardiac defibrillator placement      [employer]tr[employer]  . Intraoperative transesophageal echocardiogram  11/03/2012    Procedure: INTRAOPERATIVE TRANSESOPHAGEAL ECHOCARDIOGRAM;  Surgeon: Gaye Pollack, MD;  Location: [facility] OR;  Service: Open Heart Surgery;  Laterality: N/A;  . Coronary artery bypass graft  11/03/2012    Procedure: CORONARY ARTERY BYPASS GRAFTING (CABG);  Surgeon: Gaye Pollack, MD;  Location: [place];  Service: Open Heart Surgery;  Laterality: N/A;  . Cataract extraction w/ intraocular lens  implant, bilateral Bilateral ~2008  . Knee arthroscopy Left 02/19/2010    Archie Endo 02/19/2010 (03/21/2013)  . Coronary angioplasty  02/18/2004    Archie Endo 02/18/2004 (03/21/2013)  . Colonoscopy N/A 02/26/2014    Procedure: COLONOSCOPY;  Surgeon: Danie Binder, MD;  [facility];  Service: Endoscopy;  Laterality: N/A;  1:00    Current Outpatient Prescriptions  Medication Sig Dispense Refill  . aspirin EC 81 MG tablet Take 1 tablet (81 mg total) by mouth daily.    . benzonatate (TESSALON) 100 MG capsule Take 1 capsule (100 mg total) by mouth 3 (three) times daily as needed for cough. 20 capsule 0  . carvedilol (COREG) 6.25 MG tablet Take 1/2 tablet in the morning. Take 1 tablet in the evening. 45 tablet 2  . diclofenac sodium (VOLTAREN) 1 % GEL Apply 1 application topically. As needed    . gabapentin (NEURONTIN) 100 MG capsule Take 100 mg by mouth at bedtime.    . hydrocortisone (ANUSOL-HC) 2.5 % rectal cream  Place 1 application rectally 2 (two) times daily. 30 g 1  . hydrocortisone (ANUSOL-HC) 25 MG suppository Place 25 mg rectally 2 (two) times daily.    [name] Kitchen lisinopril (PRINIVIL,ZESTRIL) 2.5 MG tablet Take 1 tablet (2.5 mg total) by mouth at bedtime. 90 tablet 0  . lisinopril (PRINIVIL,ZESTRIL) 2.5 MG tablet Take 1 tablet (2.5 mg total) by mouth at bedtime. 90 tablet 0  . metFORMIN (GLUCOPHAGE) 500 MG tablet Take 500 mg by mouth daily.     . nitroGLYCERIN (NITROSTAT) 0.4 MG SL tablet Place 1 tablet (0.4 mg total) under the tongue every 5 (five) minutes as needed for chest pain. 25 tablet 0  . omeprazole (PRILOSEC) 20 MG capsule Take 20 mg by mouth as needed.     . simvastatin (ZOCOR) 20 MG tablet TAKE ONE TABLET TWO TIMES PER WEEK 30 tablet 0   No current facility-administered medications for this visit.    No Known Allergies  Review of Systems negative except from HPI and PMH  Physical Exam There were no vitals taken for this visit. Well developed and well nourished in no acute distress HENT normal E scleral and icterus clear Neck Supple JVP flat; carotids brisk and full Clear to ausculation Device pocket without tenderness but not erythematous *Regular rate and rhythm, no murmurs gallops or rub Soft with active bowel sounds No clubbing cyanosis  Edema Alert and oriented, grossly normal motor and sensory function Skin Warm and Dry    Assessment and  Plan  Ischemic cardiomyopathy  CRT-D  Systolic heart failure   Orthostatic lightheadedness  Myalgias  From cardiac point of view, she is doing well  Will continue curent cardiac meds  Her myalgias resolved with the discontinuation of her statins; we'll try and resume it twice a week.  We will decrease her carvedilol 6.25 twice a day--3.125/6.25 and have her take her ACE inhibitor at night to try to mitigate some of her orthostasis. She is encouraged to walk more.  Without symptoms of ischemia

## 2014-09-23 ENCOUNTER — Encounter: Payer: Self-pay | Admitting: Internal Medicine

## 2014-09-26 ENCOUNTER — Encounter (HOSPITAL_COMMUNITY): Payer: Self-pay | Admitting: Cardiovascular Disease

## 2014-10-23 LAB — MDC_IDC_ENUM_SESS_TYPE_INCLINIC
Brady Statistic AS VP Percent: 0.01 %
Brady Statistic AS VS Percent: 72.2 %
Brady Statistic RA Percent Paced: 27.79 %
Brady Statistic RV Percent Paced: 0.05 %
HIGH POWER IMPEDANCE MEASURED VALUE: 190 Ohm
HighPow Impedance: 55 Ohm
Lead Channel Impedance Value: 418 Ohm
Lead Channel Impedance Value: 418 Ohm
Lead Channel Pacing Threshold Amplitude: 0.625 V
Lead Channel Pacing Threshold Amplitude: 0.875 V
Lead Channel Pacing Threshold Pulse Width: 0.4 ms
Lead Channel Sensing Intrinsic Amplitude: 0.75 mV
Lead Channel Sensing Intrinsic Amplitude: 13.25 mV
Lead Channel Sensing Intrinsic Amplitude: 13.25 mV
Lead Channel Setting Pacing Amplitude: 2.5 V
Lead Channel Setting Pacing Pulse Width: 0.4 ms
MDC IDC MSMT BATTERY REMAINING LONGEVITY: 116 mo
MDC IDC MSMT BATTERY VOLTAGE: 3.03 V
MDC IDC MSMT LEADCHNL RA PACING THRESHOLD PULSEWIDTH: 0.4 ms
MDC IDC MSMT LEADCHNL RA SENSING INTR AMPL: 0.75 mV
MDC IDC SESS DTM: 20151202190430
MDC IDC SET LEADCHNL RA PACING AMPLITUDE: 2 V
MDC IDC SET LEADCHNL RV SENSING SENSITIVITY: 0.3 mV
MDC IDC SET ZONE DETECTION INTERVAL: 350 ms
MDC IDC SET ZONE DETECTION INTERVAL: 370 ms
MDC IDC SET ZONE DETECTION INTERVAL: 450 ms
MDC IDC STAT BRADY AP VP PERCENT: 0.04 %
MDC IDC STAT BRADY AP VS PERCENT: 27.75 %
Zone Setting Detection Interval: 240 ms
Zone Setting Detection Interval: 300 ms

## 2014-10-24 ENCOUNTER — Telehealth: Payer: Self-pay | Admitting: Internal Medicine

## 2014-10-24 NOTE — Telephone Encounter (Signed)
NEw Message  Pt daughter in law requests to speak with Rn. Please call back and discuss.

## 2014-10-24 NOTE — Telephone Encounter (Signed)
Called stating that patient has been in [place] for 3 weeks and returns tomorrow. States that something is wrong with [name] and needs an appt with Dr. [name]. I asked what exactly the problem is and she is not sure. Advised to call us back tomorrow once they can speak with the patient and find out specifically what is wrong. Explained that until I know what the exact issue I cannot determine whether patient needs to be seen by Dr. [name] or another physician. They will call back tomorrow with further details.

## 2014-10-25 ENCOUNTER — Emergency Department (HOSPITAL_COMMUNITY): Payer: Medicare Other

## 2014-10-25 ENCOUNTER — Emergency Department (HOSPITAL_COMMUNITY)
Admission: EM | Admit: 2014-10-25 | Discharge: 2014-10-26 | Disposition: A | Discharge status: Home / Self Care | Payer: Medicare Other | Attending: Emergency Medicine | Admitting: Emergency Medicine

## 2014-10-25 ENCOUNTER — Encounter (HOSPITAL_COMMUNITY): Payer: Self-pay | Admitting: *Deleted

## 2014-10-25 DIAGNOSIS — R5383 Other fatigue: Secondary | ICD-10-CM | POA: Diagnosis not present

## 2014-10-25 DIAGNOSIS — Z87891 Personal history of nicotine dependence: Secondary | ICD-10-CM | POA: Diagnosis not present

## 2014-10-25 DIAGNOSIS — Z7902 Long term (current) use of antithrombotics/antiplatelets: Secondary | ICD-10-CM | POA: Insufficient documentation

## 2014-10-25 DIAGNOSIS — Z951 Presence of aortocoronary bypass graft: Secondary | ICD-10-CM | POA: Diagnosis not present

## 2014-10-25 DIAGNOSIS — I255 Ischemic cardiomyopathy: Secondary | ICD-10-CM | POA: Insufficient documentation

## 2014-10-25 DIAGNOSIS — Z7982 Long term (current) use of aspirin: Secondary | ICD-10-CM | POA: Diagnosis not present

## 2014-10-25 DIAGNOSIS — R531 Weakness: Secondary | ICD-10-CM | POA: Diagnosis not present

## 2014-10-25 DIAGNOSIS — R079 Chest pain, unspecified: Secondary | ICD-10-CM | POA: Insufficient documentation

## 2014-10-25 DIAGNOSIS — Z7952 Long term (current) use of systemic steroids: Secondary | ICD-10-CM | POA: Diagnosis not present

## 2014-10-25 DIAGNOSIS — R2240 Localized swelling, mass and lump, unspecified lower limb: Secondary | ICD-10-CM | POA: Diagnosis not present

## 2014-10-25 DIAGNOSIS — I5022 Chronic systolic (congestive) heart failure: Secondary | ICD-10-CM | POA: Insufficient documentation

## 2014-10-25 DIAGNOSIS — K219 Gastro-esophageal reflux disease without esophagitis: Secondary | ICD-10-CM | POA: Diagnosis not present

## 2014-10-25 DIAGNOSIS — I2510 Atherosclerotic heart disease of native coronary artery without angina pectoris: Secondary | ICD-10-CM | POA: Insufficient documentation

## 2014-10-25 DIAGNOSIS — I509 Heart failure, unspecified: Secondary | ICD-10-CM

## 2014-10-25 LAB — BASIC METABOLIC PANEL
Anion gap: 6 (ref 5–15)
BUN: 11 mg/dL (ref 6–23)
CO2: 24 mmol/L (ref 19–32)
Calcium: 8.8 mg/dL (ref 8.4–10.5)
Chloride: 112 mEq/L (ref 96–112)
Creatinine, Ser: 0.9 mg/dL (ref 0.50–1.10)
GFR calc Af Amer: 73 mL/min — ABNORMAL LOW (ref >90)
GFR calc non Af Amer: 63 mL/min — ABNORMAL LOW (ref >90)
Glucose, Bld: 137 mg/dL — ABNORMAL HIGH (ref 70–99)
Potassium: 3.5 mmol/L (ref 3.5–5.1)
Sodium: 142 mmol/L (ref 135–145)

## 2014-10-25 LAB — CBC WITH DIFFERENTIAL/PLATELET
Basophils Absolute: 0.1 10*3/uL (ref 0.0–0.1)
Basophils Relative: 1 % (ref 0–1)
Eosinophils Absolute: 0.2 10*3/uL (ref 0.0–0.7)
Eosinophils Relative: 3 % (ref 0–5)
HCT: 30.4 % — ABNORMAL LOW (ref 36.0–46.0)
Hemoglobin: 9.4 g/dL — ABNORMAL LOW (ref 12.0–15.0)
Lymphocytes Relative: 30 % (ref 12–46)
Lymphs Abs: 1.7 10*3/uL (ref 0.7–4.0)
MCH: 23.6 pg — ABNORMAL LOW (ref 26.0–34.0)
MCHC: 30.9 g/dL (ref 30.0–36.0)
MCV: 76.4 fL — ABNORMAL LOW (ref 78.0–100.0)
Monocytes Absolute: 0.5 10*3/uL (ref 0.1–1.0)
Monocytes Relative: 9 % (ref 3–12)
Neutro Abs: 3.3 10*3/uL (ref 1.7–7.7)
Neutrophils Relative %: 57 % (ref 43–77)
Platelets: 258 10*3/uL (ref 150–400)
RBC: 3.98 MIL/uL (ref 3.87–5.11)
RDW: 16.6 % — ABNORMAL HIGH (ref 11.5–15.5)
WBC: 5.8 10*3/uL (ref 4.0–10.5)

## 2014-10-25 LAB — TROPONIN I: Troponin I: 0.03 ng/mL (ref <0.031)

## 2014-10-25 MED ORDER — POTASSIUM CHLORIDE ER 20 MEQ PO TBCR: 20.0000 meq | EXTENDED_RELEASE_TABLET | Freq: Two times a day (BID) | ORAL | Status: DC

## 2014-10-25 MED ORDER — FUROSEMIDE 10 MG/ML IJ SOLN
30.0000 mg | Freq: Once | INTRAMUSCULAR | Status: AC
  Administered 2014-10-25: 30 mg via INTRAVENOUS
  Filled 2014-10-25: qty 4

## 2014-10-25 MED ORDER — FUROSEMIDE 20 MG PO TABS: 20.0000 mg | ORAL_TABLET | Freq: Two times a day (BID) | ORAL | Status: DC

## 2014-10-25 MED ORDER — POTASSIUM CHLORIDE CRYS ER 20 MEQ PO TBCR
40.0000 meq | EXTENDED_RELEASE_TABLET | Freq: Once | ORAL | Status: AC
  Administered 2014-10-25: 40 meq via ORAL
  Filled 2014-10-25: qty 2

## 2014-10-25 NOTE — ED Notes (Signed)
Pt flew from [place] in today, c/o left chest possible sharp in nature, sob with ems but none at this time

## 2014-10-25 NOTE — Discharge Instructions (Signed)

## 2014-10-25 NOTE — ED Notes (Signed)
Pt with hx of CHF, received 324 mg ASA by [facility]

## 2014-10-25 NOTE — ED Provider Notes (Signed)
CSN: [id number]     Arrival date & time 10/25/14  2208 History  This chart was scribed for Virgel Manifold, MD by Chester Holstein, ED Scribe. This patient was seen in room APA07/AP[facility]07 and the patient's care was started at 10:30 PM.    Chief Complaint  Patient presents with  . Chest Pain      Patient is a 74 y.o. female presenting with chest pain. The history is provided by the patient and a relative. No language interpreter was used.  Chest Pain Associated symptoms: cough, fatigue, shortness of breath and weakness     HPI Comments: [name] is a 74 y.o. female with PMHx of CHF who presents to the Emergency Department complaining of SOB.  Pt's daughters are here to translate. Pt's daughter notes they just returned from [place]. Pt notes associated weakness, fatigue, swelling in feet bilaterally for last few days, dyspnea when laying down, and cough. Daughter states she was seen for chest pain while in [place]. Daughter notes she has been taking all of her medication but is not on a fluid pill. Pt denies urinary changes. Pt sees Dr. [name].   Past Medical History  Diagnosis Date  . PSVT (paroxysmal supraventricular tachycardia)   . Ischemic cardiomyopathy     Anterior MI 2005 with primary PCI: cath 2013Cardiac catheterization showed a heavily calcified left main with a long 95% stenosis. The LAD had 80% proximal stenosis. The right coronary was a large dominant vessel with proximal 70% stenosis and 50% distal stenosis before the bifurcation. Left ventricular ejection fraction was 35% with akinesis of the anterior apical wall  . Chronic systolic heart failure   . 4259 lead 09/06/2011  . Coronary artery disease   . History of blood transfusion     "after heart surgery" (03/21/2013)  . GERD (gastroesophageal reflux disease)   . Myocardial infarction 02/18/2004    Archie Endo 02/18/2004 (03/20/2013)  . Automatic implantable cardioverter-defibrillator in situ 06/24/2004    Archie Endo 06/24/2004 (03/21/2013)    Past Surgical History  Procedure Laterality Date  . Cardiac defibrillator placement      [employer]tr[employer]  . Intraoperative transesophageal echocardiogram  11/03/2012    Procedure: INTRAOPERATIVE TRANSESOPHAGEAL ECHOCARDIOGRAM;  Surgeon: Gaye Pollack, MD;  Location[facility];  Service: Open Heart Surgery;  Laterality: N/A;  . Coronary artery bypass graft  11/03/2012    Procedure: CORONARY ARTERY BYPASS GRAFTING (CABG);  Surgeon: Gaye Pollack, MD;  Location: [place];  Service: Open Heart Surgery;  Laterality: N/A;  . Cataract extraction w/ intraocular lens  implant, bilateral Bilateral ~2008  . Knee arthroscopy Left 02/19/2010    Archie Endo 02/19/2010 (03/21/2013)  . Coronary angioplasty  02/18/2004    Archie Endo 02/18/2004 (03/21/2013)  . Colonoscopy N/A 02/26/2014    Procedure: COLONOSCOPY;  Surgeon: Danie Binder, MD;  [facility];  Service: Endoscopy;  Laterality: N/A;  1:00  . Left and right heart catheterization with coronary angiogram N/A 10/30/2012    Procedure: LEFT AND RIGHT HEART CATHETERIZATION WITH CORONARY ANGIOGRAM;  Surgeon: Sherren Mocha, MD;  Location: [facility] CATH LAB;  Service: Cardiovascular;  Laterality: N/A;  . Lead revision N/A 03/22/2013    Procedure: LEAD REVISION;  Surgeon: Evans Lance, MD;  Location: [facility] CATH LAB;  Service: Cardiovascular;  Laterality: N/A;   Family History  Problem Relation Age of Onset  . Colon cancer Neg Hx    History  Substance Use Topics  . Smoking status: Former Research scientist (life sciences)  . Smokeless tobacco: Never Used  Comment: Never smoked  . Alcohol Use: No   OB History    No data available     Review of Systems  Constitutional: Positive for fatigue.  Respiratory: Positive for cough and shortness of breath.   Cardiovascular: Positive for chest pain and leg swelling.  Genitourinary: Negative for difficulty urinating.  Neurological: Positive for weakness.  All other systems reviewed and are negative.     Allergies  Review of patient's allergies  indicates no known allergies.  Home Medications   Prior to Admission medications   Medication Sig Start Date End Date Taking? Authorizing Provider  aspirin EC 81 MG tablet Take 1 tablet (81 mg total) by mouth daily. 12/06/12   Deboraha Sprang, MD  benzonatate (TESSALON) 100 MG capsule Take 1 capsule (100 mg total) by mouth 3 (three) times daily as needed for cough. 11/10/12   Coolidge Breeze, PA-C  carvedilol (COREG) 6.25 MG tablet Take 1/2 tablet in the morning. Take 1 tablet in the evening. 09/18/14   Deboraha Sprang, MD  clopidogrel (PLAVIX) 75 MG tablet Take 1 tablet (75 mg total) by mouth daily. 09/18/14   Deboraha Sprang, MD  diclofenac sodium (VOLTAREN) 1 % GEL Apply 1 application topically. As needed    Historical Provider, MD  hydrocortisone (ANUSOL-HC) 2.5 % rectal cream Place 1 application rectally 2 (two) times daily. 05/23/14   Danie Binder, MD  lisinopril (PRINIVIL,ZESTRIL) 2.5 MG tablet Take 1 tablet (2.5 mg total) by mouth at bedtime. 09/18/14   Deboraha Sprang, MD  metFORMIN (GLUCOPHAGE) 500 MG tablet Take 500 mg by mouth daily.    Historical Provider, MD  NITROSTAT 0.4 MG SL tablet PLACE ONE TABLET UNDER THE TONGUE EVERY FIVE MINUTES AS NEEDED FOR CHEST PAIN 09/18/14   Deboraha Sprang, MD  pantoprazole (PROTONIX) 40 MG tablet Take 1 tablet (40 mg total) by mouth daily. 09/18/14   Deboraha Sprang, MD  simvastatin (ZOCOR) 20 MG tablet TAKE ONE TABLET TWO TIMES PER WEEK 09/18/14   Deboraha Sprang, MD   BP 126/63 mmHg  Pulse 78  Temp(Src) 97.8 F (36.6 C)  Resp 20  Ht 5' (1.524 m)  Wt 125 lb (56.7 kg)  BMI 24.41 kg/m2  SpO2 100% Physical Exam  Constitutional: She appears well-developed and well-nourished. No distress.  HENT:  Head: Normocephalic and atraumatic.  Eyes: Conjunctivae are normal. Right eye exhibits no discharge. Left eye exhibits no discharge.  Neck: Neck supple.  Cardiovascular: Normal rate, regular rhythm and normal heart sounds.  Exam reveals no gallop and no friction  rub.   No murmur heard. Pulmonary/Chest: Effort normal. No respiratory distress. Rales: bilaterally.  Crackles at bases  Abdominal: Soft. She exhibits no distension. There is no tenderness.  Musculoskeletal: She exhibits no edema or tenderness.  Symmetric pitting lower extremity edema  Neurological: She is alert.  Skin: Skin is warm and dry.  Psychiatric: She has a normal mood and affect. Her behavior is normal. Thought content normal.  Nursing note and vitals reviewed.   ED Course  Procedures (including critical care time) DIAGNOSTIC STUDIES: Oxygen Saturation is 100% on room air, normal by my interpretation.    COORDINATION OF CARE: 10:42 PM Discussed treatment plan with patient at beside, the patient agrees with the plan and has no further questions at this time.   Labs Review Labs Reviewed  CBC WITH DIFFERENTIAL - Abnormal; Notable for the following:    Hemoglobin 9.4 (*)    HCT 30.4 (*)  MCV 76.4 (*)    MCH 23.6 (*)    RDW 16.6 (*)    All other components within normal limits  BASIC METABOLIC PANEL  TROPONIN I    Imaging Review No results found.   EKG Interpretation   Date/Time:  Friday October 25 2014 22:17:42 EST Ventricular Rate:  77 PR Interval:  142 QRS Duration: 83 QT Interval:  457 QTC Calculation: 517 R Axis:   98 Text Interpretation:  Sinus rhythm Right axis deviation Nonspecific T  abnormalities, lateral leads No significant change since last tracing  Confirmed by [place]  MD, Marciel Offenberger ([id number]) on 10/25/2014 11:09:22 PM      MDM   Final diagnoses:  Chest pain  Heart failure, unspecified heart failure chronicity, unspecified heart failure type   73yF with dyspnea. Has hx of congestive heart failure in the setting of ischemic cardiomyopathy with previously implanted ICD. CABG just about 2 years ago after having CP and subsequent cath showing 70% stenosis proximal right 80% proximal LAD and a 95% left main. Seen by her cardiologist Dr [name] 09/2014.  Carvedilol decreased then from 6.25 BID to 3.125/6.25 because of some orthostatic symptoms. Otherwise doing pretty well until a few days ago just before leaving from trip to [place]. Having orthopnea, fatigue, peripheral edema and new cough. Has crackles on exam. Family reports seen in [place] just before departure and had some testing, but unsure of specifics. Apparently was having some CP at that time but none since.   She appears well. She was actually laying supine on her stretcher when I first entered the room and did not appear uncomfortable. Oxygen saturations normal on room air. BNP initially ordered but not enough blood from initial blood draw to run it and did not feel she needed stuck again just to obtain one.  Microcytic anemia noted. Appears to have some baseline anemia. Worsening may be dilutional to some degree. Has not had CP in a couple days. EKG essentially unchanged from last month. Trop normal. She is normotensive. CXR showing new interstitial edema. Bibasilar opacities noted. Clinically this is not pneumonia. Abx were deferred. Given dose of IV lasix in ED. Will discharge with prescriptions for a few more days. I feel she is appropriate for discharge at this time. Can touch base with cardiology on Monday. Return precautions discussed with her/family at bedside.   I personally preformed the services scribed in my presence. The recorded information has been reviewed is accurate. Virgel Manifold, MD.     Virgel Manifold, MD 10/28/14 1037

## 2014-10-28 ENCOUNTER — Telehealth: Payer: Self-pay | Admitting: Internal Medicine

## 2014-10-28 NOTE — Telephone Encounter (Signed)
Patient feeling a little better. Denies SOB or swelling at this time. Still feeling weak, tired, fatigued. Informed that will review with Dr. [name] and let them know plan of care.  (Dr. [name] do you want follow up w/ PA/NP) ( I see no echo in system to review)

## 2014-10-28 NOTE — Telephone Encounter (Signed)
New problem   Pt granddaughter calling   Pt was seen in ER this weekend and was told to call office today to get an appt with Dr [name]. Pt has fluid retention/weak/fatigue. Please advise pt's granddaughter.

## 2014-10-29 NOTE — Telephone Encounter (Signed)
PA echo both good ideas

## 2014-11-13 ENCOUNTER — Other Ambulatory Visit: Payer: Self-pay | Admitting: Internal Medicine

## 2014-11-15 ENCOUNTER — Ambulatory Visit (INDEPENDENT_AMBULATORY_CARE_PROVIDER_SITE_OTHER): Payer: Medicare Other | Admitting: Internal Medicine

## 2014-11-15 ENCOUNTER — Encounter: Payer: Self-pay | Admitting: Internal Medicine

## 2014-11-15 VITALS — BP 128/60 | HR 75 | Ht 64.0 in | Wt 123.0 lb

## 2014-11-15 DIAGNOSIS — I255 Ischemic cardiomyopathy: Secondary | ICD-10-CM

## 2014-11-15 DIAGNOSIS — R079 Chest pain, unspecified: Secondary | ICD-10-CM

## 2014-11-15 DIAGNOSIS — I5022 Chronic systolic (congestive) heart failure: Secondary | ICD-10-CM

## 2014-11-15 DIAGNOSIS — Z4502 Encounter for adjustment and management of automatic implantable cardiac defibrillator: Secondary | ICD-10-CM

## 2014-11-15 LAB — MDC_IDC_ENUM_SESS_TYPE_INCLINIC
Battery Remaining Longevity: 114 mo
Brady Statistic AP VP Percent: 0.02 %
Brady Statistic AS VP Percent: 0.01 %
Brady Statistic AS VS Percent: 83.4 %
Brady Statistic RV Percent Paced: 0.03 %
Date Time Interrogation Session: 20160129161953
HIGH POWER IMPEDANCE MEASURED VALUE: 228 Ohm
HighPow Impedance: 54 Ohm
Lead Channel Impedance Value: 456 Ohm
Lead Channel Pacing Threshold Amplitude: 0.75 V
Lead Channel Pacing Threshold Pulse Width: 0.4 ms
Lead Channel Pacing Threshold Pulse Width: 0.4 ms
Lead Channel Sensing Intrinsic Amplitude: 0.75 mV
Lead Channel Sensing Intrinsic Amplitude: 1.375 mV
Lead Channel Sensing Intrinsic Amplitude: 13.75 mV
Lead Channel Setting Pacing Amplitude: 2.5 V
Lead Channel Setting Pacing Pulse Width: 0.4 ms
Lead Channel Setting Sensing Sensitivity: 0.3 mV
MDC IDC MSMT BATTERY VOLTAGE: 3.03 V
MDC IDC MSMT LEADCHNL RV IMPEDANCE VALUE: 418 Ohm
MDC IDC MSMT LEADCHNL RV PACING THRESHOLD AMPLITUDE: 0.75 V
MDC IDC MSMT LEADCHNL RV SENSING INTR AMPL: 18.125 mV
MDC IDC SET LEADCHNL RA PACING AMPLITUDE: 2 V
MDC IDC SET ZONE DETECTION INTERVAL: 240 ms
MDC IDC SET ZONE DETECTION INTERVAL: 300 ms
MDC IDC SET ZONE DETECTION INTERVAL: 350 ms
MDC IDC STAT BRADY AP VS PERCENT: 16.57 %
MDC IDC STAT BRADY RA PERCENT PACED: 16.59 %
Zone Setting Detection Interval: 370 ms
Zone Setting Detection Interval: 450 ms

## 2014-11-15 MED ORDER — POTASSIUM CHLORIDE ER 20 MEQ PO TBCR
20.0000 meq | EXTENDED_RELEASE_TABLET | Freq: Every day | ORAL | Status: DC
Start: 2014-11-15 — End: 2015-05-12

## 2014-11-15 MED ORDER — FUROSEMIDE 20 MG PO TABS: 40.0000 mg | ORAL_TABLET | Freq: Every day | ORAL | Status: DC

## 2014-11-15 MED ORDER — CARVEDILOL 6.25 MG PO TABS: ORAL_TABLET | ORAL | Status: DC

## 2014-11-15 MED ORDER — LISINOPRIL 2.5 MG PO TABS: 2.5000 mg | ORAL_TABLET | Freq: Every day | ORAL | Status: DC

## 2014-11-15 NOTE — Progress Notes (Signed)
Patient Care Team: [name], MD as PCP - General (Internal Medicine) Danie Binder, MD as Attending Physician (Gastroenterology)   HPI  [name] is a 74 y.o. female Seen in followup for congestive heart failure in the setting of ischemic cardiomyopathy with previously implanted ICD. She was developing chest pain in the fall. Myoview scan was abnormal. She underwent catheterization. This demonstrated 70% proximal right 80% proximal LAD and a 95% left main. EF was 35% and she underwent bypass surgery  Of June 2014 her 6949 lead fractured requiring replacement. At that time she received a new atrial lead 2/2 bradycardia and a new generator.    She was seen in the emergency room 10/25/14 following a trip to [place]. She had problems apparently with shortness of breath edema and chest pain. She  Had beenseen in [place].  Her ER evaluation here was notable for a chest x-ray consistent with pulmonary edema. He was treated with IV Lasix. Troponins were normal and ECG was apparently unchanged. Her anemia was noted and was at baseline.     KAM CHO=HELLO ough JO = GOOD BYE ABAR=- thank you    Past Medical History  Diagnosis Date  . PSVT (paroxysmal supraventricular tachycardia)   . Ischemic cardiomyopathy     Anterior MI 2005 with primary PCI: cath 2013Cardiac catheterization showed a heavily calcified left main with a long 95% stenosis. The LAD had 80% proximal stenosis. The right coronary was a large dominant vessel with proximal 70% stenosis and 50% distal stenosis before the bifurcation. Left ventricular ejection fraction was 35% with akinesis of the anterior apical wall  . Chronic systolic heart failure   . 1610 lead 09/06/2011  . Coronary artery disease   . History of blood transfusion     "after heart surgery" (03/21/2013)  . GERD (gastroesophageal reflux disease)   . Myocardial infarction 02/18/2004    Archie Endo 02/18/2004 (03/20/2013)  . Automatic implantable  cardioverter-defibrillator in situ 06/24/2004    Archie Endo 06/24/2004 (03/21/2013)    Past Surgical History  Procedure Laterality Date  . Cardiac defibrillator placement      Medtronic  . Intraoperative transesophageal echocardiogram  11/03/2012    Procedure: INTRAOPERATIVE TRANSESOPHAGEAL ECHOCARDIOGRAM;  Surgeon: Gaye Pollack, MD;  Location: [facility] OR;  Service: Open Heart Surgery;  Laterality: N/A;  . Coronary artery bypass graft  11/03/2012    Procedure: CORONARY ARTERY BYPASS GRAFTING (CABG);  Surgeon: Gaye Pollack, MD;  Location: [facility];  Service: Open Heart Surgery;  Laterality: N/A;  . Cataract extraction w/ intraocular lens  implant, bilateral Bilateral ~2008  . Knee arthroscopy Left 02/19/2010    Archie Endo 02/19/2010 (03/21/2013)  . Coronary angioplasty  02/18/2004    Archie Endo 02/18/2004 (03/21/2013)  . Colonoscopy N/A 02/26/2014    Procedure: COLONOSCOPY;  Surgeon: Danie Binder, MD;  Location: [facility];  Service: Endoscopy;  Laterality: N/A;  1:00  . Left and right heart catheterization with coronary angiogram N/A 10/30/2012    Procedure: LEFT AND RIGHT HEART CATHETERIZATION WITH CORONARY ANGIOGRAM;  Surgeon: Sherren Mocha, MD;  Location: [facility] CATH LAB;  Service: Cardiovascular;  Laterality: N/A;  . Lead revision N/A 03/22/2013    Procedure: LEAD REVISION;  Surgeon: Evans Lance, MD;  Location: [facility] CATH LAB;  Service: Cardiovascular;  Laterality: N/A;    Current Outpatient Prescriptions  Medication Sig Dispense Refill  . aspirin EC 81 MG tablet Take 1 tablet (81 mg total) by mouth daily.    [name] Kitchen  benzonatate (TESSALON) 100 MG capsule Take 1 capsule (100 mg total) by mouth 3 (three) times daily as needed for cough. 20 capsule 0  . carvedilol (COREG) 6.25 MG tablet Take 1/2 tablet in the morning. Take 1 tablet in the evening. (Patient taking differently: Take 3.125-6.25 mg by mouth 2 (two) times daily with a meal. Take 1/2 tablet in the morning. Take 1 tablet in the evening.) 45 tablet 11  . clopidogrel  (PLAVIX) 75 MG tablet Take 1 tablet (75 mg total) by mouth daily. 30 tablet 11  . diclofenac sodium (VOLTAREN) 1 % GEL Apply 1 application topically daily as needed (for pain). As needed    . furosemide (LASIX) 20 MG tablet Take 1 tablet (20 mg total) by mouth 2 (two) times daily. 6 tablet 0  . hydrocortisone (ANUSOL-HC) 2.5 % rectal cream Place 1 application rectally 2 (two) times daily. 30 g 1  . lisinopril (PRINIVIL,ZESTRIL) 2.5 MG tablet Take 1 tablet (2.5 mg total) by mouth at bedtime. 30 tablet 11  . metFORMIN (GLUCOPHAGE) 500 MG tablet Take 500 mg by mouth daily.    . Multiple Vitamin (MULTIVITAMIN WITH MINERALS) TABS tablet Take 1 tablet by mouth daily.    [name] Kitchen NITROSTAT 0.4 MG SL tablet PLACE ONE TABLET UNDER THE TONGUE EVERY FIVE MINUTES AS NEEDED FOR CHEST PAIN 25 tablet 5  . pantoprazole (PROTONIX) 40 MG tablet Take 1 tablet (40 mg total) by mouth daily. 30 tablet 11  . potassium chloride 20 MEQ TBCR Take 20 mEq by mouth 2 (two) times daily. 6 tablet 0  . simvastatin (ZOCOR) 20 MG tablet TAKE ONE TABLET TWO TIMES PER WEEK 8 tablet 11   No current facility-administered medications for this visit.    No Known Allergies  Review of Systems negative except from HPI and PMH  Physical Exam BP 128/60 mmHg  Pulse 75  Ht 5\' 4"  (1.626 m)  Wt 123 lb (55.792 kg)  BMI 21.10 kg/m2 Well developed and well nourished in no acute distress HENT normal E scleral and icterus clear Neck Supple JVP flat; carotids brisk and full Clear to ausculation Device pocket without tenderness but not erythematous *Regular rate and rhythm, no murmurs gallops or rub Soft with active bowel sounds No clubbing cyanosis  Edema  discolored nails-black Alert and oriented, grossly normal motor and sensory function Skin Warm and Dry  ECG demonstrates sinus rhythm at 75 Intervals 14/08/38 they're frequent PACs with some aberration  Assessment and  Plan  Ischemic cardiomyopathy  ICD-Medtronic ,The patient's  device was interrogated.  The information was reviewed. No changes were made in the programming.     Atrial tachycardia  detected on her device. Nonsustained.  Systolic heart failure chronic acute\  Discolored nails    She has evidence of volume overload. We will resume her diuretics that were associated with significant post ER visit improvement. We will plan to check a metabolic profile in about 6 weeks time. See her in 3 months. Her chest pains are atypical. We will undertake a Lexiscan Myoview which we can do in [place]. This will give [place] both estimates of coronary perfusion as well as left ventricular function.  Up-to-date suggested homogeneous discoloration can be a potentially malignant process. 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 I suggested that she see a dermatologist however.

## 2014-11-15 NOTE — Patient Instructions (Addendum)
Your physician recommends that you continue on your current medications as directed. Please refer to the Current Medication list given to you today.  Your physician recommends that you return for lab work in: 6 weeks for BMET  Your physician has requested that you have a [place] in [place]. For further information please visit [email or web address]. Please follow instruction sheet, as given.  Your physician recommends that you schedule a follow-up appointment in: 3 months with Dr. [name]

## 2014-11-21 ENCOUNTER — Encounter: Payer: Medicare Other | Admitting: Internal Medicine

## 2014-11-25 ENCOUNTER — Other Ambulatory Visit: Payer: Self-pay | Admitting: Internal Medicine

## 2014-11-25 ENCOUNTER — Encounter: Payer: Self-pay | Admitting: Internal Medicine

## 2014-11-25 DIAGNOSIS — R079 Chest pain, unspecified: Secondary | ICD-10-CM

## 2014-11-26 ENCOUNTER — Encounter (HOSPITAL_COMMUNITY)
Admission: RE | Admit: 2014-11-26 | Discharge: 2014-11-26 | Disposition: A | Discharge status: Home / Self Care | Payer: Medicare Other | Source: Ambulatory Visit | Attending: Internal Medicine | Admitting: Internal Medicine

## 2014-11-26 ENCOUNTER — Ambulatory Visit (HOSPITAL_COMMUNITY)
Admission: RE | Admit: 2014-11-26 | Discharge: 2014-11-26 | Disposition: A | Discharge status: Home / Self Care | Payer: Medicare Other | Source: Ambulatory Visit | Attending: Internal Medicine | Admitting: Internal Medicine

## 2014-11-26 ENCOUNTER — Encounter (HOSPITAL_COMMUNITY): Payer: Self-pay

## 2014-11-26 DIAGNOSIS — Z951 Presence of aortocoronary bypass graft: Secondary | ICD-10-CM | POA: Insufficient documentation

## 2014-11-26 DIAGNOSIS — R079 Chest pain, unspecified: Secondary | ICD-10-CM | POA: Insufficient documentation

## 2014-11-26 DIAGNOSIS — I428 Other cardiomyopathies: Secondary | ICD-10-CM | POA: Diagnosis not present

## 2014-11-26 DIAGNOSIS — R0600 Dyspnea, unspecified: Secondary | ICD-10-CM | POA: Insufficient documentation

## 2014-11-26 MED ORDER — REGADENOSON 0.4 MG/5ML IV SOLN
0.4000 mg | Freq: Once | INTRAVENOUS | Status: AC | PRN
  Administered 2014-11-26: 0.4 mg via INTRAVENOUS

## 2014-11-26 MED ORDER — REGADENOSON 0.4 MG/5ML IV SOLN
INTRAVENOUS | Status: AC
  Administered 2014-11-26: 0.4 mg via INTRAVENOUS
  Filled 2014-11-26: qty 5

## 2014-11-26 MED ORDER — SODIUM CHLORIDE 0.9 % IJ SOLN
10.0000 mL | INTRAMUSCULAR | Status: DC | PRN
  Administered 2014-11-26: 10 mL via INTRAVENOUS
  Filled 2014-11-26: qty 10

## 2014-11-26 MED ORDER — TECHNETIUM TC 99M SESTAMIBI - CARDIOLITE
30.0000 | Freq: Once | INTRAVENOUS | Status: AC | PRN
  Administered 2014-11-26: 30 via INTRAVENOUS

## 2014-11-26 MED ORDER — TECHNETIUM TC 99M SESTAMIBI GENERIC - CARDIOLITE
10.0000 | Freq: Once | INTRAVENOUS | Status: AC | PRN
  Administered 2014-11-26: 10 via INTRAVENOUS

## 2014-11-26 MED ORDER — SODIUM CHLORIDE 0.9 % IJ SOLN
INTRAMUSCULAR | Status: AC
  Administered 2014-11-26: 10 mL via INTRAVENOUS
  Filled 2014-11-26: qty 3

## 2014-11-26 NOTE — Progress Notes (Signed)
Stress Lab Nurses Notes - [name]  [name] 11/26/2014 Reason for doing test: Chest Pain and Dyspnea Type of test: [name] Nurse performing test: [name], RN Nuclear Medicine Tech: [name] Echo Tech: Not Applicable MD performing test: [name]/[name] NP Family MD: [name] Test explained and consent signed: Yes.   IV started: Saline lock flushed and No redness or edema Symptoms: Flushed Treatment/Intervention: None Reason test stopped: protocol completed After recovery IV was: Discontinued via X-ray tech and No redness or edema Patient to return to Nuc. Med at : 12:30 Patient discharged: Home Patient's Condition upon discharge was: stable Comments: During test BP 91/48 & HR 97.  Recovery BP 111/55 & HR 79.  Symptoms resolved in recovery. Interpreter helped with communication. [name] T

## 2014-12-04 ENCOUNTER — Telehealth: Payer: Self-pay | Admitting: Internal Medicine

## 2014-12-04 NOTE — Telephone Encounter (Signed)
Spoke with granddaughter (ok per patient) (granddaugher speaks perfect English). Advised that Dr. [name] will review test and I will call her.  She is aware it may be beginning of March before calling as Dr. [name] will be on a missionary trip next week out of the country. She is ok with this.

## 2014-12-04 NOTE — Telephone Encounter (Signed)
New Msg        Pt granddaughter calling to get lab results for pt, states pt doesn't speak English and was called yesterday about labs.   Please call pt granddaughter Sona at  [phone].

## 2014-12-25 ENCOUNTER — Telehealth: Payer: Self-pay | Admitting: Internal Medicine

## 2014-12-25 NOTE — Telephone Encounter (Signed)
lmtcb  (when call back, inform low risk study)

## 2014-12-25 NOTE — Telephone Encounter (Signed)
Informed patient's family member low risk nuclear study. Advised to call into office if patient's reported weakness/poor appetite worsen or become concerning for family. Granddaughter is aware and will inform patient.

## 2014-12-25 NOTE — Telephone Encounter (Signed)
Follow Up  Grandaughter returned call. Pt cannot speak english, Please call

## 2015-01-15 ENCOUNTER — Telehealth: Payer: Self-pay | Admitting: Gastroenterology

## 2015-01-15 NOTE — Telephone Encounter (Signed)
PATIENT GRANDAUGHTER CALLED STATING THAT PATIENT IS HAVING HEMORRHOID PAIN AGAIN.  WANTS TO SEE [facility] ONLY, MADE HER AN APPOINTMENT 01/06/15.  IS THERE SOMETHING THAT CAN BE CALLED INTO THE PHARMACY TO HELP HER WITH PAIN UNTIL THEN?  PLEASE ADVISE   CALL GRANDAUGHTER SONA AT [phone], SHE SPEAKS ENGLISH

## 2015-01-16 NOTE — Telephone Encounter (Signed)
I called and spoke to granddaughter, [name]. She said pt is having pain from the hemorrhoids and difficulty sitting. Buring when she has a BM. She has stopped eating very much, to avoid the pain in her rectum. She has an appt 02/06/2015. Can she get Rx sent to the pharmacy for cream or something to help until then. Please advise!

## 2015-01-17 MED ORDER — LIDOCAINE-HYDROCORTISONE ACE 3-2.5 % RE KIT: PACK | RECTAL | Status: DC

## 2015-01-17 NOTE — Addendum Note (Signed)
Addended by: Danie Binder on: 01/17/2015 11:06 AM   Modules accepted: Orders

## 2015-01-17 NOTE — Telephone Encounter (Signed)
PLEASE CALL PT'S FAMILY. i sent a prescription for rectal cream to [facility]. She should use it 4 times a day. She should call Dr. Barry Dienes for an appt in 3 weeks. I DO NOT HAVE ANYTHING ELSE TO OFFER HER EXCEPT RECTAL CREAMS FOR PAIN.

## 2015-01-17 NOTE — Telephone Encounter (Signed)
LMOM that Rx was sent in and to call.

## 2015-02-06 ENCOUNTER — Ambulatory Visit (INDEPENDENT_AMBULATORY_CARE_PROVIDER_SITE_OTHER): Payer: Medicare Other | Admitting: Gastroenterology

## 2015-02-06 DIAGNOSIS — K648 Other hemorrhoids: Secondary | ICD-10-CM

## 2015-02-06 MED ORDER — LIDOCAINE-HYDROCORTISONE ACE 3-2.5 % RE KIT: PACK | RECTAL | Status: DC

## 2015-02-06 NOTE — Assessment & Plan Note (Signed)
SYMPTOMS NOT CONTROLLED.  [place] BAND TODAY

## 2015-02-06 NOTE — Patient Instructions (Signed)
DRINK WATER TO KEEP YOUR URINE LIGHT YELLOW.  FOLLOW A HIGH FIBER DIET.   USE HEMORRHOID  CREAM FOUR TIMES DAILY TO RELIEVE HEMORRHOID PAIN. DO NOT USE MORE THAN 2 WEEKS AT A TIME.  FOLLOW UP IN 3 WEEKS.

## 2015-02-06 NOTE — Progress Notes (Signed)
Patient ID: [name], female   DOB: Jul 23, 1941, 74 y.o.   MRN: [id number]  Past Medical History  Diagnosis Date  . PSVT (paroxysmal supraventricular tachycardia)   . Ischemic cardiomyopathy     Anterior MI 2005 with primary PCI: cath 2013Cardiac catheterization showed a heavily calcified left main with a long 95% stenosis. The LAD had 80% proximal stenosis. The right coronary was a large dominant vessel with proximal 70% stenosis and 50% distal stenosis before the bifurcation. Left ventricular ejection fraction was 35% with akinesis of the anterior apical wall  . Chronic systolic heart failure   . 2505 lead 09/06/2011  . Coronary artery disease   . History of blood transfusion     "after heart surgery" (03/21/2013)  . GERD (gastroesophageal reflux disease)   . Myocardial infarction 02/18/2004    Archie Endo 02/18/2004 (03/20/2013)  . Automatic implantable cardioverter-defibrillator in situ 06/24/2004    Archie Endo 06/24/2004 (03/21/2013)

## 2015-02-06 NOTE — Progress Notes (Signed)
SYMPTOMS: has rectal PRESSURE, PAIN, itching. No RECTAL BLEEDING OR ABD PAIN. BMs; GOOD. WANTS MORE HEMORHROID CREAM.  CONSTIPATION: NO DIARRHEA:NO   TISSUE POKES OUT OF RECTUM: NO  ADDITIONAL QUESTIONS:  LATEX ALLERGY: NO PREGNANT: NO ERECTILE DYSFUNCTION MEDS OR NITRATES: NO ANTICOAGULATION/ANTIPLATELET MEDS: NO DIAGNOSED WITH CROHN'S DISEASE, PROCTITIS, PORTAL HTN, OR ANAL/RECTAL CA: NO TAKING IMMUNOSUPPRESSANTS/XRT: NO  PLAN: 1. ANOSCOPY/[facility] BANDING NOW.   PROCEDURE TECHNIQUE: BENEFITS RISK EXPLAINED TO PT. ANOSCOPY PERFORMED. BULGING INTERNAL HEMORRHOID COLUMN IN THE R POSTERIOR COLUMNS. EXTERNAL HEMORRHOID APPRECIATED. ONE [facility] BAND PLACED IN RIGHT POSTERIOR POSITION. POST-BANDING RECTAL EXAM REVEALED GOOD PLACEMENT. EXAM NON-TENDER.

## 2015-02-07 ENCOUNTER — Telehealth: Payer: Self-pay | Admitting: Gastroenterology

## 2015-02-07 MED ORDER — HYDROCORTISONE 2.5 % RE CREA: 1.0000 "application " | TOPICAL_CREAM | Freq: Two times a day (BID) | RECTAL | Status: DC

## 2015-02-07 NOTE — Telephone Encounter (Signed)
Pt's granddaughter Davy Pique) called to say that patient was seen by [facility] yesterday and the prescription isn't covered by her insurance. She is recommending something else or do we need to do a PA. She doesn't know name of medicine just that SF gave it to her yesterday. Please advise and call granddaughter since patient does not speak [place]. [phone]

## 2015-02-07 NOTE — Telephone Encounter (Addendum)
She was given hydrocortisone with lidocaine after hemorrhoid banding yesterday. There really is no alternative for that combination hemorrhoid cream.  I can try the hydrocortisone only cream but there will not be any lidocaine in it for pain. Just something for swelling.

## 2015-02-07 NOTE — Telephone Encounter (Signed)
The rx was for a hemorrhoid cream. Can we send in something else?

## 2015-02-07 NOTE — Addendum Note (Signed)
Addended by: Mahala Menghini on: 02/07/2015 11:45 AM   Modules accepted: Orders

## 2015-02-11 NOTE — Telephone Encounter (Signed)
I spoke to the Granddaughter, Davy Pique, and she is aware of the new RX. She wanted to know if the first prescription could have a PA and I told her to contact [place] and discuss with them and if that can be done for them to fax the info here and julie can work on that.

## 2015-02-24 ENCOUNTER — Encounter: Payer: Medicare Other | Admitting: Internal Medicine

## 2015-02-25 ENCOUNTER — Telehealth: Payer: Self-pay | Admitting: Internal Medicine

## 2015-02-25 NOTE — Telephone Encounter (Signed)
New Message    Pt c/o swelling: STAT is pt has developed SOB within 24 hours  1. How long have you been experiencing swelling?past 2 weeks  2. Where is the swelling located? In her feet and legs and a lot of water composure in her face   3.  Are you currently taking a "fluid pill"?yes   4.  Are you currently SOB? Yes   5.  Have you traveled recently?no  Patient also black out and was out about 10 mins.   Would like a sooner appt with Dr. Klein/PA/NP if available.

## 2015-02-27 NOTE — Telephone Encounter (Signed)
She describes pt symptoms as similar to when she experience acute HF exacerbation not too long ago. Reports: pt has gained 5 or more pds in last week, SOB, BLEE, and SOB. Patient scheduled for follow up with PA tomorrow at 10 am to address. She is agreeable and states someone will bring patient.

## 2015-02-28 ENCOUNTER — Ambulatory Visit: Payer: Medicare Other | Admitting: Nurse Practitioner

## 2015-03-03 ENCOUNTER — Encounter: Payer: Self-pay | Admitting: Physician Assistant

## 2015-03-03 ENCOUNTER — Ambulatory Visit (INDEPENDENT_AMBULATORY_CARE_PROVIDER_SITE_OTHER): Payer: Medicare Other | Admitting: Physician Assistant

## 2015-03-03 VITALS — BP 112/64 | HR 65 | Ht 61.0 in | Wt 120.8 lb

## 2015-03-03 DIAGNOSIS — Z951 Presence of aortocoronary bypass graft: Secondary | ICD-10-CM

## 2015-03-03 DIAGNOSIS — I5022 Chronic systolic (congestive) heart failure: Secondary | ICD-10-CM

## 2015-03-03 DIAGNOSIS — I255 Ischemic cardiomyopathy: Secondary | ICD-10-CM | POA: Diagnosis not present

## 2015-03-03 DIAGNOSIS — R0602 Shortness of breath: Secondary | ICD-10-CM

## 2015-03-03 LAB — BASIC METABOLIC PANEL
BUN: 14 mg/dL (ref 6–23)
CALCIUM: 9.6 mg/dL (ref 8.4–10.5)
CHLORIDE: 104 meq/L (ref 96–112)
CO2: 25 meq/L (ref 19–32)
CREATININE: 0.9 mg/dL (ref 0.40–1.20)
GFR: 65.38 mL/min (ref >60.00)
GLUCOSE: 87 mg/dL (ref 70–99)
Potassium: 4 mEq/L (ref 3.5–5.1)
Sodium: 135 mEq/L (ref 135–145)

## 2015-03-03 LAB — BRAIN NATRIURETIC PEPTIDE: Pro B Natriuretic peptide (BNP): 467 pg/mL — ABNORMAL HIGH (ref 0.0–100.0)

## 2015-03-03 NOTE — Assessment & Plan Note (Signed)
Patient has symptoms of cough with few crackles in her lungs but no edema or weight gain. I suspect she has a very small amount of fluid on board. I will increase her Lasix to 60 mg daily for 2 days increase her potassium to 40 mEq daily for 2 days then back to her regular dose of both. We will check a BNP and BMP today. Follow-up with Dr. [name] in July. Call if her symptoms are not resolved.

## 2015-03-03 NOTE — Assessment & Plan Note (Signed)
Stable without symptoms of chest pain

## 2015-03-03 NOTE — Assessment & Plan Note (Signed)
Patient's EF is 23%. She has symptoms of heart failure today. Increase diuretics temporarily. 2 g sodium diet.

## 2015-03-03 NOTE — Patient Instructions (Signed)
Medication Instructions:  1) TAKE Lasix 60mg  once daily x 2 days and then return to taking Lasix 40mg  once daily. 2) TAKE Potassium 81mEq once daily x 2 days and then return to taking Potassium 39mEq once daily.  Labwork: BMET, BNP today  Testing/Procedures: None  Follow-Up: Follow up with Dr. [name] as previously ordered on May 16, 2015 at 12PM  Any Other Special Instructions Will Be Listed Below (If Applicable).

## 2015-03-03 NOTE — Progress Notes (Signed)
Cardiology Office Note   Date:  03/03/2015   ID:  VERBA AINLEY, DOB [birth year], MRN [id number]  PCP:  [name], MD  Cardiologist:  Dr. Jolyn Nap  Chief Complaint: Short of breath, 5 pound weight gain    History of Present Illness: YURIKA PEREDA is a 74 y.o. female who presents for follow-up of heart failure. She has history of congestive heart failure in the setting of ischemic cardiomyopathy and previously implanted ICD. She had an abnormal Myoview scan and cardiac catheterization demonstrated 70% proximal RCA, 80% proximal LAD, 95% left main. EF was 35% and she underwent CABG in 2014. She was in the emergency room in January 2016 following a trip to [place] and had trouble with heart failure and chest pain. She was treated with IV Lasix. She had a Lexi scan Myoview 11/2014 showed a large region of scar affecting the mid to apical anterior and anteroseptal walls associated with mild peri-infarct ischemia in the mid anterior wall. This was consistent with her reported ischemic cardiomyopathy. There was also a large area of akinesis in the apical and mid to distal anterior wall and anteroseptal wall. EF was 23%. This was a high-risk finding based on reduced LV function. There is only mild peri-infarct ischemia with large region of scar as outlined.   Patient's granddaughter called in May 10 complaining of leg swelling and weight gain. This improved on its own. The patient now complains of several week history of coughing when she lays down. She also feels like she coughs when she eats. She is on Protonix for reflux. Her swelling is down and her weight is down as well. She denies any significant dyspnea or chest pain. She does have some dyspnea on exertion. Her main complaint is cough when she lays down. She does get some salt in her diet.      Past Medical History  Diagnosis Date  . PSVT (paroxysmal supraventricular tachycardia)   . Ischemic cardiomyopathy     Anterior MI 2005 with  primary PCI: cath 2013Cardiac catheterization showed a heavily calcified left main with a long 95% stenosis. The LAD had 80% proximal stenosis. The right coronary was a large dominant vessel with proximal 70% stenosis and 50% distal stenosis before the bifurcation. Left ventricular ejection fraction was 35% with akinesis of the anterior apical wall  . Chronic systolic heart failure   . 3151 lead 09/06/2011  . Coronary artery disease   . History of blood transfusion     "after heart surgery" (03/21/2013)  . GERD (gastroesophageal reflux disease)   . Myocardial infarction 02/18/2004    Archie Endo 02/18/2004 (03/20/2013)  . Automatic implantable cardioverter-defibrillator in situ 06/24/2004    Archie Endo 06/24/2004 (03/21/2013)    Past Surgical History  Procedure Laterality Date  . Cardiac defibrillator placement      Medtronic  . Intraoperative transesophageal echocardiogram  11/03/2012    Procedure: INTRAOPERATIVE TRANSESOPHAGEAL ECHOCARDIOGRAM;  Surgeon: Gaye Pollack, MD;  Location: [facility] OR;  Service: Open Heart Surgery;  Laterality: N/A;  . Coronary artery bypass graft  11/03/2012    Procedure: CORONARY ARTERY BYPASS GRAFTING (CABG);  Surgeon: Gaye Pollack, MD;  Location: [place];  Service: Open Heart Surgery;  Laterality: N/A;  . Cataract extraction w/ intraocular lens  implant, bilateral Bilateral ~2008  . Knee arthroscopy Left 02/19/2010    Archie Endo 02/19/2010 (03/21/2013)  . Coronary angioplasty  02/18/2004    Archie Endo 02/18/2004 (03/21/2013)  . Colonoscopy N/A 02/26/2014    Procedure: COLONOSCOPY;  Surgeon:  Danie Binder, MD;  [facility];  Service: Endoscopy;  Laterality: N/A;  1:00  . Left and right heart catheterization with coronary angiogram N/A 10/30/2012    Procedure: LEFT AND RIGHT HEART CATHETERIZATION WITH CORONARY ANGIOGRAM;  Surgeon: Sherren Mocha, MD;  Location: [facility] CATH LAB;  Service: Cardiovascular;  Laterality: N/A;  . Lead revision N/A 03/22/2013    Procedure: LEAD REVISION;  Surgeon: Evans Lance, MD;  Location: [facility] CATH LAB;  Service: Cardiovascular;  Laterality: N/A;     Current Outpatient Prescriptions  Medication Sig Dispense Refill  . aspirin EC 81 MG tablet Take 1 tablet (81 mg total) by mouth daily.    . benzonatate (TESSALON) 100 MG capsule Take 1 capsule (100 mg total) by mouth 3 (three) times daily as needed for cough. 20 capsule 0  . carvedilol (COREG) 6.25 MG tablet Take 1/2 tablet in the morning. Take 1 tablet in the evening. 45 tablet 5  . clopidogrel (PLAVIX) 75 MG tablet Take 1 tablet (75 mg total) by mouth daily. 30 tablet 11  . diclofenac sodium (VOLTAREN) 1 % GEL Apply 1 application topically daily as needed (for pain). As needed    . furosemide (LASIX) 20 MG tablet Take 2 tablets (40 mg total) by mouth daily. 60 tablet 6  . lisinopril (PRINIVIL,ZESTRIL) 2.5 MG tablet Take 1 tablet (2.5 mg total) by mouth at bedtime. 30 tablet 6  . metFORMIN (GLUCOPHAGE) 500 MG tablet Take 500 mg by mouth daily.    . Multiple Vitamin (MULTIVITAMIN WITH MINERALS) TABS tablet Take 1 tablet by mouth daily.    [name] Kitchen NITROSTAT 0.4 MG SL tablet PLACE ONE TABLET UNDER THE TONGUE EVERY FIVE MINUTES AS NEEDED FOR CHEST PAIN 25 tablet 5  . pantoprazole (PROTONIX) 40 MG tablet Take 1 tablet (40 mg total) by mouth daily. 30 tablet 11  . Potassium Chloride ER 20 MEQ TBCR Take 20 mEq by mouth daily. 30 tablet 5  . simvastatin (ZOCOR) 20 MG tablet TAKE ONE TABLET TWO TIMES PER WEEK 8 tablet 11   No current facility-administered medications for this visit.    Allergies:   Review of patient's allergies indicates no known allergies.    Social History:  The patient  reports that she has quit smoking. She has never used smokeless tobacco. She reports that she does not drink alcohol or use illicit drugs.   Family History:  The patient's    family history includes Heart attack in her brother, mother, and sister; Hypertension in her father. There is no history of Colon cancer.    ROS:  Please see  the history of present illness.   Otherwise, review of systems are positive for none.   All other systems are reviewed and negative.    PHYSICAL EXAM: VS:  BP 112/64 mmHg  Pulse 65  Ht 5\' 1"  (1.549 m)  Wt 120 lb 12.8 oz (54.795 kg)  BMI 22.84 kg/m2 , BMI Body mass index is 22.84 kg/(m^2). GEN: Well nourished, well developed, in no acute distress Neck: no JVD, HJR, carotid bruits, or masses Cardiac: RRR; no murmurs,gallop, rubs, thrill or heave,  Respiratory:  Decreased breath sounds with fine crackles at the left base  GI: soft, nontender, nondistended, + BS MS: no deformity or atrophy Extremities: without cyanosis, clubbing, edema, good distal pulses bilaterally.  Skin: warm and dry, no rash Neuro:  Strength and sensation are intact    EKG:  EKG is ordered today. The ekg ordered today demonstrates normal  sinus rhythm with nonspecific ST-T wave changes  Recent Labs: 10/25/2014: BUN 11; Creatinine 0.90; Hemoglobin 9.4*; Platelets 258; Potassium 3.5; Sodium 142    Lipid Panel    Component Value Date/Time   CHOL 198 01/03/2013 1115   TRIG 167.0* 01/03/2013 1115   HDL 46.30 01/03/2013 1115   CHOLHDL 4 01/03/2013 1115   VLDL 33.4 01/03/2013 1115   LDLCALC 118* 01/03/2013 1115   LDLDIRECT 122.2 07/31/2007 1307      Wt Readings from Last 3 Encounters:  03/03/15 120 lb 12.8 oz (54.795 kg)  11/15/14 123 lb (55.792 kg)  10/25/14 125 lb (56.7 kg)      Other studies Reviewed: Additional studies/ records that were reviewed today include and review of the records demonstrates:  Stress Myoview 2/2016IMPRESSION: 1. Large region of scar affecting the mid to apical anterior and anteroseptal walls associated with mild peri-infarct ischemia in the mid anterior wall. This is consistent with reported history of ischemic cardiomyopathy.   2. Large area of akinesis in the apical and mid to distal anterior wall and anteroseptal wall. Left ventricular dilatation also consistent with  cardiomyopathy.   3. Left ventricular ejection fraction 23%   4. High-risk stress test findings* based mainly on reduction in LVEF. There is only mild peri-infarct ischemia, with large region of scar as outlined.      ASSESSMENT AND PLAN: Chronic systolic heart failure Patient has symptoms of cough with few crackles in her lungs but no edema or weight gain. I suspect she has a very small amount of fluid on board. I will increase her Lasix to 60 mg daily for 2 days increase her potassium to 40 mEq daily for 2 days then back to her regular dose of both. We will check a BNP and BMP today. Follow-up with Dr. [name] in July. Call if her symptoms are not resolved.   CARDIOMYOPATHY, ISCHEMIC Patient's EF is 23%. She has symptoms of heart failure today. Increase diuretics temporarily. 2 g sodium diet.   S/P CABG x 4 Stable without symptoms of chest pain      Signed, Ermalinda Barrios, PA-C  03/03/2015 1:50 PM    [facility] [address], [place], [place]  [address] Phone: [phone]; Fax: [phone]

## 2015-03-05 ENCOUNTER — Encounter: Payer: Medicare Other | Admitting: Gastroenterology

## 2015-03-05 NOTE — Progress Notes (Signed)
   Subjective:    Patient ID: [name], female    DOB: 1941/03/06, 74 y.o.   MRN: [id number]  HPI   Past Medical History  Diagnosis Date  . PSVT (paroxysmal supraventricular tachycardia)   . Ischemic cardiomyopathy     Anterior MI 2005 with primary PCI: cath 2013Cardiac catheterization showed a heavily calcified left main with a long 95% stenosis. The LAD had 80% proximal stenosis. The right coronary was a large dominant vessel with proximal 70% stenosis and 50% distal stenosis before the bifurcation. Left ventricular ejection fraction was 35% with akinesis of the anterior apical wall  . Chronic systolic heart failure   . 0677 lead 09/06/2011  . Coronary artery disease   . History of blood transfusion     "after heart surgery" (03/21/2013)  . GERD (gastroesophageal reflux disease)   . Myocardial infarction 02/18/2004    Archie Endo 02/18/2004 (03/20/2013)  . Automatic implantable cardioverter-defibrillator in situ 06/24/2004    Archie Endo 06/24/2004 (03/21/2013)    Review of Systems     Objective:   Physical Exam        Assessment & Plan:

## 2015-03-06 ENCOUNTER — Ambulatory Visit (INDEPENDENT_AMBULATORY_CARE_PROVIDER_SITE_OTHER): Payer: Medicare Other | Admitting: Gastroenterology

## 2015-03-06 ENCOUNTER — Encounter: Payer: Self-pay | Admitting: Gastroenterology

## 2015-03-06 VITALS — BP 89/61 | HR 78 | Temp 97.4°F | Ht 60.0 in | Wt 120.0 lb

## 2015-03-06 DIAGNOSIS — K648 Other hemorrhoids: Secondary | ICD-10-CM | POA: Diagnosis not present

## 2015-03-06 DIAGNOSIS — I255 Ischemic cardiomyopathy: Secondary | ICD-10-CM | POA: Diagnosis not present

## 2015-03-06 DIAGNOSIS — K644 Residual hemorrhoidal skin tags: Secondary | ICD-10-CM | POA: Insufficient documentation

## 2015-03-06 MED ORDER — LIDOCAINE 5 % EX OINT: TOPICAL_OINTMENT | CUTANEOUS | Status: DC

## 2015-03-06 MED ORDER — HYDROCORTISONE 2.5 % RE CREA: TOPICAL_CREAM | Freq: Two times a day (BID) | RECTAL | Status: DC

## 2015-03-06 NOTE — Patient Instructions (Addendum)
MANAGEMENT OF HEMORRHOIDS ARE : 1. MEDICAL (CREAM/OINTMENTS) OR 2. SURGICAL. SHE WOULD BE HIGH RISK FOR SURGERY DUE TOP AGE AND CO-MORBIDITIES.  ADD LIDOCAINE 5% TOPICALLY  BEFORE AND AFTER BMs.  USE ANUSOL CREAM 1-2 TIMES A DAY BEFORE AND AFTER BMs TO HELP WITH RECTAL PAIN. DO NOT USE MORE THAN 7 DAYS IN A ROW.  DRINK WATER TO KEEP YOUR URINE LIGHT YELLOW.  EAT FIBER.  FOLLOW UP IN 4 MOS.

## 2015-03-06 NOTE — Progress Notes (Signed)
cc'ed to pcp °

## 2015-03-06 NOTE — Progress Notes (Signed)
ON RECALL LIST  °

## 2015-03-06 NOTE — Progress Notes (Signed)
Subjective:    Patient ID: [name], female    DOB: November 13, 1940, 74 y.o.   MRN: [id number]  [name], MD  HPI DOING ALL RIGHT. NO CREAM FOR PAST 2-3 NOTES. THE ONE AT [facility] WASN'T COVERED. WANTS AN ALTERNATIVE. NOTHING DROPPING DOWN AND POPPING BACK UP. A LITTLE BURNING AFTER BMS: MORE OFTEN THAN NOT. SHE'S AFRAID TO EAT BECAUSE WHEN SHE HAS A BM IT BURNS. BMs: 1-2. IF PUTS MEDICINE, NO PROBLEM.   PT DENIES FEVER, CHILLS, HEMATOCHEZIA, nausea, vomiting, melena, diarrhea,constipation, abdominal pain, problems swallowing, OR heartburn or indigestion.  Past Medical History  Diagnosis Date  . PSVT (paroxysmal supraventricular tachycardia)   . Ischemic cardiomyopathy     Anterior MI 2005 with primary PCI: cath 2013Cardiac catheterization showed a heavily calcified left main with a long 95% stenosis. The LAD had 80% proximal stenosis. The right coronary was a large dominant vessel with proximal 70% stenosis and 50% distal stenosis before the bifurcation. Left ventricular ejection fraction was 35% with akinesis of the anterior apical wall  . Chronic systolic heart failure   . 3016 lead 09/06/2011  . Coronary artery disease   . History of blood transfusion     "after heart surgery" (03/21/2013)  . GERD (gastroesophageal reflux disease)   . Myocardial infarction 02/18/2004    Archie Endo 02/18/2004 (03/20/2013)  . Automatic implantable cardioverter-defibrillator in situ 06/24/2004    Archie Endo 06/24/2004 (03/21/2013)    Past Surgical History  Procedure Laterality Date  . Cardiac defibrillator placement      Medtronic  . Intraoperative transesophageal echocardiogram  11/03/2012    Procedure: INTRAOPERATIVE TRANSESOPHAGEAL ECHOCARDIOGRAM;  Surgeon: Gaye Pollack, MD;  Location: [facility] OR;  Service: Open Heart Surgery;  Laterality: N/A;  . Coronary artery bypass graft  11/03/2012    Procedure: CORONARY ARTERY BYPASS GRAFTING (CABG);  Surgeon: Gaye Pollack, MD;  Location: [place];  Service:  Open Heart Surgery;  Laterality: N/A;  . Cataract extraction w/ intraocular lens  implant, bilateral Bilateral ~2008  . Knee arthroscopy Left 02/19/2010    Archie Endo 02/19/2010 (03/21/2013)  . Coronary angioplasty  02/18/2004    Archie Endo 02/18/2004 (03/21/2013)  . Colonoscopy N/A 02/26/2014    SLF: 1. Normal mucosa in teh termianl ileum 2. Two colon polyps  removed 3. The left colon in redundant 4. Rectal bleeding due to Moderate sized internal hemorroids   . Left and right heart catheterization with coronary angiogram N/A 10/30/2012    Procedure: LEFT AND RIGHT HEART CATHETERIZATION WITH CORONARY ANGIOGRAM;  Surgeon: Sherren Mocha, MD;  Location: [facility] CATH LAB;  Service: Cardiovascular;  Laterality: N/A;  . Lead revision N/A 03/22/2013    Procedure: LEAD REVISION;  Surgeon: Evans Lance, MD;  Location: [facility] CATH LAB;  Service: Cardiovascular;  Laterality: N/A;   No Known Allergies  Current Outpatient Prescriptions  Medication Sig Dispense Refill  . aspirin EC 81 MG tablet Take 1 tablet (81 mg total) by mouth daily.    . benzonatate (TESSALON) 100 MG capsule Take 1 capsule (100 mg total) by mouth 3 (three) times daily as needed for cough.    . carvedilol (COREG) 6.25 MG tablet Take 1/2 tablet in the morning. Take 1 tablet in the evening.    . clopidogrel (PLAVIX) 75 MG tablet Take 1 tablet (75 mg total) by mouth daily.    . diclofenac sodium (VOLTAREN) 1 % GEL Apply 1 application topically daily as needed (for pain). As needed    . furosemide (LASIX) 20 MG  tablet Take 2 tablets (40 mg total) by mouth daily.    [name] Kitchen lisinopril (PRINIVIL,ZESTRIL) 2.5 MG tablet Take 1 tablet (2.5 mg total) by mouth at bedtime.    . metFORMIN (GLUCOPHAGE) 500 MG tablet Take 500 mg by mouth daily.    . Multiple Vitamin (MULTIVITAMIN WITH MINERALS) TABS tablet Take 1 tablet by mouth daily.    [name] Kitchen NITROSTAT 0.4 MG SL tablet PLACE ONE TABLET UNDER THE TONGUE EVERY FIVE MINUTES AS NEEDED FOR CHEST PAIN    . pantoprazole (PROTONIX) 40 MG  tablet Take 1 tablet (40 mg total) by mouth daily.    . Potassium Chloride ER 20 MEQ TBCR Take 20 mEq by mouth daily.    . simvastatin (ZOCOR) 20 MG tablet TAKE ONE TABLET 2X/WK     Review of Systems     Objective:   Physical Exam  Constitutional: She is oriented to person, place, and time. She appears well-developed and well-nourished. No distress.  HENT:  Head: Normocephalic and atraumatic.  Mouth/Throat: Oropharynx is clear and moist. No oropharyngeal exudate.  Eyes: Pupils are equal, round, and reactive to light. No scleral icterus.  Neck: Normal range of motion. Neck supple.  Cardiovascular: Normal rate, regular rhythm and normal heart sounds.   Pulmonary/Chest: Effort normal and breath sounds normal. No respiratory distress.  Abdominal: Soft. Bowel sounds are normal. She exhibits no distension. There is no tenderness.  Genitourinary: Rectal exam shows external hemorrhoid. Rectal exam shows no fissure and no tenderness.  5% LIDOCAINE APPLIED TO EXTERNAL ANUS/PERINEAL AREA. NO ERYTHEMA OF THE PERINEUM.  Musculoskeletal: She exhibits no edema.  Lymphadenopathy:    She has no cervical adenopathy.  Neurological: She is alert and oriented to person, place, and time.  Psychiatric: She has a normal mood and affect.  Vitals reviewed.         Assessment & Plan:

## 2015-03-06 NOTE — Assessment & Plan Note (Addendum)
BURNING AND DISCOMFORT UNCONTROLLED WITHOUT TOPICAL MEDS.  PT WOULD BE HIGH RISK FOR SURGERY DUE TOP AGE AND CO-MORBIDITIES. ADD LIDOCAINE BEFORE AN DAFTER BMs. USE ANUSOL HC 1-2X/DAY BEFORE AND AFTER BMs TO HELP WITH RECTAL PAIN. DO NOT USE MORE THAN 7 DAYS IN A ROW. DRINK WATER TO KEEP YOUR URINE LIGHT YELLOW. EAT FIBER FOLLOW UP IN 4 MOS.

## 2015-05-11 ENCOUNTER — Other Ambulatory Visit: Payer: Self-pay | Admitting: Internal Medicine

## 2015-05-11 ENCOUNTER — Other Ambulatory Visit: Payer: Self-pay | Admitting: Gastroenterology

## 2015-05-12 ENCOUNTER — Other Ambulatory Visit: Payer: Self-pay

## 2015-05-12 DIAGNOSIS — I255 Ischemic cardiomyopathy: Secondary | ICD-10-CM

## 2015-05-12 DIAGNOSIS — R079 Chest pain, unspecified: Secondary | ICD-10-CM

## 2015-05-12 DIAGNOSIS — I5022 Chronic systolic (congestive) heart failure: Secondary | ICD-10-CM

## 2015-05-12 DIAGNOSIS — Z4502 Encounter for adjustment and management of automatic implantable cardiac defibrillator: Secondary | ICD-10-CM

## 2015-05-12 MED ORDER — POTASSIUM CHLORIDE ER 20 MEQ PO TBCR: 20.0000 meq | EXTENDED_RELEASE_TABLET | Freq: Every day | ORAL | Status: DC

## 2015-05-16 ENCOUNTER — Encounter: Payer: Medicare Other | Admitting: Internal Medicine

## 2015-05-19 ENCOUNTER — Encounter: Payer: Self-pay | Admitting: Gastroenterology

## 2015-06-02 ENCOUNTER — Telehealth: Payer: Self-pay | Admitting: Internal Medicine

## 2015-06-02 NOTE — Telephone Encounter (Signed)
New MESSAGE   Pt is coughing and sometimes her feet swell and her    granddaughter is in town and she wants to be with pt at the doctors visit  Pt is not having any issues currently she wants a sooner appt  She wants RN to give her a call because pt is still taking the medication but is still not getting any better

## 2015-06-03 NOTE — Telephone Encounter (Signed)
Ok to speak w/ granddaughter per pt. She reports a lingering cough (been around for months), tired/fatigued, slight fluid retention, bilateral calf pain, and occasional orthopnea.   Denies LEE. They cannot tell me if patient has gained any weight as they do not have scales at home.  We discussed this and I encouraged them to purchase this item.  She is aware I will look into arranging OV with extender and call them back.

## 2015-06-03 NOTE — Telephone Encounter (Signed)
Appt made to see [name], [facility] on Thursday, while [name] is in office also. Granddaughter thanks [place] tremendously for helping with this.

## 2015-06-04 NOTE — Progress Notes (Addendum)
Cardiology Office Note Date:  06/05/2015  Patient ID:  [name], DOB 1941/09/21, MRN [id number] PCP:  [name], MD  Cardiologist: Dr. [name]   Chief Complaint: cough, swelling  History of Present Illness: [name] is a 74 y.o. female with history of CAD (anterior MI 2005, 4V CABG 3532), chronic systolic CHF/ICM s/p ICD, dyslipidemia, anemia who presents evaluation of for cough and edema.   In June 2014 she had a 6949 lead fractured requiring replacement. At that time she received a new atrial lead 2/2 bradycardia and a new generator.She had a Lexi scan Myoview 11/2014 showed a large region of scar affecting the mid to apical anterior and anteroseptal walls associated with mild peri-infarct ischemia in the mid anterior wall. This was consistent with her reported ischemic cardiomyopathy. There was also a large area of akinesis in the apical and mid to distal anterior wall and anteroseptal wall. EF was 23%. This was a high-risk finding based on reduced LV function. There is only mild peri-infarct ischemia with large region of scar as outlined. Last echo available 02/2004 with EF 30-35%, mild MR, mild RV hypertrophy. Last BMET WNL 02/2015 with CBC in 10/2014 c/w anemia Hgb 9.4 (microcytic), previously 10-11 in 2014.  She comes to clinic today with a translator and her granddaughter. She has been having a "congested" type cough for 3 weeks. It is worse when she lies back. She has been feeling weak and tired with minimal activity. However, she denies any dyspnea either at rest or with exertion. No chest pain. No LEE or weight gain. Appetite is not good but this is not new. No hemoptysis reported. No fevers but she does report feeling cold. Pulse ox 99%.   Past Medical History  Diagnosis Date  . PSVT (paroxysmal supraventricular tachycardia)   . Ischemic cardiomyopathy     a. Anterior MI 2005 with primary PCI. b. EF 30-35%, s/p prior ICD. June 2014 she had a 6949 lead fractured  requiring replacement. At that time she received a new atrial lead 2/2 bradycardia and a new generator.  . Chronic systolic heart failure   . 9924 lead 09/06/2011  . Coronary artery disease     a. anterior MI 2005. b. 4V CABG 2014.  [name] Kitchen History of blood transfusion     "after heart surgery" (03/21/2013)  . GERD (gastroesophageal reflux disease)   . Myocardial infarction 02/18/2004    Archie Endo 02/18/2004 (03/20/2013)  . Automatic implantable cardioverter-defibrillator in situ 06/24/2004    Archie Endo 06/24/2004 (03/21/2013)  . Anemia   . Dyslipidemia     Past Surgical History  Procedure Laterality Date  . Cardiac defibrillator placement      Medtronic  . Intraoperative transesophageal echocardiogram  11/03/2012    Procedure: INTRAOPERATIVE TRANSESOPHAGEAL ECHOCARDIOGRAM;  Surgeon: Gaye Pollack, MD;  Location: [facility] OR;  Service: Open Heart Surgery;  Laterality: N/A;  . Coronary artery bypass graft  11/03/2012    Procedure: CORONARY ARTERY BYPASS GRAFTING (CABG);  Surgeon: Gaye Pollack, MD;  Location: [place];  Service: Open Heart Surgery;  Laterality: N/A;  . Cataract extraction w/ intraocular lens  implant, bilateral Bilateral ~2008  . Knee arthroscopy Left 02/19/2010    Archie Endo 02/19/2010 (03/21/2013)  . Coronary angioplasty  02/18/2004    Archie Endo 02/18/2004 (03/21/2013)  . Colonoscopy N/A 02/26/2014    SLF: 1. Normal mucosa in teh termianl ileum 2. Two colon polyps  removed 3. The left colon in redundant 4. Rectal bleeding due to Moderate sized internal  hemorroids   . Left and right heart catheterization with coronary angiogram N/A 10/30/2012    Procedure: LEFT AND RIGHT HEART CATHETERIZATION WITH CORONARY ANGIOGRAM;  Surgeon: Sherren Mocha, MD;  Location: [facility] CATH LAB;  Service: Cardiovascular;  Laterality: N/A;  . Lead revision N/A 03/22/2013    Procedure: LEAD REVISION;  Surgeon: Evans Lance, MD;  Location: [facility] CATH LAB;  Service: Cardiovascular;  Laterality: N/A;    Current Outpatient Prescriptions  Medication  Sig Dispense Refill  . benzonatate (TESSALON) 100 MG capsule Take 1 capsule (100 mg total) by mouth 3 (three) times daily as needed for cough. 20 capsule 0  . carvedilol (COREG) 6.25 MG tablet Take 1/2 tablet in the morning. Take 1 tablet in the evening. 45 tablet 5  . clopidogrel (PLAVIX) 75 MG tablet Take 1 tablet (75 mg total) by mouth daily. 30 tablet 11  . diclofenac sodium (VOLTAREN) 1 % GEL Apply 1 application topically daily as needed (for pain). As needed    . esomeprazole (NEXIUM) 40 MG capsule Take 40 mg by mouth daily at 12 noon.    . furosemide (LASIX) 20 MG tablet Take 2 tablets (40 mg total) by mouth daily. 60 tablet 6  . hydrocortisone 2.5 % cream APPLY TO RECTUM TWICE DAILY 28.35 g 0  . lidocaine (XYLOCAINE) 5 % ointment APPLY TO RECTUM FOUR TIMES DAILY FOR 7 DAYS TO HELP RECTAL PAIN 35.44 g 0  . lisinopril (PRINIVIL,ZESTRIL) 2.5 MG tablet Take 1 tablet (2.5 mg total) by mouth at bedtime. 30 tablet 6  . Multiple Vitamin (MULTIVITAMIN WITH MINERALS) TABS tablet Take 1 tablet by mouth daily.    [name] Kitchen NITROSTAT 0.4 MG SL tablet PLACE ONE TABLET UNDER THE TONGUE EVERY FIVE MINUTES AS NEEDED FOR CHEST PAIN 25 tablet 5  . Potassium Chloride ER 20 MEQ TBCR Take 20 mEq by mouth daily. 30 tablet 6  . simvastatin (ZOCOR) 20 MG tablet TAKE ONE TABLET TWO TIMES PER WEEK 8 tablet 11  . aspirin EC 81 MG tablet Take 1 tablet (81 mg total) by mouth daily.    . metFORMIN (GLUCOPHAGE) 500 MG tablet Take 500 mg by mouth daily.     No current facility-administered medications for this visit.    Allergies:   Review of patient's allergies indicates no known allergies.   Social History:  The patient  reports that she has quit smoking. She has never used smokeless tobacco. She reports that she does not drink alcohol or use illicit drugs.   Family History:  The patient's family history includes Heart attack in her brother, mother, and sister; Hypertension in her father. There is no history of Colon  cancer.  ROS:  Please see the history of present illness.  All other systems are reviewed and otherwise negative.   PHYSICAL EXAM:  VS:  BP 128/58 mmHg  Pulse 60  Ht 5' (1.524 m)  Wt 119 lb 12.8 oz (54.341 kg)  BMI 23.40 kg/m2  SpO2 99% BMI: Body mass index is 23.4 kg/(m^2). Well nourished thin F, in no acute distress HEENT: normocephalic, atraumatic Neck: no JVD, carotid bruits or masses Cardiac:  normal S1, S2; RRR; no murmurs, rubs, or gallops Lungs:  clear to auscultation bilaterally, no wheezing, rhonchi or rales Abd: soft, nontender, no hepatomegaly, + BS MS: no deformity or atrophy Ext: no edema Skin: warm and dry, no rash Neuro:  moves all extremities spontaneously, no focal abnormalities noted, follows commands Psych: euthymic mood, full affect   EKG:  Done  today shows atrial paced 60bpm with TWI I, avL, V4-V6. Not significantly changed since last tracing.  Recent Labs: 10/25/2014: Hemoglobin 9.4*; Platelets 258 03/03/2015: BUN 14; Creatinine, Ser 0.90; Potassium 4.0; Pro B Natriuretic peptide (BNP) 467.0*; Sodium 135  No results found for requested labs within last 365 days.   CrCl cannot be calculated (Patient has no serum creatinine result on file.).   Wt Readings from Last 3 Encounters:  06/05/15 119 lb 12.8 oz (54.341 kg)  03/06/15 120 lb (54.432 kg)  03/03/15 120 lb 12.8 oz (54.795 kg)     Other studies reviewed: Additional studies/records reviewed today include: summarized above  ASSESSMENT AND PLAN:  1. Cough/fatigue - etiology not totally clear. She does not really appear volume overloaded on exam. No LEE, abdominal distention, or crackles but she does complain of cough worse with recumbency. Will start with basic labs and CXR to investigate. Device interrogation today in clinic showed normal device function, 3 brief nonsustained VT longest episode of 2 seconds, no atrial fib. Thoracic impedence had an increase in July but has since returned to normal. She  had similar episode in 02/2015 improved with brief increase in Lasix. If above workup is unrevealing, question low output CHF.  2. Chronic systolic CHF/ICM - continue current regimen at present time, pending eval above. 3. CAD as above - denies recent anginal symptoms. She was not taking aspirin because she thought that's what Plavix was. She has been compliant with Plavix. We discussed getting back on aspirin as well. If above workup is unrevealing we can consider updating her ischemic eval in case this is playing a role in her fatigue. I do not think this would be contributing to cough. 4. Anemia - recheck CBC. Denies bleeding. 5. HLD - continue statin.  Disposition: F/u with Dr. [name] in September as planned.  Current medicines are reviewed at length with the patient today.  The patient did not have any concerns regarding medicines.  Raechel Ache PA-C 06/05/2015 10:03 AM     [facility] [address] [place] [address] [phone] (office)  [phone] (fax)

## 2015-06-05 ENCOUNTER — Encounter: Payer: Self-pay | Admitting: Physician Assistant

## 2015-06-05 ENCOUNTER — Ambulatory Visit
Admission: RE | Admit: 2015-06-05 | Discharge: 2015-06-05 | Disposition: A | Discharge status: Home / Self Care | Payer: Medicare Other | Source: Ambulatory Visit | Attending: Physician Assistant | Admitting: Physician Assistant

## 2015-06-05 ENCOUNTER — Ambulatory Visit (INDEPENDENT_AMBULATORY_CARE_PROVIDER_SITE_OTHER): Payer: Medicare Other | Admitting: *Deleted

## 2015-06-05 ENCOUNTER — Encounter: Payer: Self-pay | Admitting: Internal Medicine

## 2015-06-05 ENCOUNTER — Ambulatory Visit (INDEPENDENT_AMBULATORY_CARE_PROVIDER_SITE_OTHER): Payer: Medicare Other | Admitting: Physician Assistant

## 2015-06-05 ENCOUNTER — Telehealth: Payer: Self-pay

## 2015-06-05 VITALS — BP 128/58 | HR 60 | Ht 60.0 in | Wt 119.8 lb

## 2015-06-05 DIAGNOSIS — Z4502 Encounter for adjustment and management of automatic implantable cardiac defibrillator: Secondary | ICD-10-CM

## 2015-06-05 DIAGNOSIS — R059 Cough, unspecified: Secondary | ICD-10-CM

## 2015-06-05 DIAGNOSIS — E785 Hyperlipidemia, unspecified: Secondary | ICD-10-CM

## 2015-06-05 DIAGNOSIS — D649 Anemia, unspecified: Secondary | ICD-10-CM

## 2015-06-05 DIAGNOSIS — R05 Cough: Secondary | ICD-10-CM

## 2015-06-05 DIAGNOSIS — R001 Bradycardia, unspecified: Secondary | ICD-10-CM

## 2015-06-05 DIAGNOSIS — I255 Ischemic cardiomyopathy: Secondary | ICD-10-CM

## 2015-06-05 DIAGNOSIS — I5022 Chronic systolic (congestive) heart failure: Secondary | ICD-10-CM | POA: Diagnosis not present

## 2015-06-05 DIAGNOSIS — I2510 Atherosclerotic heart disease of native coronary artery without angina pectoris: Secondary | ICD-10-CM | POA: Diagnosis not present

## 2015-06-05 DIAGNOSIS — I5023 Acute on chronic systolic (congestive) heart failure: Secondary | ICD-10-CM

## 2015-06-05 DIAGNOSIS — I471 Supraventricular tachycardia, unspecified: Secondary | ICD-10-CM

## 2015-06-05 DIAGNOSIS — R079 Chest pain, unspecified: Secondary | ICD-10-CM

## 2015-06-05 DIAGNOSIS — R5383 Other fatigue: Secondary | ICD-10-CM

## 2015-06-05 LAB — CBC WITH DIFFERENTIAL/PLATELET
BASOS ABS: 0.1 10*3/uL (ref 0.0–0.1)
Basophils Relative: 1.2 % (ref 0.0–3.0)
Eosinophils Absolute: 0.1 10*3/uL (ref 0.0–0.7)
Eosinophils Relative: 2.2 % (ref 0.0–5.0)
HCT: 33 % — ABNORMAL LOW (ref 36.0–46.0)
Hemoglobin: 10.9 g/dL — ABNORMAL LOW (ref 12.0–15.0)
Lymphocytes Relative: 31.6 % (ref 12.0–46.0)
Lymphs Abs: 1.7 10*3/uL (ref 0.7–4.0)
MCHC: 33.1 g/dL (ref 30.0–36.0)
MCV: 79.7 fl (ref 78.0–100.0)
MONO ABS: 0.3 10*3/uL (ref 0.1–1.0)
Monocytes Relative: 6.4 % (ref 3.0–12.0)
NEUTROS PCT: 58.6 % (ref 43.0–77.0)
Neutro Abs: 3.1 10*3/uL (ref 1.4–7.7)
Platelets: 267 10*3/uL (ref 150.0–400.0)
RBC: 4.14 Mil/uL (ref 3.87–5.11)
RDW: 14.8 % (ref 11.5–15.5)
WBC: 5.4 10*3/uL (ref 4.0–10.5)

## 2015-06-05 LAB — CUP PACEART INCLINIC DEVICE CHECK
Battery Remaining Longevity: 107 mo
Battery Voltage: 3.02 V
Brady Statistic AP VP Percent: 0.04 %
Brady Statistic AS VP Percent: 0.01 %
Brady Statistic AS VS Percent: 74.18 %
Brady Statistic RA Percent Paced: 25.81 %
HighPow Impedance: 190 Ohm
HighPow Impedance: 59 Ohm
Lead Channel Pacing Threshold Amplitude: 0.75 V
Lead Channel Pacing Threshold Amplitude: 1.25 V
Lead Channel Pacing Threshold Pulse Width: 0.4 ms
Lead Channel Sensing Intrinsic Amplitude: 0.75 mV
Lead Channel Sensing Intrinsic Amplitude: 16.75 mV
Lead Channel Setting Pacing Amplitude: 2 V
Lead Channel Setting Sensing Sensitivity: 0.3 mV
MDC IDC MSMT LEADCHNL RA IMPEDANCE VALUE: 456 Ohm
MDC IDC MSMT LEADCHNL RV IMPEDANCE VALUE: 418 Ohm
MDC IDC MSMT LEADCHNL RV PACING THRESHOLD PULSEWIDTH: 0.4 ms
MDC IDC SESS DTM: 20160818102402
MDC IDC SET LEADCHNL RV PACING AMPLITUDE: 2.5 V
MDC IDC SET LEADCHNL RV PACING PULSEWIDTH: 0.4 ms
MDC IDC SET ZONE DETECTION INTERVAL: 300 ms
MDC IDC SET ZONE DETECTION INTERVAL: 350 ms
MDC IDC SET ZONE DETECTION INTERVAL: 370 ms
MDC IDC STAT BRADY AP VS PERCENT: 25.78 %
MDC IDC STAT BRADY RV PERCENT PACED: 0.04 %
Zone Setting Detection Interval: 240 ms
Zone Setting Detection Interval: 450 ms

## 2015-06-05 LAB — COMPREHENSIVE METABOLIC PANEL
ALBUMIN: 4.2 g/dL (ref 3.5–5.2)
ALK PHOS: 76 U/L (ref 39–117)
ALT: 13 U/L (ref 0–35)
AST: 19 U/L (ref 0–37)
BUN: 17 mg/dL (ref 6–23)
CALCIUM: 9.3 mg/dL (ref 8.4–10.5)
CO2: 24 mEq/L (ref 19–32)
CREATININE: 1.02 mg/dL (ref 0.40–1.20)
Chloride: 105 mEq/L (ref 96–112)
GFR: 56.54 mL/min — ABNORMAL LOW (ref >60.00)
Glucose, Bld: 94 mg/dL (ref 70–99)
POTASSIUM: 4 meq/L (ref 3.5–5.1)
SODIUM: 137 meq/L (ref 135–145)
TOTAL PROTEIN: 7 g/dL (ref 6.0–8.3)
Total Bilirubin: 0.3 mg/dL (ref 0.2–1.2)

## 2015-06-05 LAB — BRAIN NATRIURETIC PEPTIDE: Pro B Natriuretic peptide (BNP): 456 pg/mL — ABNORMAL HIGH (ref 0.0–100.0)

## 2015-06-05 LAB — TSH: TSH: 3.18 u[IU]/mL (ref 0.35–4.50)

## 2015-06-05 MED ORDER — FUROSEMIDE 20 MG PO TABS: 40.0000 mg | ORAL_TABLET | Freq: Every day | ORAL | Status: DC

## 2015-06-05 NOTE — Patient Instructions (Addendum)
Medication Instructions:  Your physician recommends that you continue on your current medications as directed. Please refer to the Current Medication list given to you today.   Labwork:Today Cbc,Cmet, Tsh, Bnp  Testing/Procedures: A chest x-ray takes a picture of the organs and structures inside the chest, including the heart, lungs, and blood vessels. This test can show several things, including, whether the heart is enlarges; whether fluid is building up in the lungs; and whether pacemaker / defibrillator leads are still in place. (Plesae have this done at [facility] on the 1st Floor)  Follow-Up: Follow up as planned with Dr.Klein in September   Any Other Special Instructions Will Be Listed Below (If Applicable).

## 2015-06-05 NOTE — Telephone Encounter (Signed)
Called patient's granddaughter about lab results. Per [name] PA, Please let patient know BNP was mildly elevated like it was in May when she was felt to have a little extra fluid status on board. Since a brief increase in Lasix helped her in May, please have her increase Lasix to 40mg  BID x 2 days and potassium to 61meq BID x 2 days then go back to regular dose of both. If this does not resolve symptoms please let us know. Patient's granddaughter verbalized understanding and requested refills on Lasix and Potassium. Will send to patient's pharmacy. Patient has recent prescription for Potassium.

## 2015-06-05 NOTE — Progress Notes (Signed)
ICD check in clinic (add on). Normal device function. Thresholds and sensing consistent with previous device measurements. Impedance trends stable over time. 3 NSVT episodes- longest 2 seconds, pk V 182. No mode switches. Histogram distribution appropriate for patient and level of activity. Optivol shows fluid accumulation 8/6-8/16, [name], PA made aware. No changes made this session. Device programmed at appropriate safety margins. Device programmed to optimize intrinsic conduction. Estimated longevity 8.9 years. ROV with SK 06-26-15.

## 2015-06-09 ENCOUNTER — Other Ambulatory Visit: Payer: Self-pay | Admitting: Internal Medicine

## 2015-06-09 ENCOUNTER — Other Ambulatory Visit: Payer: Self-pay | Admitting: Gastroenterology

## 2015-06-26 ENCOUNTER — Encounter: Payer: Self-pay | Admitting: Internal Medicine

## 2015-06-26 ENCOUNTER — Ambulatory Visit (INDEPENDENT_AMBULATORY_CARE_PROVIDER_SITE_OTHER): Payer: Medicare Other | Admitting: Internal Medicine

## 2015-06-26 VITALS — BP 110/62 | HR 73 | Ht 60.0 in | Wt 117.2 lb

## 2015-06-26 DIAGNOSIS — I5022 Chronic systolic (congestive) heart failure: Secondary | ICD-10-CM | POA: Diagnosis not present

## 2015-06-26 DIAGNOSIS — I255 Ischemic cardiomyopathy: Secondary | ICD-10-CM | POA: Diagnosis not present

## 2015-06-26 DIAGNOSIS — Z95810 Presence of automatic (implantable) cardiac defibrillator: Secondary | ICD-10-CM | POA: Diagnosis not present

## 2015-06-26 DIAGNOSIS — R05 Cough: Secondary | ICD-10-CM

## 2015-06-26 DIAGNOSIS — R059 Cough, unspecified: Secondary | ICD-10-CM

## 2015-06-26 NOTE — Patient Instructions (Addendum)
Medication Instructions: - Hold lisinopril until seen by Dr. Rockey Situ: - none  Procedures/Testing: - none  Follow-Up: - You have being referred to : Dr. Elsworth Soho [facility] (cough)  - Your physician recommends that you schedule a follow-up appointment in: 3 months with the device clinic.  - Your physician wants you to follow-up in: 1 year with Dr. [name]. You will receive a reminder letter in the mail two months in advance. If you don't receive a letter, please call our office to schedule the follow-up appointment.  Any Additional Special Instructions Will Be Listed Below (If Applicable).

## 2015-06-26 NOTE — Progress Notes (Signed)
Patient Care Team: [name], MD as PCP - General (Internal Medicine) Danie Binder, MD as Attending Physician (Gastroenterology)   HPI  [name] is a 74 y.o. female Seen in followup for congestive heart failure in the setting of ischemic cardiomyopathy with previously implanted ICD. She was developing chest pain in the fall. Myoview scan was abnormal. She underwent catheterization. This demonstrated 70% proximal right 80% proximal LAD and a 95% left main. EF was 35% and she underwent bypass surgery  Of June 2014 her 6949 lead fractured requiring replacement. At that time she received a new atrial lead 2/2 bradycardia and a new generator.    She was seen in the emergency room 10/25/14 following a trip to [place]. She had problems apparently with shortness of breath edema and chest pain. She  Had been seen in [place].  Her ER evaluation here was notable for a chest x-ray consistent with pulmonary edema. it was treated with IV Lasix. Troponins were normal and ECG was apparently unchanged. Her anemia was noted and was at baseline.  She continues to struggle with coughing. She is now having pain with coughing.  Her cough is non-positional.  She saw DD-PA a few weeks ago who tried her on a diuretic trial; notably her BNP was elevated no 450 range. This was not associated with any change in her cough. Her cough is productive of a white sputum. I was not able to gather through the interpreter whether she has a postnasal drip or not. There has not been a fever.   KAM CHO=HELLO ough JO = GOOD BYE ABAR=- thank you    Past Medical History  Diagnosis Date  . PSVT (paroxysmal supraventricular tachycardia)   . Ischemic cardiomyopathy     a. Anterior MI 2005 with primary PCI. b. EF 30-35%, s/p prior ICD. June 2014 she had a 6949 lead fractured requiring replacement. At that time she received a new atrial lead 2/2 bradycardia and a new generator.  . Chronic systolic heart failure   .  3818 lead 09/06/2011  . Coronary artery disease     a. anterior MI 2005. b. 4V CABG 2014.  [name] Kitchen History of blood transfusion     "after heart surgery" (03/21/2013)  . GERD (gastroesophageal reflux disease)   . Myocardial infarction 02/18/2004    Archie Endo 02/18/2004 (03/20/2013)  . Automatic implantable cardioverter-defibrillator in situ 06/24/2004    Archie Endo 06/24/2004 (03/21/2013)  . Anemia   . Dyslipidemia     Past Surgical History  Procedure Laterality Date  . Cardiac defibrillator placement      Medtronic  . Intraoperative transesophageal echocardiogram  11/03/2012    Procedure: INTRAOPERATIVE TRANSESOPHAGEAL ECHOCARDIOGRAM;  Surgeon: Gaye Pollack, MD;  Location: [facility] OR;  Service: Open Heart Surgery;  Laterality: N/A;  . Coronary artery bypass graft  11/03/2012    Procedure: CORONARY ARTERY BYPASS GRAFTING (CABG);  Surgeon: Gaye Pollack, MD;  Location: [facility];  Service: Open Heart Surgery;  Laterality: N/A;  . Cataract extraction w/ intraocular lens  implant, bilateral Bilateral ~2008  . Knee arthroscopy Left 02/19/2010    Archie Endo 02/19/2010 (03/21/2013)  . Coronary angioplasty  02/18/2004    Archie Endo 02/18/2004 (03/21/2013)  . Colonoscopy N/A 02/26/2014    SLF: 1. Normal mucosa in teh termianl ileum 2. Two colon polyps  removed 3. The left colon in redundant 4. Rectal bleeding due to Moderate sized internal hemorroids   . Left and right heart catheterization with coronary angiogram  N/A 10/30/2012    Procedure: LEFT AND RIGHT HEART CATHETERIZATION WITH CORONARY ANGIOGRAM;  Surgeon: Sherren Mocha, MD;  Location: [facility] CATH LAB;  Service: Cardiovascular;  Laterality: N/A;  . Lead revision N/A 03/22/2013    Procedure: LEAD REVISION;  Surgeon: Evans Lance, MD;  Location: [facility] CATH LAB;  Service: Cardiovascular;  Laterality: N/A;    Current Outpatient Prescriptions  Medication Sig Dispense Refill  . aspirin EC 81 MG tablet Take 1 tablet (81 mg total) by mouth daily.    . benzonatate (TESSALON) 100 MG capsule Take 1  capsule (100 mg total) by mouth 3 (three) times daily as needed for cough. 20 capsule 0  . carvedilol (COREG) 6.25 MG tablet Take 1/2 tablet in the morning. Take 1 tablet in the evening. 45 tablet 5  . clopidogrel (PLAVIX) 75 MG tablet Take 1 tablet (75 mg total) by mouth daily. 30 tablet 11  . diclofenac sodium (VOLTAREN) 1 % GEL Apply 1 application topically daily as needed (for pain). As needed    . esomeprazole (NEXIUM) 40 MG capsule Take 40 mg by mouth daily at 12 noon.    . furosemide (LASIX) 20 MG tablet Take 2 tablets (40 mg total) by mouth daily. 60 tablet 6  . hydrocortisone 2.5 % cream APPLY TO RECTUM TWICE DAILY 28.35 g 0  . lidocaine (XYLOCAINE) 5 % ointment APPLY TO RECTUM FOUR TIMES DAILY FOR 7 DAYS TO HELP RECTAL PAIN 35.44 g 0  . lisinopril (PRINIVIL,ZESTRIL) 2.5 MG tablet Take 1 tablet (2.5 mg total) by mouth at bedtime. 30 tablet 6  . metFORMIN (GLUCOPHAGE) 500 MG tablet Take 500 mg by mouth daily.    . Multiple Vitamin (MULTIVITAMIN WITH MINERALS) TABS tablet Take 1 tablet by mouth daily.    [name] Kitchen NITROSTAT 0.4 MG SL tablet PLACE ONE TABLET UNDER THE TONGUE EVERY FIVE MINUTES AS NEEDED FOR CHEST PAIN 25 tablet 5  . Potassium Chloride ER 20 MEQ TBCR Take 20 mEq by mouth daily. 30 tablet 6  . simvastatin (ZOCOR) 20 MG tablet TAKE ONE TABLET TWO TIMES PER WEEK 8 tablet 11   No current facility-administered medications for this visit.    No Known Allergies  Review of Systems negative except from HPI and PMH  Physical Exam BP 110/62 mmHg  Pulse 73  Ht 5' (1.524 m)  Wt 117 lb 3.2 oz (53.162 kg)  BMI 22.89 kg/m2 Well developed and well nourished in no acute distress HENT normal E scleral and icterus clear Neck Supple JVP flat; carotids brisk and full Clear to ausculation Device pocket without tenderness but not erythematous *Regular rate and rhythm, no murmurs gallops or rub Soft with active bowel sounds No clubbing cyanosis  Edema  discolored nails-black Alert and  oriented, grossly normal motor and sensory function Skin Warm and Dry  ECG demonstrates sinus rhythm at 75    Assessment and  Plan  Ischemic cardiomyopathy  ICD-Medtronic ,The patient's device was interrogated.  The information was reviewed. No changes were made in the programming.     Atrial tachycardia  detected on her device. Nonsustained.  Systolic heart failure chronic acute\  Cough   Her cough is not clearly related to pulmonary edema. His lack of positional affects and non-response to diuretics make that less likely. I wonder, whether, when she was back in [place] whether she didn't come up with some kind of infection. We will arrange for her to see Dr.ALVA in  the pulmonary clinic for his assessment.  His productivity  makes it unlikely that it is related to her ACE inhibitor. We will discontinue it so as to exclude it as a potential concern  We will continue her on low-dose diuretics.

## 2015-06-30 LAB — CUP PACEART INCLINIC DEVICE CHECK
Battery Remaining Longevity: 107 mo
Battery Voltage: 3.02 V
Brady Statistic AP VP Percent: 0.04 %
Brady Statistic AS VS Percent: 64.25 %
Brady Statistic RA Percent Paced: 35.75 %
HIGH POWER IMPEDANCE MEASURED VALUE: 59 Ohm
HighPow Impedance: 190 Ohm
Lead Channel Pacing Threshold Amplitude: 0.625 V
Lead Channel Pacing Threshold Amplitude: 1 V
Lead Channel Pacing Threshold Pulse Width: 0.4 ms
Lead Channel Sensing Intrinsic Amplitude: 1.125 mV
Lead Channel Sensing Intrinsic Amplitude: 19.375 mV
Lead Channel Setting Pacing Amplitude: 2 V
Lead Channel Setting Pacing Amplitude: 2.5 V
Lead Channel Setting Sensing Sensitivity: 0.3 mV
MDC IDC MSMT LEADCHNL RA IMPEDANCE VALUE: 456 Ohm
MDC IDC MSMT LEADCHNL RV IMPEDANCE VALUE: 475 Ohm
MDC IDC MSMT LEADCHNL RV PACING THRESHOLD PULSEWIDTH: 0.4 ms
MDC IDC SESS DTM: 20160908164958
MDC IDC SET LEADCHNL RV PACING PULSEWIDTH: 0.4 ms
MDC IDC SET ZONE DETECTION INTERVAL: 300 ms
MDC IDC SET ZONE DETECTION INTERVAL: 350 ms
MDC IDC STAT BRADY AP VS PERCENT: 35.71 %
MDC IDC STAT BRADY AS VP PERCENT: 0 %
MDC IDC STAT BRADY RV PERCENT PACED: 0.04 %
Zone Setting Detection Interval: 240 ms
Zone Setting Detection Interval: 370 ms
Zone Setting Detection Interval: 450 ms

## 2015-07-01 ENCOUNTER — Encounter: Payer: Self-pay | Admitting: Internal Medicine

## 2015-07-01 ENCOUNTER — Ambulatory Visit (INDEPENDENT_AMBULATORY_CARE_PROVIDER_SITE_OTHER): Payer: Medicare Other | Admitting: Internal Medicine

## 2015-07-01 VITALS — BP 114/60 | HR 67 | Ht <= 58 in | Wt 119.0 lb

## 2015-07-01 DIAGNOSIS — R05 Cough: Secondary | ICD-10-CM

## 2015-07-01 DIAGNOSIS — I255 Ischemic cardiomyopathy: Secondary | ICD-10-CM | POA: Diagnosis not present

## 2015-07-01 DIAGNOSIS — R058 Other specified cough: Secondary | ICD-10-CM

## 2015-07-01 NOTE — Assessment & Plan Note (Addendum)
Trial off acei 06/26/15 ? When really stopped ?   The most common causes of chronic cough in immunocompetent adults include the following: upper airway cough syndrome (UACS), previously referred to as postnasal drip syndrome (PNDS), which is caused by variety of rhinosinus conditions; (2) asthma; (3) GERD; (4) chronic bronchitis from cigarette smoking or other inhaled environmental irritants; (5) nonasthmatic eosinophilic bronchitis; and (6) bronchiectasis.   These conditions, singly or in combination, have accounted for up to 94% of the causes of chronic cough in prospective studies.   Other conditions have constituted no >6% of the causes in prospective studies These have included bronchogenic carcinoma, chronic interstitial pneumonia, sarcoidosis, left ventricular failure, ACEI-induced cough, and aspiration from a condition associated with pharyngeal dysfunction.    Chronic cough is often simultaneously caused by more than one condition. A single cause has been found from 38 to 82% of the time, multiple causes from 18 to 62%. Multiply caused cough has been the result of three diseases up to 42% of the time.       Based on hx and exam, this is most likely:  Classic Upper airway cough syndrome, so named because it's frequently impossible to sort out how much is  CR/sinusitis with freq throat clearing (which can be related to primary GERD)   vs  causing  secondary (" extra esophageal")  GERD from wide swings in gastric pressure that occur with throat clearing, often  promoting self use of mint and menthol lozenges that reduce the lower esophageal sphincter tone and exacerbate the problem further in a cyclical fashion.   These are the same pts (now being labeled as having "irritable larynx syndrome" by some cough centers) who not infrequently have a history of having failed to tolerate ace inhibitors,  dry powder inhalers or biphosphonates or report having atypical reflux symptoms that don't respond to  standard doses of PPI , and are easily confused as having aecopd or asthma flares by even experienced allergists/ pulmonologists.   The first step is to maximize acid suppression and eliminate acei then regroup if the cough persists.  I had an extended discussion with the patient via husband /daughter helping communicate reviewing all relevant studies completed to date and  lasting 35 min  1) Explained: The standardized cough guidelines published in Chest by Lissa Morales in 2006 are still the best available and consist of a multiple step process (up to 12!) , not a single office visit,  and are intended  to address this problem logically,  with an alogrithm dependent on response to empiric treatment at  each progressive step  to determine a specific diagnosis with  minimal addtional testing needed. Therefore if adherence is an issue or can't be accurately verified,  it's very unlikely the standard evaluation and treatment will be successful here.    Furthermore, response to therapy (other than acute cough suppression, which should only be used short term with avoidance of narcotic containing cough syrups if possible), can be a gradual process for which the patient may perceive immediate benefit.  Unlike going to an eye doctor where the best perscription is almost always the first one and is immediately effective, this is almost never the case in the management of chronic cough syndromes. Therefore the patient needs to commit up front to consistently adhere to recommendations  for up to 6 weeks of therapy directed at the likely underlying problem(s) before the response can be reasonably evaluated.     2) Each maintenance medication was  reviewed in detail including most importantly the difference between maintenance and prns and under what circumstances the prns are to be triggered using an action plan format that is not reflected in the computer generated alphabetically organized AVS.    Please see  instructions for details which were reviewed in writing and the patient given a copy highlighting the part that I personally wrote and discussed at today's ov.   See instructions for specific recommendations which were reviewed directly with the patient who was given a copy with highlighter outlining the key components.

## 2015-07-01 NOTE — Patient Instructions (Signed)
Nexium mg  Take 30-60 min before first meal of the day and Pepcid ac (famotidine) 20 mg one @  bedtime until cough is completely gone for at least a week without the need for cough suppression  GERD (REFLUX)  is an extremely common cause of respiratory symptoms just like yours , many times with no obvious heartburn at all.    It can be treated with medication, but also with lifestyle changes including elevation of the head of your bed (ideally with 6 inch  bed blocks),  Smoking cessation, avoidance of late meals, excessive alcohol, and avoid fatty foods, chocolate, peppermint, colas, red wine, and acidic juices such as orange juice.  NO MINT OR MENTHOL PRODUCTS SO NO COUGH DROPS  USE SUGARLESS CANDY INSTEAD (Jolley ranchers or Stover's or Life Savers) or even ice chips will also do - the key is to swallow to prevent all throat clearing. NO OIL BASED VITAMINS - use powdered substitutes.   For cough use delsym 2 tsp every 12 hours as needed but should be able to taper off    See Dr [name] if not improved in 6 weeks  and I will send him my recommendations as to the next step if not better

## 2015-07-01 NOTE — Progress Notes (Signed)
   Subjective:    Patient ID: [name], female    DOB: October 22, 1940,    MRN: [id number]  HPI  74 yo [place] female never smoker with new onset cough fall 2015 rec dc acei 06/26/15 by Dr [name] and referred to pulmonary clinic 07/01/2015     07/01/2015 1st [facility] office visit/ [name]   Chief Complaint  Patient presents with  . Pulmonary Consult    Referred by Dr. Caryl Mathews. Pt c/o cough for the past year. Cough is non prod.   still has lisinopril in her med bag and lots of confusion related to language barrier as to how she takes her meds On nexium p bfast/ cough is worse when awake, mostly dry x one year p insidious onset whilte on ACEi which was supposed to be stopped 06/26/15 Assoc with sense of throat congestion, mild dysphagia  No obvious patterns in day to day or daytime variabilty or assoc sob or cp or chest tightness, subjective wheeze overt sinus or active hb symptoms. No unusual exp hx or h/o childhood pna/ asthma or knowledge of premature birth.  Sleeping ok without nocturnal  or early am exacerbation  of respiratory  c/o's or need for noct saba. Also denies any obvious fluctuation of symptoms with weather or environmental changes or other aggravating or alleviating factors except as outlined above   Current Medications, Allergies, Complete Past Medical History, Past Surgical History, Family History, and Social History were reviewed in [employer] record.            Review of Systems  Constitutional: Negative for fever, chills and unexpected weight change.  HENT: Positive for dental problem. Negative for congestion, ear pain, nosebleeds, postnasal drip, rhinorrhea, sinus pressure, sneezing, sore throat, trouble swallowing and voice change.   Eyes: Negative for visual disturbance.  Respiratory: Positive for cough. Negative for choking and shortness of breath.   Cardiovascular: Negative for chest pain and leg swelling.  Gastrointestinal: Negative for  vomiting, abdominal pain and diarrhea.  Genitourinary: Negative for difficulty urinating.       Acid heartburn  Musculoskeletal: Positive for arthralgias.  Skin: Negative for rash.  Allergic/Immunologic: Positive for immunocompromised state.  Neurological: Negative for tremors, syncope and headaches.  Hematological: Does not bruise/bleed easily.       Objective:   Physical Exam   amb indian female nad  Wt Readings from Last 3 Encounters:  07/01/15 119 lb (53.978 kg)  06/26/15 117 lb 3.2 oz (53.162 kg)  06/05/15 119 lb 12.8 oz (54.341 kg)    Vital signs reviewed   HEENT: nl dentition, turbinates, and orophanx. Nl external ear canals without cough reflex   NECK :  without JVD/Nodes/TM/ nl carotid upstrokes bilaterally   LUNGS: no acc muscle use, clear to A and P bilaterally without cough on insp or exp maneuvers   CV:  RRR  no s3 or murmur or increase in P2, no edema   ABD:  soft and nontender with nl excursion in the supine position. No bruits or organomegaly, bowel sounds nl  MS:  warm without deformities, calf tenderness, cyanosis or clubbing  SKIN: warm and dry without lesions    NEURO:  alert, approp, no deficits     I personally reviewed images and agree with radiology impression as follows:  CXR:  06/05/15 There is no pneumonia, CHF, nor other acute cardiopulmonary abnormality.         Assessment & Plan:

## 2015-07-09 ENCOUNTER — Other Ambulatory Visit: Payer: Self-pay | Admitting: Gastroenterology

## 2015-07-10 ENCOUNTER — Ambulatory Visit (INDEPENDENT_AMBULATORY_CARE_PROVIDER_SITE_OTHER): Payer: Medicare Other | Admitting: Gastroenterology

## 2015-07-10 ENCOUNTER — Encounter: Payer: Self-pay | Admitting: Gastroenterology

## 2015-07-10 VITALS — BP 102/56 | HR 67 | Temp 97.4°F | Ht 61.0 in | Wt 120.0 lb

## 2015-07-10 DIAGNOSIS — I255 Ischemic cardiomyopathy: Secondary | ICD-10-CM

## 2015-07-10 DIAGNOSIS — K648 Other hemorrhoids: Secondary | ICD-10-CM

## 2015-07-10 DIAGNOSIS — K644 Residual hemorrhoidal skin tags: Secondary | ICD-10-CM

## 2015-07-10 MED ORDER — LIDOCAINE 5 % EX OINT: TOPICAL_OINTMENT | CUTANEOUS | Status: DC

## 2015-07-10 NOTE — Assessment & Plan Note (Signed)
SYMPTOMS NOT IDEALLY CONTROLLED AND COMPLICATED BY CONSTIPATION.  DRINK WATER TO KEEP YOUR URINE LIGHT YELLOW. EAT FIBER, FRUITS VEGETABLES, AND BEANS. TAKE IBGARD 2 DAILY WITH 8 OZ OF WATER. IF NO SATISFACTORY BOWEL MOVEMENT AFTRE 2 WEEKS, INCREASE IBGARD TO 2 PILLS WITH 8 OZ WATER TWICE DAILY. APPLY LIDOCAINE OINTMENT FOUR TIMES A DAY FOR 7 DAYS AT A TIME. ALTERNATE WITH PREPARATION H OINTMENT. FOLLOW UP IN 4 MOS.  PLEASE CALL IF YOU WANT TO SEE A SURGEON ABOUT HEMORRHOID SURGERY.  GREATER THAN 50% WAS SPENT IN COUNSELING & COORDINATION OF CARE WITH THE PATIENT: DISCUSSED DIFFERENTIAL DIAGNOSIS, PROCEDURE, BENEFITS, RISKS, AND MANAGEMENT OF HEMOPRRHOIDS. TOTAL ENCOUNTER TIME: 25 MINS.

## 2015-07-10 NOTE — Patient Instructions (Signed)
DRINK WATER TO KEEP YOUR URINE LIGHT YELLOW.  EAT FIBER, FRUITS VEGETABLES, AND BEANS.  TAKE IBGARD 2 DAILY WITH 8 OZ OF WATER. IF NO SATISFACTORY BOWEL MOVEMENT AFTRE 2 WEEKS, INCREASE IBGARD TO 2 PILLS WITH 8 OZ WATER TWICE DAILY.  APPLY LIDOCAINE OINTMENT FOUR TIMES A DAY FOR 7 DAYS AT A TIME. ALTERNATE WITH PREPARATION H OINTMENT.  FOLLOW UP IN 4 MOS.   PLEASE CALL IF YOU WANT TO SEE A SURGEON ABOUT HEMORRHOID SURGERY.

## 2015-07-10 NOTE — Assessment & Plan Note (Signed)
SYMPTOMS CONTROLLED/RESOLVED.  CONTINUE TO MONITOR SYMPTOMS. 

## 2015-07-10 NOTE — Progress Notes (Signed)
Subjective:    Patient ID: [name], female    DOB: Oct 10, 1941, 74 y.o.   MRN: [id number]  [name], MD   HPI HEMORRHOIDS BETTER. CREAM HELPS. SOMETIME TROUBLE HAVING A BOWEL MOVEMENT. PT DENIES FEVER, CHILLS, HEMATOCHEZIA, nausea, vomiting, melena, diarrhea, CHEST PAIN, SHORTNESS OF BREATH,  CHANGE IN BOWEL IN HABITS,abdominal pain, problems swallowing, OR heartburn or indigestion.  Past Medical History  Diagnosis Date  . PSVT (paroxysmal supraventricular tachycardia)   . Ischemic cardiomyopathy     a. Anterior MI 2005 with primary PCI. b. EF 30-35%, s/p prior ICD. June 2014 she had a 6949 lead fractured requiring replacement. At that time she received a new atrial lead 2/2 bradycardia and a new generator.  . Chronic systolic heart failure   . 2542 lead 09/06/2011  . Coronary artery disease     a. anterior MI 2005. b. 4V CABG 2014.  [name] Kitchen History of blood transfusion     "after heart surgery" (03/21/2013)  . GERD (gastroesophageal reflux disease)   . Myocardial infarction 02/18/2004    Archie Endo 02/18/2004 (03/20/2013)  . Automatic implantable cardioverter-defibrillator in situ 06/24/2004    Archie Endo 06/24/2004 (03/21/2013)  . Anemia   . Dyslipidemia    Past Surgical History  Procedure Laterality Date  . Cardiac defibrillator placement      Medtronic  . Intraoperative transesophageal echocardiogram  11/03/2012    Procedure: INTRAOPERATIVE TRANSESOPHAGEAL ECHOCARDIOGRAM;  Surgeon: Gaye Pollack, MD;  Location: [facility];  Service: Open Heart Surgery;  Laterality: N/A;  . Coronary artery bypass graft  11/03/2012    Procedure: CORONARY ARTERY BYPASS GRAFTING (CABG);  Surgeon: Gaye Pollack, MD;  Location: [facility];  Service: Open Heart Surgery;  Laterality: N/A;  . Cataract extraction w/ intraocular lens  implant, bilateral Bilateral ~2008  . Knee arthroscopy Left 02/19/2010    Archie Endo 02/19/2010 (03/21/2013)  . Coronary angioplasty  02/18/2004    Archie Endo 02/18/2004 (03/21/2013)  . Colonoscopy N/A  02/26/2014    SLF: 1. Normal mucosa in teh termianl ileum 2. Two colon polyps  removed 3. The left colon in redundant 4. Rectal bleeding due to Moderate sized internal hemorroids   . Left and right heart catheterization with coronary angiogram N/A 10/30/2012    Procedure: LEFT AND RIGHT HEART CATHETERIZATION WITH CORONARY ANGIOGRAM;  Surgeon: Sherren Mocha, MD;  Location: [facility] CATH LAB;  Service: Cardiovascular;  Laterality: N/A;  . Lead revision N/A 03/22/2013    Procedure: LEAD REVISION;  Surgeon: Evans Lance, MD;  Location: [facility] CATH LAB;  Service: Cardiovascular;  Laterality: N/A;   No Known Allergies  Current Outpatient Prescriptions  Medication Sig Dispense Refill  . aspirin EC 81 MG tablet Take 1 tablet (81 mg total) by mouth daily.    . carvedilol (COREG) 6.25 MG tablet Take 1/2 tablet in the morning. Take 1 tablet in the evening.    . clopidogrel (PLAVIX) 75 MG tablet Take 1 tablet (75 mg total) by mouth daily.    . diclofenac sodium (VOLTAREN) 1 % GEL Apply 1 application topically daily as needed (for pain). As needed    . esomeprazole (NEXIUM) 40 MG capsule Take 40 mg by mouth daily at 12 noon.    . furosemide (LASIX) 20 MG tablet Take 2 tablets (40 mg total) by mouth daily.    . metFORMIN (GLUCOPHAGE) 500 MG tablet Take 500 mg by mouth daily.    . Multiple Vitamin (MULTIVITAMIN WITH MINERALS) TABS tablet Take 1 tablet by mouth daily.    [name] Kitchen  NITROSTAT 0.4 MG SL tablet PLACE ONE TABLET UNDER THE TONGUE EVERY FIVE MINUTES AS NEEDED FOR CHEST PAIN    . Potassium Chloride ER 20 MEQ TBCR Take 20 mEq by mouth daily.    . simvastatin (ZOCOR) 20 MG tablet TAKE ONE TABLET TWO TIMES PER WEEK    .      .      .       Review of Systems PER HPI OTHERWISE ALL SYSTEMS ARE NEGATIVE.    Objective:   Physical Exam  Constitutional: She is oriented to person, place, and time. She appears well-developed and well-nourished. No distress.  HENT:  Head: Normocephalic and atraumatic.  Mouth/Throat:  Oropharynx is clear and moist. No oropharyngeal exudate.  Eyes: Pupils are equal, round, and reactive to light. No scleral icterus.  Neck: Normal range of motion. Neck supple.  Cardiovascular: Normal rate, regular rhythm and normal heart sounds.   Pulmonary/Chest: Effort normal and breath sounds normal. No respiratory distress.  Abdominal: Soft. Bowel sounds are normal. She exhibits no distension. There is no tenderness.  Genitourinary: Rectal exam shows external hemorrhoid and tenderness (MILD). Rectal exam shows no fissure and anal tone normal.     Musculoskeletal: She exhibits no edema.  Lymphadenopathy:    She has no cervical adenopathy.  Neurological: She is alert and oriented to person, place, and time.  NO FOCAL DEFICITS   Psychiatric: She has a normal mood and affect.  Vitals reviewed.         Assessment & Plan:

## 2015-08-11 ENCOUNTER — Other Ambulatory Visit: Payer: Self-pay

## 2015-08-12 ENCOUNTER — Other Ambulatory Visit: Payer: Self-pay | Admitting: Nurse Practitioner

## 2015-08-12 MED ORDER — HYDROCORTISONE 2.5 % EX CREA: TOPICAL_CREAM | CUTANEOUS | Status: DC

## 2015-09-07 ENCOUNTER — Other Ambulatory Visit: Payer: Self-pay | Admitting: Gastroenterology

## 2015-09-07 ENCOUNTER — Other Ambulatory Visit: Payer: Self-pay | Admitting: Internal Medicine

## 2015-09-25 ENCOUNTER — Ambulatory Visit (INDEPENDENT_AMBULATORY_CARE_PROVIDER_SITE_OTHER): Payer: Medicare Other | Admitting: *Deleted

## 2015-09-25 ENCOUNTER — Encounter: Payer: Self-pay | Admitting: Internal Medicine

## 2015-09-25 DIAGNOSIS — I5022 Chronic systolic (congestive) heart failure: Secondary | ICD-10-CM | POA: Diagnosis not present

## 2015-09-25 DIAGNOSIS — I471 Supraventricular tachycardia: Secondary | ICD-10-CM

## 2015-09-26 LAB — CUP PACEART INCLINIC DEVICE CHECK
Battery Voltage: 3.02 V
Brady Statistic RA Percent Paced: 20.5 %
Brady Statistic RV Percent Paced: 0.04 %
Date Time Interrogation Session: 20161208184159
HIGH POWER IMPEDANCE MEASURED VALUE: 58 Ohm
Implantable Lead Implant Date: 20140605
Implantable Lead Location: 753860
Implantable Lead Model: 5076
Lead Channel Impedance Value: 304 Ohm
Lead Channel Impedance Value: 418 Ohm
Lead Channel Pacing Threshold Pulse Width: 0.4 ms
Lead Channel Sensing Intrinsic Amplitude: 13.5 mV
Lead Channel Setting Pacing Pulse Width: 0.4 ms
Lead Channel Setting Sensing Sensitivity: 0.3 mV
MDC IDC LEAD IMPLANT DT: 20140605
MDC IDC LEAD LOCATION: 753859
MDC IDC LEAD MODEL: 6935
MDC IDC MSMT BATTERY REMAINING LONGEVITY: 104 mo
MDC IDC MSMT LEADCHNL RA IMPEDANCE VALUE: 418 Ohm
MDC IDC MSMT LEADCHNL RA PACING THRESHOLD AMPLITUDE: 0.875 V
MDC IDC MSMT LEADCHNL RA SENSING INTR AMPL: 1 mV
MDC IDC MSMT LEADCHNL RA SENSING INTR AMPL: 1.125 mV
MDC IDC MSMT LEADCHNL RV PACING THRESHOLD AMPLITUDE: 0.75 V
MDC IDC MSMT LEADCHNL RV PACING THRESHOLD PULSEWIDTH: 0.4 ms
MDC IDC MSMT LEADCHNL RV SENSING INTR AMPL: 14.75 mV
MDC IDC SET LEADCHNL RA PACING AMPLITUDE: 2 V
MDC IDC SET LEADCHNL RV PACING AMPLITUDE: 2.5 V
MDC IDC STAT BRADY AP VP PERCENT: 0.03 %
MDC IDC STAT BRADY AP VS PERCENT: 20.47 %
MDC IDC STAT BRADY AS VP PERCENT: 0.01 %
MDC IDC STAT BRADY AS VS PERCENT: 79.49 %

## 2015-09-26 NOTE — Progress Notes (Signed)
ICD check in clinic. Normal device function. Thresholds and sensing consistent with previous device measurements. Impedance trends stable over time. (1) "NSVT" episode x 1 sec---PAT per EGM. No mode switches. Histogram distribution appropriate for patient and level of activity. Stable thoracic impedance. No changes made this session. Device programmed at appropriate safety margins. Device programmed to optimize intrinsic conduction. Estimated longevity 8.6 years. Pt enrolled in remote follow-up. Plan to f/u w/ the [facility] in 3 months. Patient education completed including shock plan. Alert tones demonstrated for patient.

## 2015-10-06 ENCOUNTER — Other Ambulatory Visit: Payer: Self-pay | Admitting: Gastroenterology

## 2015-11-08 ENCOUNTER — Other Ambulatory Visit: Payer: Self-pay | Admitting: Gastroenterology

## 2015-12-03 ENCOUNTER — Ambulatory Visit (INDEPENDENT_AMBULATORY_CARE_PROVIDER_SITE_OTHER): Payer: Medicare Other

## 2015-12-03 ENCOUNTER — Encounter: Payer: Self-pay | Admitting: Orthopaedic Surgery

## 2015-12-03 ENCOUNTER — Ambulatory Visit (INDEPENDENT_AMBULATORY_CARE_PROVIDER_SITE_OTHER): Payer: Medicare Other | Admitting: Orthopaedic Surgery

## 2015-12-03 ENCOUNTER — Ambulatory Visit: Payer: Self-pay | Admitting: Orthopaedic Surgery

## 2015-12-03 VITALS — BP 117/64 | HR 74 | Temp 97.2°F | Ht 60.0 in | Wt 117.8 lb

## 2015-12-03 DIAGNOSIS — M5441 Lumbago with sciatica, right side: Secondary | ICD-10-CM | POA: Diagnosis not present

## 2015-12-03 DIAGNOSIS — M25511 Pain in right shoulder: Secondary | ICD-10-CM | POA: Insufficient documentation

## 2015-12-03 DIAGNOSIS — M545 Low back pain, unspecified: Secondary | ICD-10-CM | POA: Insufficient documentation

## 2015-12-03 DIAGNOSIS — M25559 Pain in unspecified hip: Secondary | ICD-10-CM

## 2015-12-03 DIAGNOSIS — M25551 Pain in right hip: Secondary | ICD-10-CM | POA: Diagnosis not present

## 2015-12-03 MED ORDER — PREDNISONE 5 MG (21) PO TBPK: 5.0000 mg | ORAL_TABLET | Freq: Every day | ORAL | Status: DC

## 2015-12-03 NOTE — Progress Notes (Signed)
Patient [id number] [name], female DOB:[birth year], 75 y.o. [id number]  Chief Complaint  Patient presents with  . Back Pain    LOWER BACK PAIN WITH BILATERAL NUMBNESS IN LEGS  . Shoulder Pain    RIGHT SIDED    HPI  [name] is a 75 y.o. female who has one month history of lower back pain with right sided paresthesias to the right thigh but not below the knee.  She has constant pain that is sharp and deep at times.  She has no trauma.  She has no bowel or bladder problems. She cannot take NSAIDs.  She has no injury or trauma.  She has tired ice and heat with little help.   She also has right shoulder pain with overhead positioning.  She has no numbness.  She has no redness.  She has no neck pain.  She does exercises which help the shoulder.  She has no trauma.  HPI  Body mass index is 23.01 kg/(m^2).  Review of Systems  Patient does not have Diabetes Mellitus. Patient has hypertension. Patient does not have COPD or shortness of breath. Patient does not have BMI > 35. Patient does not have current smoking history.  Review of Systems  Respiratory: Positive for cough.   Cardiovascular: Positive for chest pain, palpitations and leg swelling.       Has pacemaker, ischemic cardiomyopathy and PSVT  Gastrointestinal:       GERD  Musculoskeletal: Positive for back pain, arthralgias (right shoulder, right hip, left sholder) and gait problem (pain right).    Past Medical History  Diagnosis Date  . PSVT (paroxysmal supraventricular tachycardia) ([place])   . Ischemic cardiomyopathy     a. Anterior MI 2005 with primary PCI. b. EF 30-35%, s/p prior ICD. June 2014 she had a 6949 lead fractured requiring replacement. At that time she received a new atrial lead 2/2 bradycardia and a new generator.  . Chronic systolic heart failure ([place])   . [id number] lead 09/06/2011  . Coronary artery disease     a. anterior MI 2005. b. 4V CABG 2014.  [name] Kitchen History of blood transfusion     "after heart  surgery" (03/21/2013)  . GERD (gastroesophageal reflux disease)   . Myocardial infarction ([place]) 02/18/2004    Archie Endo 02/18/2004 (03/20/2013)  . Automatic implantable cardioverter-defibrillator in situ 06/24/2004    Archie Endo 06/24/2004 (03/21/2013)  . Anemia   . Dyslipidemia     Past Surgical History  Procedure Laterality Date  . Cardiac defibrillator placement      Medtronic  . Intraoperative transesophageal echocardiogram  11/03/2012    Procedure: INTRAOPERATIVE TRANSESOPHAGEAL ECHOCARDIOGRAM;  Surgeon: Gaye Pollack, MD;  Location: [facility] OR;  Service: Open Heart Surgery;  Laterality: N/A;  . Coronary artery bypass graft  11/03/2012    Procedure: CORONARY ARTERY BYPASS GRAFTING (CABG);  Surgeon: Gaye Pollack, MD;  Location: [place];  Service: Open Heart Surgery;  Laterality: N/A;  . Cataract extraction w/ intraocular lens  implant, bilateral Bilateral ~2008  . Knee arthroscopy Left 02/19/2010    Archie Endo 02/19/2010 (03/21/2013)  . Coronary angioplasty  02/18/2004    Archie Endo 02/18/2004 (03/21/2013)  . Colonoscopy N/A 02/26/2014    SLF: 1. Normal mucosa in teh termianl ileum 2. Two colon polyps  removed 3. The left colon in redundant 4. Rectal bleeding due to Moderate sized internal hemorroids   . Left and right heart catheterization with coronary angiogram N/A 10/30/2012    Procedure: LEFT AND RIGHT HEART CATHETERIZATION WITH CORONARY  Cyril Loosen;  Surgeon: Sherren Mocha, MD;  Location: [facility] CATH LAB;  Service: Cardiovascular;  Laterality: N/A;  . Lead revision N/A 03/22/2013    Procedure: LEAD REVISION;  Surgeon: Evans Lance, MD;  Location: [facility] CATH LAB;  Service: Cardiovascular;  Laterality: N/A;    Family History  Problem Relation Age of Onset  . Colon cancer Neg Hx   . Heart attack Mother   . Hypertension Father   . Heart attack Sister   . Heart attack Brother     Social History Social History  Substance Use Topics  . Smoking status: Never Smoker   . Smokeless tobacco: Never Used  . Alcohol Use: No    No  Known Allergies  Current Outpatient Prescriptions  Medication Sig Dispense Refill  . carvedilol (COREG) 6.25 MG tablet Take 1/2 tablet in the morning. Take 1 tablet in the evening. 45 tablet 5  . clopidogrel (PLAVIX) 75 MG tablet TAKE ONE TABLET BY MOUTH DAILY. STOP OMEPRAZOLE. 30 tablet 6  . diclofenac sodium (VOLTAREN) 1 % GEL Apply 1 application topically daily as needed (for pain). As needed    . esomeprazole (NEXIUM) 40 MG capsule Take 40 mg by mouth daily at 12 noon.    . furosemide (LASIX) 20 MG tablet Take 2 tablets (40 mg total) by mouth daily. 60 tablet 6  . hydrocortisone 2.5 % cream APPLY TO RECTUM TWICE DAILY. 30 g 0  . hydrocortisone 2.5 % cream APPLY TO RECTUM TWICE DAILY FOR 7 DAYS 30 g 1  . lidocaine (XYLOCAINE) 5 % ointment APPLY TO RECTUM FOUR TIMES DAILY FOR 7 DAYS TO HELP RECTAL PAIN. 35.44 g 0  . metFORMIN (GLUCOPHAGE) 500 MG tablet Take 500 mg by mouth daily.    . Multiple Vitamin (MULTIVITAMIN WITH MINERALS) TABS tablet Take 1 tablet by mouth daily.    [name] Kitchen NITROSTAT 0.4 MG SL tablet PLACE ONE TABLET UNDER THE TONGUE EVERY FIVE MINUTES AS NEEDED FOR CHEST PAIN 25 tablet 5  . Potassium Chloride ER 20 MEQ TBCR Take 20 mEq by mouth daily. 30 tablet 6  . simvastatin (ZOCOR) 20 MG tablet TAKE ONE TABLET TWO TIMES PER WEEK 8 tablet 6  . aspirin EC 81 MG tablet Take 81 mg by mouth daily. Reported on 12/03/2015    . benzonatate (TESSALON) 100 MG capsule Take 1 capsule (100 mg total) by mouth 3 (three) times daily as needed for cough. (Patient not taking: Reported on 07/10/2015) 20 capsule 0  . predniSONE (STERAPRED UNI-PAK 21 TAB) 5 MG (21) TBPK tablet Take 1 tablet (5 mg total) by mouth daily. Take as directed 1 tablet 0   No current facility-administered medications for this visit.     Physical Exam  Blood pressure 117/64, pulse 74, temperature 97.2 F (36.2 C), height 5' (1.524 m), weight 117 lb 12.8 oz (53.434 kg).  Constitutional: overall normal hygiene, normal  nutrition, well developed, normal grooming, normal body habitus. Assistive device:none  Musculoskeletal: gait and station Limp slight to the right, muscle tone and strength are normal, no tremors or atrophy is present.  .  Neurological: coordination overall normal.  Deep tendon reflex/nerve stretch intact.  Sensation normal.  Cranial nerves II-XII intact.   Skin:normal overall no scars, lesions, ulcers or rashes. No psoriasis.  Psychiatric: Alert and oriented x 3.  Recent memory intact, remote memory unclear.  Normal mood and affect. Well groomed.  Good eye contact.  Cardiovascular: overall no swelling, no varicosities, no edema bilaterally, normal temperatures of the legs  and arms, no clubbing, cyanosis and good capillary refill.  Lymphatic: palpation is normal.   Extremities:she has pain of the right shoulder diffusely but no swelling, no defects.  She has diffuse pain of the lower back with no spasm and no defects.  She has slight tenderness of the right hip area but no redness. Inspection as above Strength and tone normal Range of motion full of the right shoulder but tender in overhead position.  Back has flexion of 35, extension 10, lateral bend normal. SLR is normal.  Additional services performed: x-rays of the right shoulder, lumbar spine and pelvis.  I went over the findings with the patient and her daughter.  She cannot take NSAIDs.  I will give prednisone dose pack.  I explained how to take.  I have ordered a shower chair for her per her request.   PLAN Call if any problems.  Precautions discussed.  Continue current medications.   Return to clinic 2 wks

## 2015-12-03 NOTE — Patient Instructions (Signed)
Take medicine as directed. Rx for shower chair has been given.

## 2015-12-08 ENCOUNTER — Other Ambulatory Visit: Payer: Self-pay | Admitting: Gastroenterology

## 2015-12-08 ENCOUNTER — Other Ambulatory Visit: Payer: Self-pay | Admitting: Internal Medicine

## 2015-12-08 ENCOUNTER — Other Ambulatory Visit: Payer: Self-pay | Admitting: Nurse Practitioner

## 2015-12-17 ENCOUNTER — Ambulatory Visit (INDEPENDENT_AMBULATORY_CARE_PROVIDER_SITE_OTHER): Payer: Medicare Other | Admitting: Orthopaedic Surgery

## 2015-12-17 ENCOUNTER — Encounter: Payer: Self-pay | Admitting: Orthopaedic Surgery

## 2015-12-17 VITALS — BP 131/62 | HR 62 | Temp 97.3°F | Resp 16 | Ht 60.0 in | Wt 118.0 lb

## 2015-12-17 DIAGNOSIS — M5441 Lumbago with sciatica, right side: Secondary | ICD-10-CM

## 2015-12-17 NOTE — Progress Notes (Signed)
Patient [id number] [name], female DOB:[birth year], 75 y.o. [id number]  No chief complaint on file.   HPI  [name] is a 75 y.o. female who has had lower back pain with no radiation.  She was given prednisone dose pack last visit.  She is markedly improved with no pain now.  She has no paresthesias and no radiation.  She has no bowel or bladder problems.   HPI  Body mass index is 23.05 kg/(m^2).  Review of Systems  Constitutional:       Patient does not have Diabetes Mellitus. Patient has hypertension. Patient does not have COPD or shortness of breath. Patient does not have BMI > 35. Patient does not have current smoking history.  Respiratory: Positive for cough.   Cardiovascular: Positive for chest pain, palpitations and leg swelling.       Has pacemaker, ischemic cardiomyopathy and PSVT  Gastrointestinal:       GERD  Musculoskeletal: Positive for back pain, arthralgias (right shoulder, right hip, left sholder) and gait problem (pain right).    Past Medical History  Diagnosis Date  . PSVT (paroxysmal supraventricular tachycardia) ([place])   . Ischemic cardiomyopathy     a. Anterior MI 2005 with primary PCI. b. EF 30-35%, s/p prior ICD. June 2014 she had a 6949 lead fractured requiring replacement. At that time she received a new atrial lead 2/2 bradycardia and a new generator.  . Chronic systolic heart failure ([place])   . [id number] lead 09/06/2011  . Coronary artery disease     a. anterior MI 2005. b. 4V CABG 2014.  [name] Kitchen History of blood transfusion     "after heart surgery" (03/21/2013)  . GERD (gastroesophageal reflux disease)   . Myocardial infarction ([place]) 02/18/2004    Archie Endo 02/18/2004 (03/20/2013)  . Automatic implantable cardioverter-defibrillator in situ 06/24/2004    Archie Endo 06/24/2004 (03/21/2013)  . Anemia   . Dyslipidemia     Past Surgical History  Procedure Laterality Date  . Cardiac defibrillator placement      Medtronic  . Intraoperative transesophageal  echocardiogram  11/03/2012    Procedure: INTRAOPERATIVE TRANSESOPHAGEAL ECHOCARDIOGRAM;  Surgeon: Gaye Pollack, MD;  Location: [facility] OR;  Service: Open Heart Surgery;  Laterality: N/A;  . Coronary artery bypass graft  11/03/2012    Procedure: CORONARY ARTERY BYPASS GRAFTING (CABG);  Surgeon: Gaye Pollack, MD;  Location: [place];  Service: Open Heart Surgery;  Laterality: N/A;  . Cataract extraction w/ intraocular lens  implant, bilateral Bilateral ~2008  . Knee arthroscopy Left 02/19/2010    Archie Endo 02/19/2010 (03/21/2013)  . Coronary angioplasty  02/18/2004    Archie Endo 02/18/2004 (03/21/2013)  . Colonoscopy N/A 02/26/2014    SLF: 1. Normal mucosa in teh termianl ileum 2. Two colon polyps  removed 3. The left colon in redundant 4. Rectal bleeding due to Moderate sized internal hemorroids   . Left and right heart catheterization with coronary angiogram N/A 10/30/2012    Procedure: LEFT AND RIGHT HEART CATHETERIZATION WITH CORONARY ANGIOGRAM;  Surgeon: Sherren Mocha, MD;  Location: [facility] CATH LAB;  Service: Cardiovascular;  Laterality: N/A;  . Lead revision N/A 03/22/2013    Procedure: LEAD REVISION;  Surgeon: Evans Lance, MD;  Location: [facility] CATH LAB;  Service: Cardiovascular;  Laterality: N/A;    Family History  Problem Relation Age of Onset  . Colon cancer Neg Hx   . Heart attack Mother   . Hypertension Father   . Heart attack Sister   . Heart attack Brother  Social History Social History  Substance Use Topics  . Smoking status: Never Smoker   . Smokeless tobacco: Never Used  . Alcohol Use: No    No Known Allergies  Current Outpatient Prescriptions  Medication Sig Dispense Refill  . aspirin EC 81 MG tablet Take 81 mg by mouth daily. Reported on 12/03/2015    . benzonatate (TESSALON) 100 MG capsule Take 1 capsule (100 mg total) by mouth 3 (three) times daily as needed for cough. 20 capsule 0  . carvedilol (COREG) 6.25 MG tablet TAKE ONE-HALF TABLET IN THE MORNING AND ONE TABLET IN THE EVENING 45  tablet 3  . clopidogrel (PLAVIX) 75 MG tablet TAKE ONE TABLET BY MOUTH DAILY. STOP OMEPRAZOLE. 30 tablet 6  . diclofenac sodium (VOLTAREN) 1 % GEL Apply 1 application topically daily as needed (for pain). As needed    . esomeprazole (NEXIUM) 40 MG capsule Take 40 mg by mouth daily at 12 noon.    . furosemide (LASIX) 20 MG tablet Take 2 tablets (40 mg total) by mouth daily. 60 tablet 6  . hydrocortisone 2.5 % cream APPLY TO RECTUM TWICE DAILY FOR 7 DAYS 30 g 1  . lidocaine (XYLOCAINE) 5 % ointment APPLY TO RECTUM FOUR TIMES DAILY FOR 7 DAYS TO HELP RECTAL PAIN 35.44 g 1  . metFORMIN (GLUCOPHAGE) 500 MG tablet Take 500 mg by mouth daily.    . Multiple Vitamin (MULTIVITAMIN WITH MINERALS) TABS tablet Take 1 tablet by mouth daily.    [name] Kitchen NITROSTAT 0.4 MG SL tablet PLACE ONE TABLET UNDER THE TONGUE EVERY FIVE MINUTES AS NEEDED FOR CHEST PAIN 25 tablet 5  . potassium chloride SA (K-DUR,KLOR-CON) 20 MEQ tablet TAKE ONE (1) TABLET BY MOUTH EVERY DAY 30 tablet 11  . predniSONE (STERAPRED UNI-PAK 21 TAB) 5 MG (21) TBPK tablet Take 1 tablet (5 mg total) by mouth daily. Take as directed 1 tablet 0  . simvastatin (ZOCOR) 20 MG tablet TAKE ONE TABLET TWO TIMES PER WEEK 8 tablet 6   No current facility-administered medications for this visit.     Physical Exam  Blood pressure 131/62, pulse 62, temperature 97.3 F (36.3 C), resp. rate 16, height 5' (1.524 m), weight 118 lb (53.524 kg).  Constitutional: overall normal hygiene, normal nutrition, well developed, normal grooming, normal body habitus. Assistive device:none  Musculoskeletal: gait and station Limp none, muscle tone and strength are normal, no tremors or atrophy is present.  .  Neurological: coordination overall normal.  Deep tendon reflex/nerve stretch intact.  Sensation normal.  Cranial nerves II-XII intact.   Skin:   normal overall no scars, lesions, ulcers or rashes. No psoriasis.  Psychiatric: Alert and oriented x 3.  Recent memory  intact, remote memory unclear.  Normal mood and affect. Well groomed.  Good eye contact.  Cardiovascular: overall no swelling, no varicosities, no edema bilaterally, normal temperatures of the legs and arms, no clubbing, cyanosis and good capillary refill.  Lymphatic: palpation is normal.  Spine/Pelvis examination:  Inspection:  Overall, sacoiliac joint benign and hips nontender; without crepitus or defects.   Thoracic spine inspection: Alignment normal without kyphosis present   Lumbar spine inspection:  Alignment  with normal lumbar lordosis, without scoliosis apparent.   Thoracic spine palpation:  with tenderness of spinal processes   Lumbar spine palpation: with tenderness of lumbar area; without tightness of lumbar muscles    Range of Motion:   Lumbar flexion, forward flexion is 45  without pain or tenderness    Lumbar extension  is 10  without pain or tenderness   Left lateral bend is Normal  without pain or tenderness   Right lateral bend is Normal without pain or tenderness   Straight leg raising is Normal   Strength & tone: Normal   Stability overall normal stability   The patient has been educated about the nature of the problem(s) and counseled on treatment options.  The patient appeared to understand what I have discussed and is in agreement with it.  PLAN Call if any problems.  Precautions discussed.  Continue current medications.   Return to clinic PRN

## 2016-01-05 ENCOUNTER — Other Ambulatory Visit: Payer: Self-pay | Admitting: Physician Assistant

## 2016-01-28 ENCOUNTER — Ambulatory Visit (INDEPENDENT_AMBULATORY_CARE_PROVIDER_SITE_OTHER): Payer: Medicare Other | Admitting: *Deleted

## 2016-01-28 ENCOUNTER — Encounter: Payer: Self-pay | Admitting: Internal Medicine

## 2016-01-28 DIAGNOSIS — I255 Ischemic cardiomyopathy: Secondary | ICD-10-CM | POA: Diagnosis not present

## 2016-01-28 DIAGNOSIS — I5022 Chronic systolic (congestive) heart failure: Secondary | ICD-10-CM

## 2016-01-28 LAB — CUP PACEART INCLINIC DEVICE CHECK
Brady Statistic AP VS Percent: 19.9 %
Brady Statistic AS VP Percent: 0.1 % — CL
Brady Statistic AS VS Percent: 80.1 %
Date Time Interrogation Session: 20170412132550
HIGH POWER IMPEDANCE MEASURED VALUE: 59 Ohm
Implantable Lead Implant Date: 20140605
Implantable Lead Location: 753860
Implantable Lead Model: 6935
Lead Channel Impedance Value: 418 Ohm
Lead Channel Pacing Threshold Amplitude: 0.75 V
Lead Channel Pacing Threshold Amplitude: 1 V
Lead Channel Sensing Intrinsic Amplitude: 1.1 mV
Lead Channel Sensing Intrinsic Amplitude: 18.9 mV
Lead Channel Setting Sensing Sensitivity: 0.3 mV
MDC IDC LEAD IMPLANT DT: 20140605
MDC IDC LEAD LOCATION: 753859
MDC IDC MSMT BATTERY REMAINING LONGEVITY: 98 mo
MDC IDC MSMT LEADCHNL RA IMPEDANCE VALUE: 456 Ohm
MDC IDC MSMT LEADCHNL RA PACING THRESHOLD PULSEWIDTH: 0.4 ms
MDC IDC MSMT LEADCHNL RV PACING THRESHOLD PULSEWIDTH: 0.4 ms
MDC IDC SET LEADCHNL RA PACING AMPLITUDE: 2 V
MDC IDC SET LEADCHNL RV PACING AMPLITUDE: 2.5 V
MDC IDC SET LEADCHNL RV PACING PULSEWIDTH: 0.4 ms
MDC IDC STAT BRADY AP VP PERCENT: 0.1 % — AB

## 2016-01-28 NOTE — Progress Notes (Signed)
ICD check in clinic. Normal device function. Thresholds and sensing consistent with previous device measurements. Impedance trends stable over time. (1) NSVT episode x 10 bts at 109/190 bpm. No mode switches. Stable thoracic impedance. Histogram distribution appropriate for patient and level of activity. No changes made this session. Device programmed at appropriate safety margins. Device programmed to optimize intrinsic conduction. Estimated longevity 8.2 years. Patient will follow up with the Device Clinic/R in 3 months and with SK in 06-2016.

## 2016-02-04 ENCOUNTER — Other Ambulatory Visit: Payer: Self-pay | Admitting: Nurse Practitioner

## 2016-02-19 ENCOUNTER — Telehealth: Payer: Self-pay

## 2016-02-19 NOTE — Telephone Encounter (Signed)
Received a fax from the pharmacy stating pt needed PA for lidocaine ointment. The PA has been denied by the insurance co. They said that hemorrhoids was not an FDA approved indication for lidocaine ointment. It will need an appeal letter.

## 2016-02-19 NOTE — Telephone Encounter (Addendum)
PLEASE CALL PT'S INSURANCE COMPANY/PHARMACY. PT DOES HAVE PAINFUL EXTERNAL HEMORRHOIDS. SHE DOESN'T HAVE A FISSURE.

## 2016-02-23 NOTE — Telephone Encounter (Signed)
Routing to the refill box. I still will need an appeal letter to appeal this.

## 2016-02-25 NOTE — Telephone Encounter (Signed)
I have never seen this patient. I looked throughout her medical records and cannot find diagnosis of fissure or documentation of one (I could have overlooked). I would not feel comfortable writing an appeals letter for fissure without documentation. Please address with Dr. Oneida Alar.

## 2016-02-25 NOTE — Telephone Encounter (Signed)
Dr.Fields, can you do an appeal letter for me to send to the insurance company?

## 2016-03-04 ENCOUNTER — Other Ambulatory Visit: Payer: Self-pay | Admitting: Gastroenterology

## 2016-03-07 NOTE — Telephone Encounter (Signed)
lidocaine (XYLOCAINE) 5 % ointment (Discontinued) 35.44 g 3 07/10/2015 11/08/2015    APPLY TO RECTUM FOUR TIMES DAILY FOR 7 DAYS TO HELP RECTAL PAIN   PT NEEDS RX FOR LIDOCAINE. WILL DRAFT APPEAL LETTER.

## 2016-04-07 ENCOUNTER — Other Ambulatory Visit: Payer: Self-pay | Admitting: Internal Medicine

## 2016-05-04 ENCOUNTER — Other Ambulatory Visit: Payer: Self-pay | Admitting: Nurse Practitioner

## 2016-05-06 ENCOUNTER — Encounter: Payer: Self-pay | Admitting: Internal Medicine

## 2016-05-06 ENCOUNTER — Ambulatory Visit (INDEPENDENT_AMBULATORY_CARE_PROVIDER_SITE_OTHER): Payer: Medicare Other | Admitting: *Deleted

## 2016-05-06 DIAGNOSIS — I255 Ischemic cardiomyopathy: Secondary | ICD-10-CM

## 2016-05-06 DIAGNOSIS — I5022 Chronic systolic (congestive) heart failure: Secondary | ICD-10-CM

## 2016-05-06 DIAGNOSIS — I471 Supraventricular tachycardia, unspecified: Secondary | ICD-10-CM

## 2016-05-06 LAB — CUP PACEART INCLINIC DEVICE CHECK
Brady Statistic AP VS Percent: 16.93 %
Brady Statistic AS VS Percent: 83.04 %
Date Time Interrogation Session: 20170720152916
HighPow Impedance: 55 Ohm
Implantable Lead Location: 753859
Implantable Lead Location: 753860
Implantable Lead Model: 6935
Lead Channel Impedance Value: 399 Ohm
Lead Channel Impedance Value: 456 Ohm
Lead Channel Pacing Threshold Amplitude: 0.625 V
Lead Channel Pacing Threshold Pulse Width: 0.4 ms
Lead Channel Sensing Intrinsic Amplitude: 1 mV
Lead Channel Sensing Intrinsic Amplitude: 1.25 mV
Lead Channel Sensing Intrinsic Amplitude: 13.375 mV
Lead Channel Setting Pacing Amplitude: 2.5 V
Lead Channel Setting Pacing Pulse Width: 0.4 ms
MDC IDC LEAD IMPLANT DT: 20140605
MDC IDC LEAD IMPLANT DT: 20140605
MDC IDC MSMT BATTERY REMAINING LONGEVITY: 96 mo
MDC IDC MSMT BATTERY VOLTAGE: 3.01 V
MDC IDC MSMT LEADCHNL RA PACING THRESHOLD AMPLITUDE: 0.875 V
MDC IDC MSMT LEADCHNL RA PACING THRESHOLD PULSEWIDTH: 0.4 ms
MDC IDC MSMT LEADCHNL RV IMPEDANCE VALUE: 304 Ohm
MDC IDC MSMT LEADCHNL RV SENSING INTR AMPL: 18 mV
MDC IDC SET LEADCHNL RA PACING AMPLITUDE: 2 V
MDC IDC SET LEADCHNL RV SENSING SENSITIVITY: 0.3 mV
MDC IDC STAT BRADY AP VP PERCENT: 0.03 %
MDC IDC STAT BRADY AS VP PERCENT: 0.01 %
MDC IDC STAT BRADY RA PERCENT PACED: 16.96 %
MDC IDC STAT BRADY RV PERCENT PACED: 0.04 %

## 2016-05-06 NOTE — Progress Notes (Signed)
ICD check in clinic. Normal device function. Thresholds and sensing consistent with previous device measurements. Impedance trends stable over time. (3) "ventricular arrhythmias"---max dur. 1 sec, Max Avg V 194--(2)AT/(1)VT-NS per EGMs. No mode switches. Histogram distribution appropriate for patient and level of activity. Stable thoracic impedance. No changes made this session. Device programmed at appropriate safety margins. Device programmed to optimize intrinsic conduction. Estimated longevity 8 years. Pt will follow up with SK on 07/20/2016.

## 2016-05-12 ENCOUNTER — Other Ambulatory Visit: Payer: Self-pay | Admitting: *Deleted

## 2016-05-12 MED ORDER — SIMVASTATIN 20 MG PO TABS: ORAL_TABLET | ORAL | 3 refills | Status: DC

## 2016-05-27 NOTE — Telephone Encounter (Signed)
PLEASE CALL PT. ASK IF SHE WOULD STILL LIKE FOR [place] TO SEND AN APPEAL TO HER INSURANCE COMPANY.

## 2016-06-04 NOTE — Telephone Encounter (Signed)
Tried to call pt- NA-LM 

## 2016-06-10 NOTE — Telephone Encounter (Signed)
Tried to call pt- NA 

## 2016-06-18 NOTE — Telephone Encounter (Signed)
Tried to call pt- NA- unable to reach pt.

## 2016-06-29 ENCOUNTER — Encounter: Payer: Self-pay | Admitting: Internal Medicine

## 2016-07-05 ENCOUNTER — Other Ambulatory Visit: Payer: Self-pay | Admitting: Internal Medicine

## 2016-07-05 ENCOUNTER — Other Ambulatory Visit: Payer: Self-pay | Admitting: Nurse Practitioner

## 2016-07-05 ENCOUNTER — Other Ambulatory Visit: Payer: Self-pay | Admitting: Physician Assistant

## 2016-07-06 ENCOUNTER — Other Ambulatory Visit: Payer: Self-pay

## 2016-07-06 MED ORDER — CARVEDILOL 6.25 MG PO TABS: ORAL_TABLET | ORAL | 0 refills | Status: DC

## 2016-07-09 ENCOUNTER — Encounter: Payer: Self-pay | Admitting: Internal Medicine

## 2016-07-20 ENCOUNTER — Encounter: Payer: Self-pay | Admitting: Internal Medicine

## 2016-07-27 ENCOUNTER — Encounter: Payer: Self-pay | Admitting: Internal Medicine

## 2016-07-27 ENCOUNTER — Ambulatory Visit (INDEPENDENT_AMBULATORY_CARE_PROVIDER_SITE_OTHER): Payer: Medicare Other | Admitting: Internal Medicine

## 2016-07-27 VITALS — BP 116/90 | HR 66 | Ht 60.0 in | Wt 122.6 lb

## 2016-07-27 DIAGNOSIS — Z23 Encounter for immunization: Secondary | ICD-10-CM | POA: Diagnosis not present

## 2016-07-27 DIAGNOSIS — I471 Supraventricular tachycardia: Secondary | ICD-10-CM | POA: Diagnosis not present

## 2016-07-27 DIAGNOSIS — Z95810 Presence of automatic (implantable) cardiac defibrillator: Secondary | ICD-10-CM | POA: Diagnosis not present

## 2016-07-27 DIAGNOSIS — I255 Ischemic cardiomyopathy: Secondary | ICD-10-CM

## 2016-07-27 DIAGNOSIS — I2589 Other forms of chronic ischemic heart disease: Secondary | ICD-10-CM

## 2016-07-27 DIAGNOSIS — I5022 Chronic systolic (congestive) heart failure: Secondary | ICD-10-CM

## 2016-07-27 LAB — CUP PACEART INCLINIC DEVICE CHECK
Battery Remaining Longevity: 93 mo
Battery Voltage: 3.01 V
Brady Statistic AP VP Percent: 0.03 %
Brady Statistic AP VS Percent: 19.24 %
Brady Statistic AS VP Percent: 0.01 %
Brady Statistic AS VS Percent: 80.73 %
Brady Statistic RA Percent Paced: 19.27 %
Brady Statistic RV Percent Paced: 0.04 %
Date Time Interrogation Session: 20171010165038
HighPow Impedance: 54 Ohm
Implantable Lead Implant Date: 20140605
Implantable Lead Implant Date: 20140605
Implantable Lead Location: 753859
Implantable Lead Location: 753860
Implantable Lead Model: 5076
Implantable Lead Model: 6935
Lead Channel Impedance Value: 342 Ohm
Lead Channel Impedance Value: 456 Ohm
Lead Channel Impedance Value: 456 Ohm
Lead Channel Pacing Threshold Amplitude: 0.625 V
Lead Channel Pacing Threshold Amplitude: 0.875 V
Lead Channel Pacing Threshold Pulse Width: 0.4 ms
Lead Channel Pacing Threshold Pulse Width: 0.4 ms
Lead Channel Sensing Intrinsic Amplitude: 1.25 mV
Lead Channel Sensing Intrinsic Amplitude: 19.125 mV
Lead Channel Setting Pacing Amplitude: 2 V
Lead Channel Setting Pacing Amplitude: 2.5 V
Lead Channel Setting Pacing Pulse Width: 0.4 ms
Lead Channel Setting Sensing Sensitivity: 0.3 mV

## 2016-07-27 NOTE — Progress Notes (Signed)
Patient Care Team: [name], MD as PCP - General (Internal Medicine) Danie Binder, MD as Attending Physician (Gastroenterology)   HPI  [name] is a 75 y.o. female Seen in followup for congestive heart failure in the setting of ischemic cardiomyopathy with previously implanted ICD.  20.13 LHC >>  catheterization. This demonstrated 70% proximal right 80% proximal LAD and a 95% left main. EF was 35% and she underwent bypass surgery  2016 Myoview EF 23% large scar without ischemia   Of June 2014 her 6949 lead fractured requiring replacement. At that time she received a new atrial lead 2/2 bradycardia and a new generator.      She has multiple aches and pains. She denies chest heaviness. There is chronic shortness of breath and fatigued.  KAM CHO=HELLO ough JO = GOOD BYE ABAR=- thank you    Past Medical History:  Diagnosis Date  . [id number] lead 09/06/2011  . Anemia   . Automatic implantable cardioverter-defibrillator in situ 06/24/2004   Archie Endo 06/24/2004 (03/21/2013)  . Chronic systolic heart failure ([place])   . Coronary artery disease    a. anterior MI 2005. b. 4V CABG 2014.  [name] Kitchen Dyslipidemia   . GERD (gastroesophageal reflux disease)   . History of blood transfusion    "after heart surgery" (03/21/2013)  . Ischemic cardiomyopathy    a. Anterior MI 2005 with primary PCI. b. EF 30-35%, s/p prior ICD. June 2014 she had a 6949 lead fractured requiring replacement. At that time she received a new atrial lead 2/2 bradycardia and a new generator.  . Myocardial infarction 02/18/2004   Archie Endo 02/18/2004 (03/20/2013)  . PSVT (paroxysmal supraventricular tachycardia) ([place])     Past Surgical History:  Procedure Laterality Date  . CARDIAC DEFIBRILLATOR PLACEMENT     Medtronic  . CATARACT EXTRACTION W/ INTRAOCULAR LENS  IMPLANT, BILATERAL Bilateral ~2008  . COLONOSCOPY N/A 02/26/2014   SLF: 1. Normal mucosa in teh termianl ileum 2. Two colon polyps  removed 3. The left colon in  redundant 4. Rectal bleeding due to Moderate sized internal hemorroids   . CORONARY ANGIOPLASTY  02/18/2004   Archie Endo 02/18/2004 (03/21/2013)  . CORONARY ARTERY BYPASS GRAFT  11/03/2012   Procedure: CORONARY ARTERY BYPASS GRAFTING (CABG);  Surgeon: Gaye Pollack, MD;  Location: [place];  Service: Open Heart Surgery;  Laterality: N/A;  . INTRAOPERATIVE TRANSESOPHAGEAL ECHOCARDIOGRAM  11/03/2012   Procedure: INTRAOPERATIVE TRANSESOPHAGEAL ECHOCARDIOGRAM;  Surgeon: Gaye Pollack, MD;  Location: [place] OR;  Service: Open Heart Surgery;  Laterality: N/A;  . KNEE ARTHROSCOPY Left 02/19/2010   Archie Endo 02/19/2010 (03/21/2013)  . LEAD REVISION N/A 03/22/2013   Procedure: LEAD REVISION;  Surgeon: Evans Lance, MD;  Location: [facility] CATH LAB;  Service: Cardiovascular;  Laterality: N/A;  . LEFT AND RIGHT HEART CATHETERIZATION WITH CORONARY ANGIOGRAM N/A 10/30/2012   Procedure: LEFT AND RIGHT HEART CATHETERIZATION WITH CORONARY ANGIOGRAM;  Surgeon: Sherren Mocha, MD;  Location: [facility] CATH LAB;  Service: Cardiovascular;  Laterality: N/A;    Current Outpatient Prescriptions  Medication Sig Dispense Refill  . aspirin EC 81 MG tablet Take 81 mg by mouth daily. Reported on 12/03/2015    . benzonatate (TESSALON) 100 MG capsule Take 1 capsule (100 mg total) by mouth 3 (three) times daily as needed for cough. 20 capsule 0  . carvedilol (COREG) 6.25 MG tablet Needs appointment with Ivar Drape or D Dunn [phone] 1/2 tab in am, 1 tab in pm 45 tablet 0  .  clopidogrel (PLAVIX) 75 MG tablet TAKE ONE TABLET BY MOUTH EVERY DAY. STOPOMEPRAZOLE. 30 tablet 0  . diclofenac sodium (VOLTAREN) 1 % GEL Apply 1 application topically daily as needed (for pain). As needed    . esomeprazole (NEXIUM) 40 MG capsule Take 40 mg by mouth daily at 12 noon.    . furosemide (LASIX) 20 MG tablet Take 2 tablets (40 mg total) by mouth daily. Needs appointment?Dr. [name] or D Dunn 60 tablet 0  . hydrocortisone 2.5 % cream APPLY TO RECTUM TWICE DAILY FOR 7 DAYS 30 g 1  .  lidocaine (XYLOCAINE) 5 % ointment APPLY TOPICALLY TO RECTUM FOUR TIMES DAILY FOR 7 DAYS TO HELP WITH RECTAL PAIN 35.44 g 0  . metFORMIN (GLUCOPHAGE) 500 MG tablet Take 500 mg by mouth daily. Reported on 01/28/2016    . Multiple Vitamin (MULTIVITAMIN WITH MINERALS) TABS tablet Take 1 tablet by mouth daily.    [name] Kitchen NITROSTAT 0.4 MG SL tablet PLACE ONE TABLET UNDER THE TONGUE EVERY FIVE MINUTES AS NEEDED FOR CHEST PAIN 25 tablet 5  . potassium chloride SA (K-DUR,KLOR-CON) 20 MEQ tablet TAKE ONE (1) TABLET BY MOUTH EVERY DAY 30 tablet 11  . predniSONE (STERAPRED UNI-PAK 21 TAB) 5 MG (21) TBPK tablet Take 1 tablet (5 mg total) by mouth daily. Take as directed 1 tablet 0   No current facility-administered medications for this visit.     No Known Allergies  Review of Systems negative except from HPI and PMH  Physical Exam BP 116/90   Pulse 66   Ht 5' (1.524 m)   Wt 122 lb 9.6 oz (55.6 kg)   SpO2 98%   BMI 23.94 kg/m  Well developed and well nourished in no acute distress HENT normal E scleral and icterus clear Neck Supple JVP flat; carotids brisk and full Clear to ausculation Device pocket without tenderness but not erythematous *Regular rate and rhythm, no murmurs gallops or rub Soft with active bowel sounds No clubbing cyanosis  Edema  discolored nails-black Alert and oriented, grossly normal motor and sensory function Skin Warm and Dry  ECG demonstrates sinus rhythm at 75    Assessment and  Plan  Ischemic cardiomyopathy  ICD-Medtronic ,The patient's device was interrogated.  The information was reviewed. No changes were made in the programming.     Atrial tachycardia  detected on her device. Nonsustained.  Systolic heart failure chronic   mylagias  Without symptoms of ischemia  Euvolemic continue current meds  I wonder whether her myalgias may be related to  her low dose statin. I will have her hold that until she sees Dr.Vyas  next week and let him decide as to whether  to resume it or not based on her symptoms

## 2016-07-27 NOTE — Patient Instructions (Signed)
Medication Instructions: - Your physician has recommended you make the following change in your medication:  1) Stop zocor (simvastatin)   Labwork: - none ordered  Procedures/Testing: - none ordered  Follow-Up: - Remote monitoring is used to monitor your Pacemaker of ICD from home. This monitoring reduces the number of office visits required to check your device to one time per year. It allows [place] to keep an eye on the functioning of your device to ensure it is working properly. You are scheduled for a device check from home on 10/26/16. You may send your transmission at any time that day. If you have a wireless device, the transmission will be sent automatically. After your physician reviews your transmission, you will receive a postcard with your next transmission date.  - Your physician wants you to follow-up in: 1 year with Dr. [name]. You will receive a reminder letter in the mail two months in advance. If you don't receive a letter, please call our office to schedule the follow-up appointment.  Any Additional Special Instructions Will Be Listed Below (If Applicable).     If you need a refill on your cardiac medications before your next appointment, please call your pharmacy.

## 2016-08-04 ENCOUNTER — Other Ambulatory Visit: Payer: Self-pay | Admitting: Internal Medicine

## 2016-08-05 ENCOUNTER — Encounter: Payer: Self-pay | Admitting: *Deleted

## 2016-08-09 ENCOUNTER — Other Ambulatory Visit: Payer: Self-pay | Admitting: Internal Medicine

## 2016-09-03 ENCOUNTER — Other Ambulatory Visit: Payer: Self-pay | Admitting: Nurse Practitioner

## 2016-10-04 ENCOUNTER — Other Ambulatory Visit: Payer: Self-pay | Admitting: Gastroenterology

## 2016-10-26 ENCOUNTER — Encounter: Payer: Medicare Other | Admitting: *Deleted

## 2016-10-26 ENCOUNTER — Telehealth: Payer: Self-pay | Admitting: Cardiology

## 2016-10-26 NOTE — Telephone Encounter (Signed)
Spoke with pt and reminded pt of remote transmission that is due today. Pt verbalized understanding.   

## 2016-10-29 ENCOUNTER — Encounter: Payer: Self-pay | Admitting: Cardiology

## 2016-10-29 NOTE — Progress Notes (Signed)
Letter  

## 2016-11-02 ENCOUNTER — Other Ambulatory Visit: Payer: Self-pay | Admitting: Gastroenterology

## 2016-11-02 NOTE — Telephone Encounter (Signed)
Pt's husband is aware and she is scheduled to see Dr. Oneida Alar on 11/04/2016 at 2:00 pm.

## 2016-11-02 NOTE — Telephone Encounter (Signed)
Patient needs OV before further refills of anusol. She is getting monthly refills and this should not be used long term. Last seen 2016.   Please schedule ov with slf only.

## 2016-11-04 ENCOUNTER — Ambulatory Visit: Payer: Self-pay | Admitting: Gastroenterology

## 2016-11-11 ENCOUNTER — Ambulatory Visit: Payer: Self-pay | Admitting: Gastroenterology

## 2016-11-25 ENCOUNTER — Ambulatory Visit (INDEPENDENT_AMBULATORY_CARE_PROVIDER_SITE_OTHER): Payer: Medicare Other | Admitting: Gastroenterology

## 2016-11-25 DIAGNOSIS — K648 Other hemorrhoids: Secondary | ICD-10-CM

## 2016-11-25 DIAGNOSIS — K644 Residual hemorrhoidal skin tags: Secondary | ICD-10-CM | POA: Diagnosis not present

## 2016-11-25 MED ORDER — HYDROCORTISONE 2.5 % EX CREA: TOPICAL_CREAM | CUTANEOUS | 2 refills | Status: DC

## 2016-11-25 NOTE — Patient Instructions (Signed)
USE CREAM ONCE DAILY.  DRINK WATER TO KEEP YOUR URINE LIGHT YELLOW.  FOLLOW UP IN 6 MOS.

## 2016-11-25 NOTE — Progress Notes (Signed)
ON RECALL  °

## 2016-11-25 NOTE — Assessment & Plan Note (Addendum)
SYMPTOMS FAIRLY WELL CONTROLLED.  USE ANUSOL CREAM ONCE DAILY. CALLED [facility]. PT LAST HAD ANUSOL FILLED DEC 2017. DRINK WATER FOLLOW UP IN 6 MOS.

## 2016-11-25 NOTE — Progress Notes (Signed)
CC'D TO PCP °

## 2016-11-25 NOTE — Progress Notes (Signed)
Subjective:    Patient ID: [name], female    DOB: 1941-04-05, 76 y.o.   MRN: [id number]  [name], MD   HPI EVERYTHING SHE EATS AND IF SPICY BOTHERS BOTTOM(BURNING SENSATION). USING CREAM ON BOTTOM EVERY TIME(ONCE A DAY).  LIMITED DUE TO LANGUAGE BARRIER. PT CAME TO HAVE CREAM REFILLED TODAY.   PT DENIES FEVER, CHILLS, HEMATOCHEZIA, nausea, vomiting, melena, diarrhea,   CHANGE IN BOWEL IN HABITS, constipation, abdominal pain, problems swallowing, OR heartburn or indigestion.  Past Medical History:  Diagnosis Date  . [id number] lead 09/06/2011  . Anemia   . Automatic implantable cardioverter-defibrillator in situ 06/24/2004   Archie Endo 06/24/2004 (03/21/2013)  . Chronic systolic heart failure ([place])   . Coronary artery disease    a. anterior MI 2005. b. 4V CABG 2014.  [name] Kitchen Dyslipidemia   . GERD (gastroesophageal reflux disease)   . History of blood transfusion    "after heart surgery" (03/21/2013)  . Ischemic cardiomyopathy    a. Anterior MI 2005 with primary PCI. b. EF 30-35%, s/p prior ICD. June 2014 she had a 6949 lead fractured requiring replacement. At that time she received a new atrial lead 2/2 bradycardia and a new generator.  . Myocardial infarction 02/18/2004   Archie Endo 02/18/2004 (03/20/2013)  . PSVT (paroxysmal supraventricular tachycardia) ([place])    Past Surgical History:  Procedure Laterality Date  . CARDIAC DEFIBRILLATOR PLACEMENT     Medtronic  . CATARACT EXTRACTION W/ INTRAOCULAR LENS  IMPLANT, BILATERAL Bilateral ~2008  . COLONOSCOPY N/A 02/26/2014   SLF: 1. Normal mucosa in teh termianl ileum 2. Two colon polyps  removed 3. The left colon in redundant 4. Rectal bleeding due to Moderate sized internal hemorroids   . CORONARY ANGIOPLASTY  02/18/2004   Archie Endo 02/18/2004 (03/21/2013)  . CORONARY ARTERY BYPASS GRAFT  11/03/2012   Procedure: CORONARY ARTERY BYPASS GRAFTING (CABG);  Surgeon: Gaye Pollack, MD;  Location: [place];  Service: Open Heart Surgery;  Laterality: N/A;  .  INTRAOPERATIVE TRANSESOPHAGEAL ECHOCARDIOGRAM  11/03/2012   Procedure: INTRAOPERATIVE TRANSESOPHAGEAL ECHOCARDIOGRAM;  Surgeon: Gaye Pollack, MD;  Location: [place] OR;  Service: Open Heart Surgery;  Laterality: N/A;  . KNEE ARTHROSCOPY Left 02/19/2010   Archie Endo 02/19/2010 (03/21/2013)  . LEAD REVISION N/A 03/22/2013   Procedure: LEAD REVISION;  Surgeon: Evans Lance, MD;  Location: [facility] CATH LAB;  Service: Cardiovascular;  Laterality: N/A;  . LEFT AND RIGHT HEART CATHETERIZATION WITH CORONARY ANGIOGRAM N/A 10/30/2012   Procedure: LEFT AND RIGHT HEART CATHETERIZATION WITH CORONARY ANGIOGRAM;  Surgeon: Sherren Mocha, MD;  Location: [facility] CATH LAB;  Service: Cardiovascular;  Laterality: N/A;    No Known Allergies  Current Outpatient Prescriptions  Medication Sig Dispense Refill  . aspirin EC 81 MG tablet Take 81 mg by mouth daily.     . carvedilol (COREG) 6.25 MG tablet TAKE 1/2 TABLET IN THE MORNING AND ONE IN THE AFTERNOON    . clopidogrel (PLAVIX) 75 MG tablet Take 1 tablet (75 mg total) by mouth daily.    . diclofenac sodium (VOLTAREN) 1 % GEL Apply 1 application topically daily as needed (for pain). As needed    . NEXIUM 40 MG capsule Take 40 mg by mouth daily at 12 noon.    . furosemide (LASIX) 20 MG tablet TAKE 2 TABLETS BY MOUTH DAILY.    . hydrocortisone 2.5 % cream APPLY TO RECTUM TWICE DAILY FOR 7 DAYS.    [name] Kitchen metFORMIN (GLUCOPHAGE) 500 MG tablet Take 500 mg by mouth  daily.     . Multiple Vitamin tablet Take 1 tablet  daily.    .      . potassium chloride 20 MEQ tablet TAKE ONE (1) TABLET BY MOUTH EVERY DAY    . TESSALON 100 MG capsule Take 1 capsule TID needed for cough.    . lidocaine (XYLOCAINE) 5 % ointment IF NEEDED    .       Review of Systems PER HPI OTHERWISE ALL SYSTEMS ARE NEGATIVE.    Objective:   Physical Exam  Constitutional: She is oriented to person, place, and time. She appears well-developed and well-nourished. No distress.  HENT:  Head: Normocephalic and atraumatic.    Mouth/Throat: Oropharynx is clear and moist. No oropharyngeal exudate.  Eyes: Pupils are equal, round, and reactive to light. No scleral icterus.  Neck: Normal range of motion. Neck supple.  Cardiovascular: Normal rate, regular rhythm and normal heart sounds.   Pulmonary/Chest: Effort normal and breath sounds normal. No respiratory distress.  Abdominal: Soft. Bowel sounds are normal. She exhibits no distension. There is no tenderness.  Genitourinary: Rectal exam shows external hemorrhoid.     Musculoskeletal: She exhibits no edema.  Lymphadenopathy:    She has no cervical adenopathy.  Neurological: She is alert and oriented to person, place, and time.  NO  NEW FOCAL DEFICITS  Psychiatric: She has a normal mood and affect.  Vitals reviewed.         Assessment & Plan:

## 2016-11-25 NOTE — Assessment & Plan Note (Signed)
SYMPTOMS CONTROLLED/RESOLVED.  CONTINUE TO MONITOR SYMPTOMS. 

## 2016-12-01 ENCOUNTER — Ambulatory Visit: Payer: Self-pay | Admitting: Gastroenterology

## 2016-12-02 ENCOUNTER — Other Ambulatory Visit: Payer: Self-pay | Admitting: Internal Medicine

## 2016-12-09 ENCOUNTER — Encounter: Payer: Self-pay | Admitting: Cardiology

## 2016-12-13 IMAGING — CR DG CHEST 2V
2 series · 2 of 2 positions shown · non-contrast
Comparison: PA and lateral chest x-ray October 25, 2014

CLINICAL DATA: 2-3 months of cough and chest congestion worse with
exertion, history of previous MI with CABG, ICD placement

EXAM:
CHEST  2 VIEW

[w chest pa]
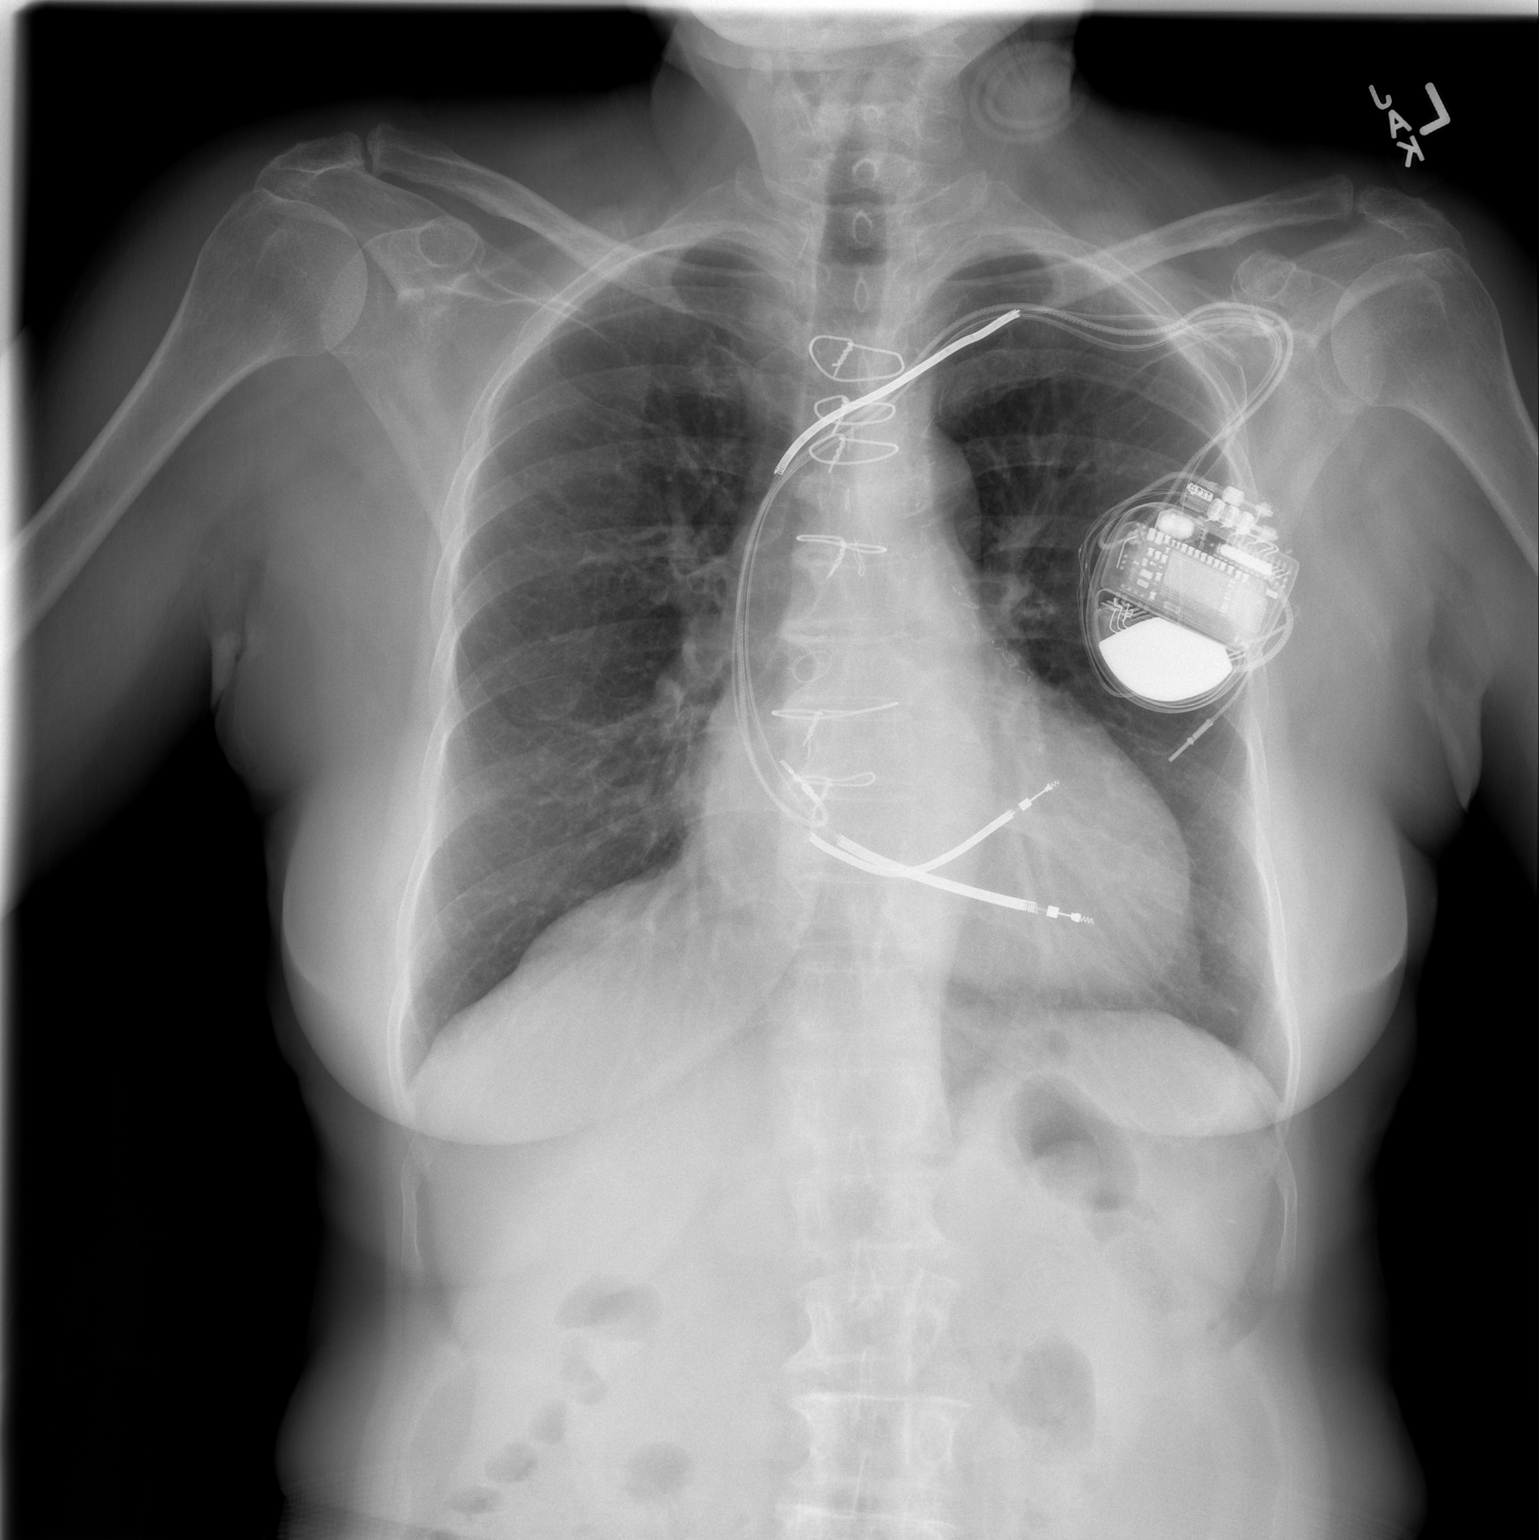

[w chest lat]
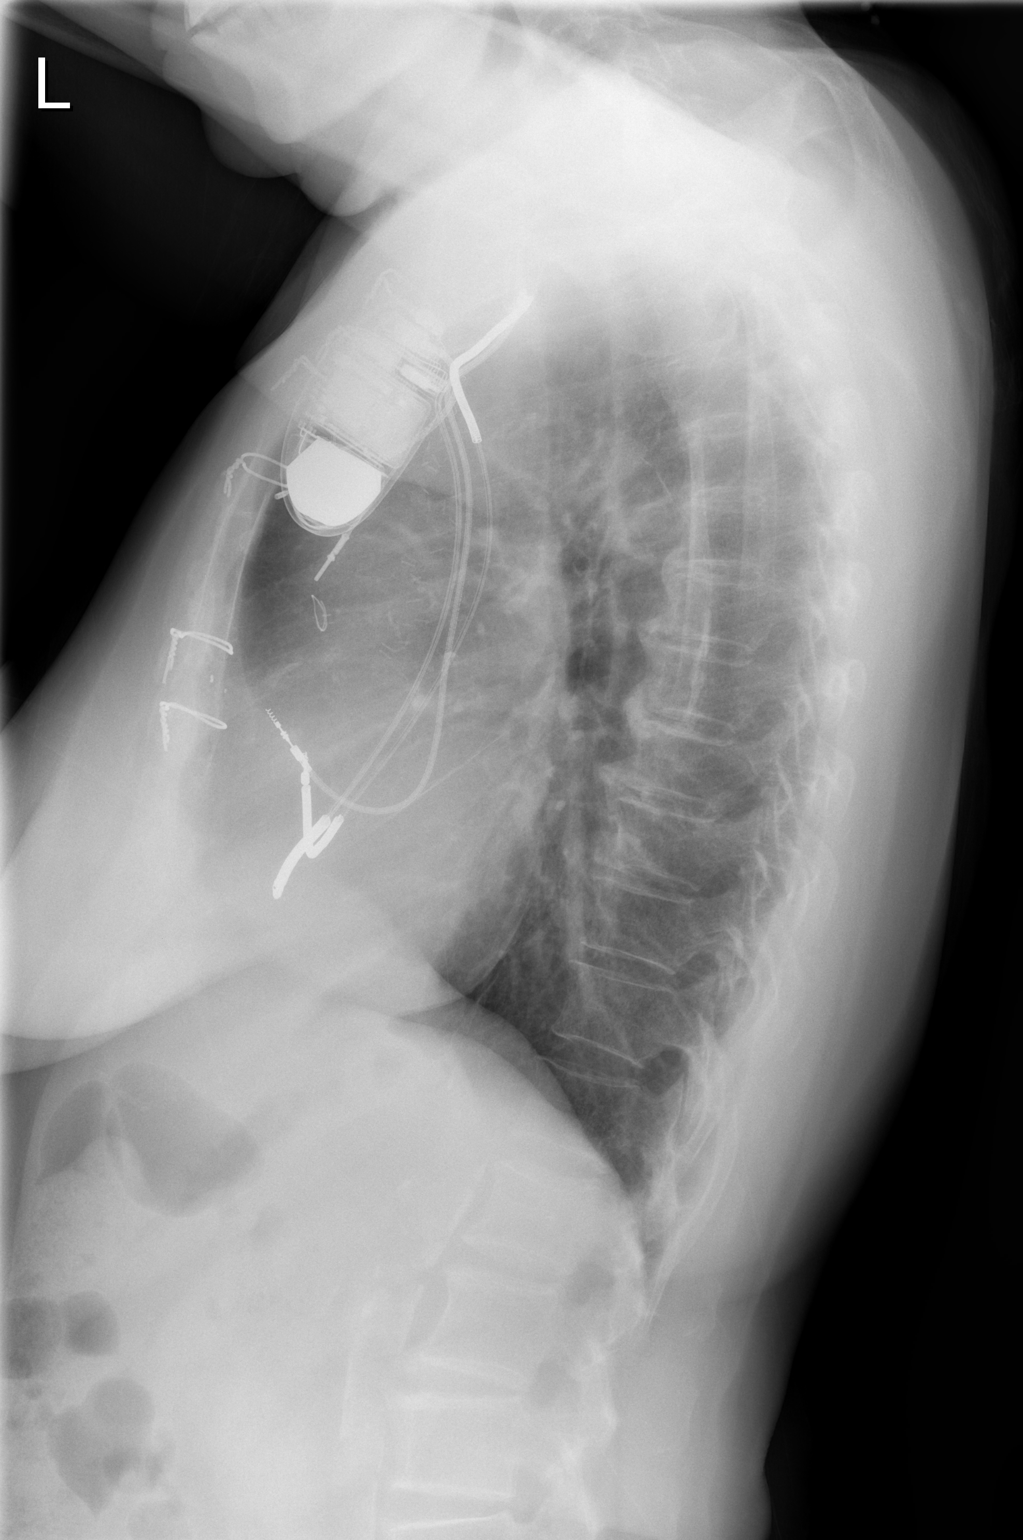

[2 of 2 positions shown; findings below may reference images not displayed]

FINDINGS: The lungs are well-expanded and clear. The heart is top-normal in
size. The pulmonary vascularity is normal. The AICD is in reasonable
position. There are 6 intact sternal wires. There is no pleural
effusion or pneumothorax. There is mild degenerative disc disease of
the mid thoracic spine.
IMPRESSION: There is no pneumonia, CHF, nor other acute cardiopulmonary
abnormality.

## 2017-01-28 ENCOUNTER — Encounter: Payer: Self-pay | Admitting: Cardiology

## 2017-03-02 ENCOUNTER — Other Ambulatory Visit: Payer: Self-pay | Admitting: Gastroenterology

## 2017-04-13 ENCOUNTER — Encounter: Payer: Self-pay | Admitting: Gastroenterology

## 2017-05-02 ENCOUNTER — Other Ambulatory Visit: Payer: Self-pay | Admitting: Gastroenterology

## 2017-05-25 ENCOUNTER — Telehealth: Payer: Self-pay | Admitting: Internal Medicine

## 2017-05-25 NOTE — Telephone Encounter (Signed)
New message  Appt 06/16/17  8a   Pt c/o Shortness Of Breath: STAT if SOB developed within the last 24 hours or pt is noticeably SOB on the phone  1. Are you currently SOB (can you hear that pt is SOB on the phone)? No daughter calling   2. How long have you been experiencing SOB?  Had 2 episodes in the last week   3. Are you SOB when sitting or when up moving around? Daughter doesn't know   4. Are you currently experiencing any other symptoms?  No other symptoms that her daughter know of.

## 2017-05-25 NOTE — Telephone Encounter (Signed)
Contacted patient but was unable to get details of SOB due to patient not speaking english.   Spoke with granddaughter and she said that 2 days last week patient had trouble breathing. No c/o swelling, or problems sleeping in bed and is not having to use extra pillows. No c/o chest pain or dizziness.  Granddaughter said that she called requesting an appointment for patient to be seen but that she has already gotten a sooner appointment for patient.  Nurse advised granddaughter to send a transmission to that it can be reviewed by our office. Granddaughter stated that she wasn't sure if patient had the monitor or knew how to send in the transmission but that she would try to get one sent in.   Granddaughter advised that if patient's symptoms got worse, that she needed to go to the ED for an evaluation. Granddaughter verbalized understanding of plan.

## 2017-05-26 NOTE — Telephone Encounter (Signed)
Discussed with Dr. [name]- ok to add on Monday 05/30/17 at 1:45pm. Pt and granddaughter aware and agreeable.

## 2017-05-30 ENCOUNTER — Ambulatory Visit (INDEPENDENT_AMBULATORY_CARE_PROVIDER_SITE_OTHER): Payer: Medicare Other | Admitting: Internal Medicine

## 2017-05-30 ENCOUNTER — Encounter: Payer: Self-pay | Admitting: *Deleted

## 2017-05-30 VITALS — BP 112/60 | HR 62 | Ht 60.0 in | Wt 122.0 lb

## 2017-05-30 DIAGNOSIS — I471 Supraventricular tachycardia: Secondary | ICD-10-CM

## 2017-05-30 DIAGNOSIS — Z95810 Presence of automatic (implantable) cardiac defibrillator: Secondary | ICD-10-CM | POA: Diagnosis not present

## 2017-05-30 DIAGNOSIS — R0602 Shortness of breath: Secondary | ICD-10-CM

## 2017-05-30 DIAGNOSIS — I255 Ischemic cardiomyopathy: Secondary | ICD-10-CM | POA: Diagnosis not present

## 2017-05-30 DIAGNOSIS — I5022 Chronic systolic (congestive) heart failure: Secondary | ICD-10-CM

## 2017-05-30 DIAGNOSIS — I2589 Other forms of chronic ischemic heart disease: Secondary | ICD-10-CM

## 2017-05-30 LAB — CUP PACEART INCLINIC DEVICE CHECK
Battery Remaining Longevity: 83 mo
Battery Voltage: 3.01 V
Brady Statistic AP VP Percent: 0.03 %
Brady Statistic RA Percent Paced: 24.09 %
Brady Statistic RV Percent Paced: 0.04 %
Date Time Interrogation Session: 20180813180149
HIGH POWER IMPEDANCE MEASURED VALUE: 53 Ohm
Implantable Lead Implant Date: 20140605
Implantable Lead Location: 753860
Implantable Lead Model: 5076
Implantable Lead Model: 6935
Implantable Pulse Generator Implant Date: 20140605
Lead Channel Impedance Value: 342 Ohm
Lead Channel Impedance Value: 418 Ohm
Lead Channel Pacing Threshold Amplitude: 1 V
Lead Channel Sensing Intrinsic Amplitude: 1 mV
Lead Channel Setting Pacing Amplitude: 2 V
Lead Channel Setting Pacing Pulse Width: 0.4 ms
MDC IDC LEAD IMPLANT DT: 20140605
MDC IDC LEAD LOCATION: 753859
MDC IDC MSMT LEADCHNL RA IMPEDANCE VALUE: 418 Ohm
MDC IDC MSMT LEADCHNL RA PACING THRESHOLD PULSEWIDTH: 0.4 ms
MDC IDC MSMT LEADCHNL RV PACING THRESHOLD AMPLITUDE: 0.75 V
MDC IDC MSMT LEADCHNL RV PACING THRESHOLD PULSEWIDTH: 0.4 ms
MDC IDC MSMT LEADCHNL RV SENSING INTR AMPL: 18.625 mV
MDC IDC SET LEADCHNL RV PACING AMPLITUDE: 2.5 V
MDC IDC SET LEADCHNL RV SENSING SENSITIVITY: 0.3 mV
MDC IDC STAT BRADY AP VS PERCENT: 24.16 %
MDC IDC STAT BRADY AS VP PERCENT: 0.01 %
MDC IDC STAT BRADY AS VS PERCENT: 75.8 %

## 2017-05-30 MED ORDER — PYRIDOSTIGMINE BROMIDE 60 MG PO TABS: 30.0000 mg | ORAL_TABLET | Freq: Two times a day (BID) | ORAL | 11 refills | Status: AC

## 2017-05-30 NOTE — Patient Instructions (Signed)
Medication Instructions:  Your physician has recommended you make the following change in your medication:  1. START Mestinon 30 mg twice a day  If you need a refill on your cardiac medications before your next appointment, please call your pharmacy.   Labwork: Today: BMET, CBC w/ diff, TSH & Sedimentation rate  Testing/Procedures: None ordered  Follow-Up: Remote monitoring is used to monitor your Pacemaker of ICD from home. This monitoring reduces the number of office visits required to check your device to one time per year. It allows [place] to keep an eye on the functioning of your device to ensure it is working properly. You are scheduled for a device check from home on 08/29/2017. You may send your transmission at any time that day. If you have a wireless device, the transmission will be sent automatically. After your physician reviews your transmission, you will receive a postcard with your next transmission date.  Your physician wants you to follow-up in: 1 year with Dr. [name].  You will receive a reminder letter in the mail two months in advance. If you don't receive a letter, please call our office to schedule the follow-up appointment.  Thank you for choosing [facility]!!    Any Other Special Instructions Will Be Listed Below (If Applicable).

## 2017-05-30 NOTE — Progress Notes (Signed)
Patient Care Team: [name], MD as PCP - General (Internal Medicine) Danie Binder, MD as Attending Physician (Gastroenterology)   HPI  [name] is a 76 y.o. female Seen in followup for congestive heart failure in the setting of ischemic cardiomyopathy with previously implanted ICD.  20.13 LHC >>  catheterization. This demonstrated 70% proximal right 80% proximal LAD and a 95% left main. EF was 35% and she underwent bypass surgery  2016 Myoview EF 23% large scar without ischemia   Of June 2014 her 6949 lead fractured requiring replacement. At that time she received a new atrial lead 2/2 bradycardia and a new generator.   She has complaints of leg pain  Also c/o shortness of breath without chest tightness; no edema  Tolerating meds with out difficulty     KAM CHO=HELLO ough JO = GOOD BYE ABAR=- thank you    Past Medical History:  Diagnosis Date  . 3151 lead 09/06/2011  . Anemia   . Automatic implantable cardioverter-defibrillator in situ 06/24/2004   Archie Endo 06/24/2004 (03/21/2013)  . Chronic systolic heart failure ([place])   . Coronary artery disease    a. anterior MI 2005. b. 4V CABG 2014.  [name] Kitchen Dyslipidemia   . GERD (gastroesophageal reflux disease)   . History of blood transfusion    "after heart surgery" (03/21/2013)  . Ischemic cardiomyopathy    a. Anterior MI 2005 with primary PCI. b. EF 30-35%, s/p prior ICD. June 2014 she had a 6949 lead fractured requiring replacement. At that time she received a new atrial lead 2/2 bradycardia and a new generator.  . Myocardial infarction ([place]) 02/18/2004   Archie Endo 02/18/2004 (03/20/2013)  . PSVT (paroxysmal supraventricular tachycardia) ([place])     Past Surgical History:  Procedure Laterality Date  . CARDIAC DEFIBRILLATOR PLACEMENT     Medtronic  . CATARACT EXTRACTION W/ INTRAOCULAR LENS  IMPLANT, BILATERAL Bilateral ~2008  . COLONOSCOPY N/A 02/26/2014   SLF: 1. Normal mucosa in teh termianl ileum 2. Two colon polyps   removed 3. The left colon in redundant 4. Rectal bleeding due to Moderate sized internal hemorroids   . CORONARY ANGIOPLASTY  02/18/2004   Archie Endo 02/18/2004 (03/21/2013)  . CORONARY ARTERY BYPASS GRAFT  11/03/2012   Procedure: CORONARY ARTERY BYPASS GRAFTING (CABG);  Surgeon: Gaye Pollack, MD;  Location: [place];  Service: Open Heart Surgery;  Laterality: N/A;  . INTRAOPERATIVE TRANSESOPHAGEAL ECHOCARDIOGRAM  11/03/2012   Procedure: INTRAOPERATIVE TRANSESOPHAGEAL ECHOCARDIOGRAM;  Surgeon: Gaye Pollack, MD;  Location: [place] OR;  Service: Open Heart Surgery;  Laterality: N/A;  . KNEE ARTHROSCOPY Left 02/19/2010   Archie Endo 02/19/2010 (03/21/2013)  . LEAD REVISION N/A 03/22/2013   Procedure: LEAD REVISION;  Surgeon: Evans Lance, MD;  Location[facility] CATH LAB;  Service: Cardiovascular;  Laterality: N/A;  . LEFT AND RIGHT HEART CATHETERIZATION WITH CORONARY ANGIOGRAM N/A 10/30/2012   Procedure: LEFT AND RIGHT HEART CATHETERIZATION WITH CORONARY ANGIOGRAM;  Surgeon: Sherren Mocha, MD;  Location: [place] CATH LAB;  Service: Cardiovascular;  Laterality: N/A;    Current Outpatient Prescriptions  Medication Sig Dispense Refill  . aspirin EC 81 MG tablet Take 81 mg by mouth daily. Reported on 12/03/2015    . benzonatate (TESSALON) 100 MG capsule Take 1 capsule (100 mg total) by mouth 3 (three) times daily as needed for cough. 20 capsule 0  . carvedilol (COREG) 6.25 MG tablet TAKE 1/2 TABLET IN THE MORNING AND ONE IN THE AFTERNOON 45 tablet 11  .  clopidogrel (PLAVIX) 75 MG tablet Take 1 tablet (75 mg total) by mouth daily. 30 tablet 11  . diclofenac sodium (VOLTAREN) 1 % GEL Apply 1 application topically daily as needed (for pain). As needed    . esomeprazole (NEXIUM) 40 MG capsule Take 40 mg by mouth daily at 12 noon.    . furosemide (LASIX) 20 MG tablet TAKE 2 TABLETS BY MOUTH DAILY. 60 tablet 11  . hydrocortisone 2.5 % cream APPLY ONCE DAILY. 30 g 0  . hydrocortisone 2.5 % cream APPLY TO RECTUM TWICE DAILY FOR 7 DAYS 30 g 1    . lidocaine (XYLOCAINE) 5 % ointment APPLY TOPICALLY TO RECTUM FOUR TIMES DAILY FOR 7 DAYS TO HELP WITH RECTAL PAIN 35.44 g 0  . metFORMIN (GLUCOPHAGE) 500 MG tablet Take 500 mg by mouth daily. Reported on 01/28/2016    . Multiple Vitamin (MULTIVITAMIN WITH MINERALS) TABS tablet Take 1 tablet by mouth daily.    [name] Kitchen NITROSTAT 0.4 MG SL tablet PLACE ONE TABLET UNDER THE TONGUE EVERY FIVE MINUTES AS NEEDED FOR CHEST PAIN 25 tablet 5  . potassium chloride SA (K-DUR,KLOR-CON) 20 MEQ tablet Take 1 tablet (20 mEq total) by mouth daily. 30 tablet 9  . predniSONE (STERAPRED UNI-PAK 21 TAB) 5 MG (21) TBPK tablet Take 1 tablet (5 mg total) by mouth daily. Take as directed 1 tablet 0   No current facility-administered medications for this visit.     No Known Allergies  Review of Systems negative except from HPI and PMH  Physical Exam BP 112/60   Pulse 62   Ht 5' (1.524 m)   Wt 122 lb (55.3 kg)   SpO2 98%   BMI 23.83 kg/m  Well developed and nourished in no acute distress HENT normal Neck supple with JVP-flat Carotids brisk and full without bruits Clear Regular rate and rhythm, no murmurs or gallops Abd-soft with active BS without hepatomegaly No Clubbing cyanosis edema Skin-warm and dry A & Oriented  Grossly normal sensory and motor function   ECG demonstrates Atrial pacing 60 14/08/44   Assessment and  Plan  Ischemic cardiomyopathy  ICD-Medtronic ,The patient's device was interrogated.  The information was reviewed. No changes were made in the programming.     Atrial tachycardia     systolic heart failure chronic   VT nonsustained   mylagias  Without symptoms of ischemia  Euvolemic continue current meds  Reviewed meds with her  VT nonsustained  Continue current meds  Will follow frequency on her device

## 2017-05-31 LAB — CBC WITH DIFFERENTIAL/PLATELET
BASOS ABS: 0.1 10*3/uL (ref 0.0–0.2)
Basos: 1 %
EOS (ABSOLUTE): 0.1 10*3/uL (ref 0.0–0.4)
Eos: 2 %
Hematocrit: 31.9 % — ABNORMAL LOW (ref 34.0–46.6)
Hemoglobin: 10.2 g/dL — ABNORMAL LOW (ref 11.1–15.9)
IMMATURE GRANS (ABS): 0 10*3/uL (ref 0.0–0.1)
Immature Granulocytes: 0 %
LYMPHS: 41 %
Lymphocytes Absolute: 1.9 10*3/uL (ref 0.7–3.1)
MCH: 23.5 pg — AB (ref 26.6–33.0)
MCHC: 32 g/dL (ref 31.5–35.7)
MCV: 74 fL — ABNORMAL LOW (ref 79–97)
Monocytes Absolute: 0.3 10*3/uL (ref 0.1–0.9)
Monocytes: 6 %
NEUTROS ABS: 2.3 10*3/uL (ref 1.4–7.0)
NEUTROS PCT: 50 %
PLATELETS: 308 10*3/uL (ref 150–379)
RBC: 4.34 x10E6/uL (ref 3.77–5.28)
RDW: 16.4 % — ABNORMAL HIGH (ref 12.3–15.4)
WBC: 4.6 10*3/uL (ref 3.4–10.8)

## 2017-05-31 LAB — BASIC METABOLIC PANEL
BUN/Creatinine Ratio: 17 (ref 12–28)
BUN: 16 mg/dL (ref 8–27)
CALCIUM: 9.3 mg/dL (ref 8.7–10.3)
CHLORIDE: 105 mmol/L (ref 96–106)
CO2: 17 mmol/L — AB (ref 20–29)
Creatinine, Ser: 0.93 mg/dL (ref 0.57–1.00)
GFR calc non Af Amer: 61 mL/min/{1.73_m2} (ref >59)
GFR, EST AFRICAN AMERICAN: 70 mL/min/{1.73_m2} (ref >59)
GLUCOSE: 89 mg/dL (ref 65–99)
POTASSIUM: 4.6 mmol/L (ref 3.5–5.2)
Sodium: 140 mmol/L (ref 134–144)

## 2017-05-31 LAB — SEDIMENTATION RATE: Sed Rate: 23 mm/hr (ref 0–40)

## 2017-05-31 LAB — TSH: TSH: 2.5 u[IU]/mL (ref 0.450–4.500)

## 2017-06-06 ENCOUNTER — Telehealth: Payer: Self-pay | Admitting: *Deleted

## 2017-06-06 NOTE — Telephone Encounter (Signed)
Granddaughter informed

## 2017-06-06 NOTE — Telephone Encounter (Signed)
-----   Message from Deboraha Sprang, MD sent at 06/04/2017  1:04 PM EDT ----- Please Inform Patient that labs are normal x anemia which is stable  Thanks

## 2017-06-16 ENCOUNTER — Encounter: Payer: Self-pay | Admitting: Nurse Practitioner

## 2017-07-05 ENCOUNTER — Other Ambulatory Visit: Payer: Self-pay | Admitting: Internal Medicine

## 2017-07-05 ENCOUNTER — Other Ambulatory Visit: Payer: Self-pay | Admitting: Nurse Practitioner

## 2017-07-06 ENCOUNTER — Telehealth: Payer: Self-pay

## 2017-07-06 NOTE — Telephone Encounter (Signed)
Called and told Estill Bamberg at [facility] ok for 90 day supply.

## 2017-07-06 NOTE — Telephone Encounter (Signed)
Yes you can have them change to 90 day supply.

## 2017-07-06 NOTE — Telephone Encounter (Signed)
Tammy from [employer] called and said the insurance companies are really pushing for 90 day supply on meds. She wants to know if Roseanne Kaufman, NP will change the prescription on the Hydrocortisone cream to 90 day supply. I just need to call and let her know either way.

## 2017-08-10 ENCOUNTER — Encounter: Payer: Self-pay | Admitting: Gastroenterology

## 2017-08-10 ENCOUNTER — Ambulatory Visit (INDEPENDENT_AMBULATORY_CARE_PROVIDER_SITE_OTHER): Payer: Medicare Other | Admitting: Gastroenterology

## 2017-08-10 DIAGNOSIS — K644 Residual hemorrhoidal skin tags: Secondary | ICD-10-CM | POA: Diagnosis not present

## 2017-08-10 DIAGNOSIS — K648 Other hemorrhoids: Secondary | ICD-10-CM | POA: Diagnosis not present

## 2017-08-10 DIAGNOSIS — I255 Ischemic cardiomyopathy: Secondary | ICD-10-CM

## 2017-08-10 MED ORDER — LIDOCAINE-HYDROCORTISONE ACE 3-2.5 % RE KIT: PACK | RECTAL | 3 refills | Status: AC

## 2017-08-10 NOTE — Assessment & Plan Note (Signed)
SYMPTOMS CONTROLLED/RESOLVED.  CONTINUE TO MONITOR SYMPTOMS. FOLLOW UP IN 6 MOS.  

## 2017-08-10 NOTE — Assessment & Plan Note (Signed)
PRIMARY COMPLAINT IS PAIN AND SYMPTOMS FAIRLY WELL CONTROLLED. NEEDS REFILLS.  DRINK WATER TO KEEP YOUR URINE LIGHT YELLOW. CONTINUE APOTHECARY HEMORRHOID CREAM. CREAMTO BE COMPOUNDED WITHOUT HYDROCORTISONE. CALL WITH QUESTIONS OR CONCERNS. FOLLOW UP IN 6 MOS.

## 2017-08-10 NOTE — Patient Instructions (Signed)
DRINK WATER TO KEEP YOUR URINE LIGHT YELLOW.  CONTINUE HEMORRHOID CREAM.  PLEASE CALL WITH QUESTIONS OR CONCERNS.  FOLLOW UP IN 6 MOS.

## 2017-08-10 NOTE — Progress Notes (Signed)
Subjective:    Patient ID: [name], female    DOB: [birth year], 76 y.o.   MRN: [id number] [name], MD  HPI If eats spicy foods gets burning down there. If no spicy no brunign really. Cream is working.  WANT ME TO COME EAT DINNER AT HER HOUSE. LIMITED DUE TO LANGUAGE BARRIER.  PT DENIES FEVER, CHILLS, HEMATOCHEZIA, HEMATEMESIS, nausea, vomiting, melena, diarrhea, CHEST PAIN, SHORTNESS OF BREATH,  CHANGE IN BOWEL IN HABITS, constipation, abdominal pain, problems swallowing, problems with sedation, heartburn or indigestion.  Past Medical History:  Diagnosis Date  . 0762 lead 09/06/2011  . Anemia   . Automatic implantable cardioverter-defibrillator in situ 06/24/2004   Archie Endo 06/24/2004 (03/21/2013)  . Chronic systolic heart failure ([place])   . Coronary artery disease    a. anterior MI 2005. b. 4V CABG 2014.  [name] Kitchen Dyslipidemia   . GERD (gastroesophageal reflux disease)   . History of blood transfusion    "after heart surgery" (03/21/2013)  . Ischemic cardiomyopathy    a. Anterior MI 2005 with primary PCI. b. EF 30-35%, s/p prior ICD. June 2014 she had a 6949 lead fractured requiring replacement. At that time she received a new atrial lead 2/2 bradycardia and a new generator.  . Myocardial infarction ([place]) 02/18/2004   Archie Endo 02/18/2004 (03/20/2013)  . PSVT (paroxysmal supraventricular tachycardia) ([place])     Past Surgical History:  Procedure Laterality Date  . CARDIAC DEFIBRILLATOR PLACEMENT     Medtronic  . CATARACT EXTRACTION W/ INTRAOCULAR LENS  IMPLANT, BILATERAL Bilateral ~2008  . COLONOSCOPY N/A 02/26/2014   SLF: 1. Normal mucosa in teh termianl ileum 2. Two colon polyps  removed 3. The left colon in redundant 4. Rectal bleeding due to Moderate sized internal hemorroids   . CORONARY ANGIOPLASTY  02/18/2004   Archie Endo 02/18/2004 (03/21/2013)  . CORONARY ARTERY BYPASS GRAFT  11/03/2012   Procedure: CORONARY ARTERY BYPASS GRAFTING (CABG);  Surgeon: Gaye Pollack, MD;  Location: [place];   Service: Open Heart Surgery;  Laterality: N/A;  . INTRAOPERATIVE TRANSESOPHAGEAL ECHOCARDIOGRAM  11/03/2012   Procedure: INTRAOPERATIVE TRANSESOPHAGEAL ECHOCARDIOGRAM;  Surgeon: Gaye Pollack, MD;  Location: [place] OR;  Service: Open Heart Surgery;  Laterality: N/A;  . KNEE ARTHROSCOPY Left 02/19/2010   Archie Endo 02/19/2010 (03/21/2013)  . LEAD REVISION N/A 03/22/2013   Procedure: LEAD REVISION;  Surgeon: Evans Lance, MD;  Location[facility] CATH LAB;  Service: Cardiovascular;  Laterality: N/A;  . LEFT AND RIGHT HEART CATHETERIZATION WITH CORONARY ANGIOGRAM N/A 10/30/2012   Procedure: LEFT AND RIGHT HEART CATHETERIZATION WITH CORONARY ANGIOGRAM;  Surgeon: Sherren Mocha, MD;  Location: [facility] CATH LAB;  Service: Cardiovascular;  Laterality: N/A;    No Known Allergies  Review of Systems PER HPI OTHERWISE ALL SYSTEMS ARE NEGATIVE.    Objective:   Physical Exam  Constitutional: She is oriented to person, place, and time. She appears well-developed and well-nourished. No distress.  HENT:  Head: Normocephalic and atraumatic.  Mouth/Throat: Oropharynx is clear and moist. No oropharyngeal exudate.  Eyes: Pupils are equal, round, and reactive to light. No scleral icterus.  Neck: Normal range of motion. Neck supple.  Cardiovascular: Normal rate, regular rhythm and normal heart sounds.   Pulmonary/Chest: Effort normal and breath sounds normal. No respiratory distress.  Abdominal: Soft. Bowel sounds are normal. She exhibits no distension. There is no tenderness.  Genitourinary: Rectal exam shows guaiac negative stool.     Genitourinary Comments: EXTERNAL HEMORRHOID/SKIN TAG  Musculoskeletal: She exhibits no edema.  Lymphadenopathy:  She has no cervical adenopathy.  Neurological: She is alert and oriented to person, place, and time.  NO FOCAL DEFICITS  Psychiatric: She has a normal mood and affect.  Vitals reviewed.     Assessment & Plan:

## 2017-08-11 NOTE — Progress Notes (Signed)
CC'D TO PCP °

## 2017-08-29 ENCOUNTER — Encounter: Payer: Medicare Other | Admitting: *Deleted

## 2017-08-29 ENCOUNTER — Telehealth: Payer: Self-pay | Admitting: Cardiology

## 2017-08-29 NOTE — Telephone Encounter (Signed)
Confirmed remote transmission w/ pt caregiver.   

## 2017-09-01 ENCOUNTER — Encounter: Payer: Self-pay | Admitting: Cardiology

## 2017-10-07 ENCOUNTER — Ambulatory Visit: Payer: Self-pay | Admitting: Internal Medicine

## 2017-10-18 DEATH — deceased
